# Patient Record
Sex: Female | Born: 1970 | Race: Black or African American | Hispanic: No | Marital: Single | Smoking: Never smoker
Health system: Southern US, Community
[De-identification: ages and names within clinical notes are randomized; demographics above are authoritative.]

## PROBLEM LIST (undated history)

## (undated) DIAGNOSIS — K219 Gastro-esophageal reflux disease without esophagitis: Secondary | ICD-10-CM

## (undated) DIAGNOSIS — N979 Female infertility, unspecified: Secondary | ICD-10-CM

## (undated) DIAGNOSIS — N189 Chronic kidney disease, unspecified: Secondary | ICD-10-CM

## (undated) DIAGNOSIS — D259 Leiomyoma of uterus, unspecified: Secondary | ICD-10-CM

## (undated) DIAGNOSIS — D219 Benign neoplasm of connective and other soft tissue, unspecified: Secondary | ICD-10-CM

## (undated) DIAGNOSIS — E559 Vitamin D deficiency, unspecified: Secondary | ICD-10-CM

## (undated) DIAGNOSIS — Z8759 Personal history of other complications of pregnancy, childbirth and the puerperium: Secondary | ICD-10-CM

## (undated) DIAGNOSIS — D649 Anemia, unspecified: Secondary | ICD-10-CM

## (undated) HISTORY — DX: Benign neoplasm of connective and other soft tissue, unspecified: D21.9

## (undated) HISTORY — PX: OTHER SURGICAL HISTORY: SHX169

## (undated) HISTORY — DX: Gastro-esophageal reflux disease without esophagitis: K21.9

## (undated) HISTORY — PX: GASTRIC BYPASS: SHX52

## (undated) HISTORY — DX: Vitamin D deficiency, unspecified: E55.9

---
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
## 2006-03-09 ENCOUNTER — Ambulatory Visit (HOSPITAL_COMMUNITY): Admission: RE | Admit: 2006-03-09 | Discharge: 2006-03-09 | Payer: Self-pay | Admitting: Family Medicine

## 2006-03-09 IMAGING — CR DG KNEE COMPLETE 4+V*L*
4 series · 4 of 4 positions shown · non-contrast
Comparison: none

CLINICAL DATA: Fall two days ago with left knee pain.
 LEFT KNEE ? 4 VIEWS:

[view not recorded (1 of 4)]
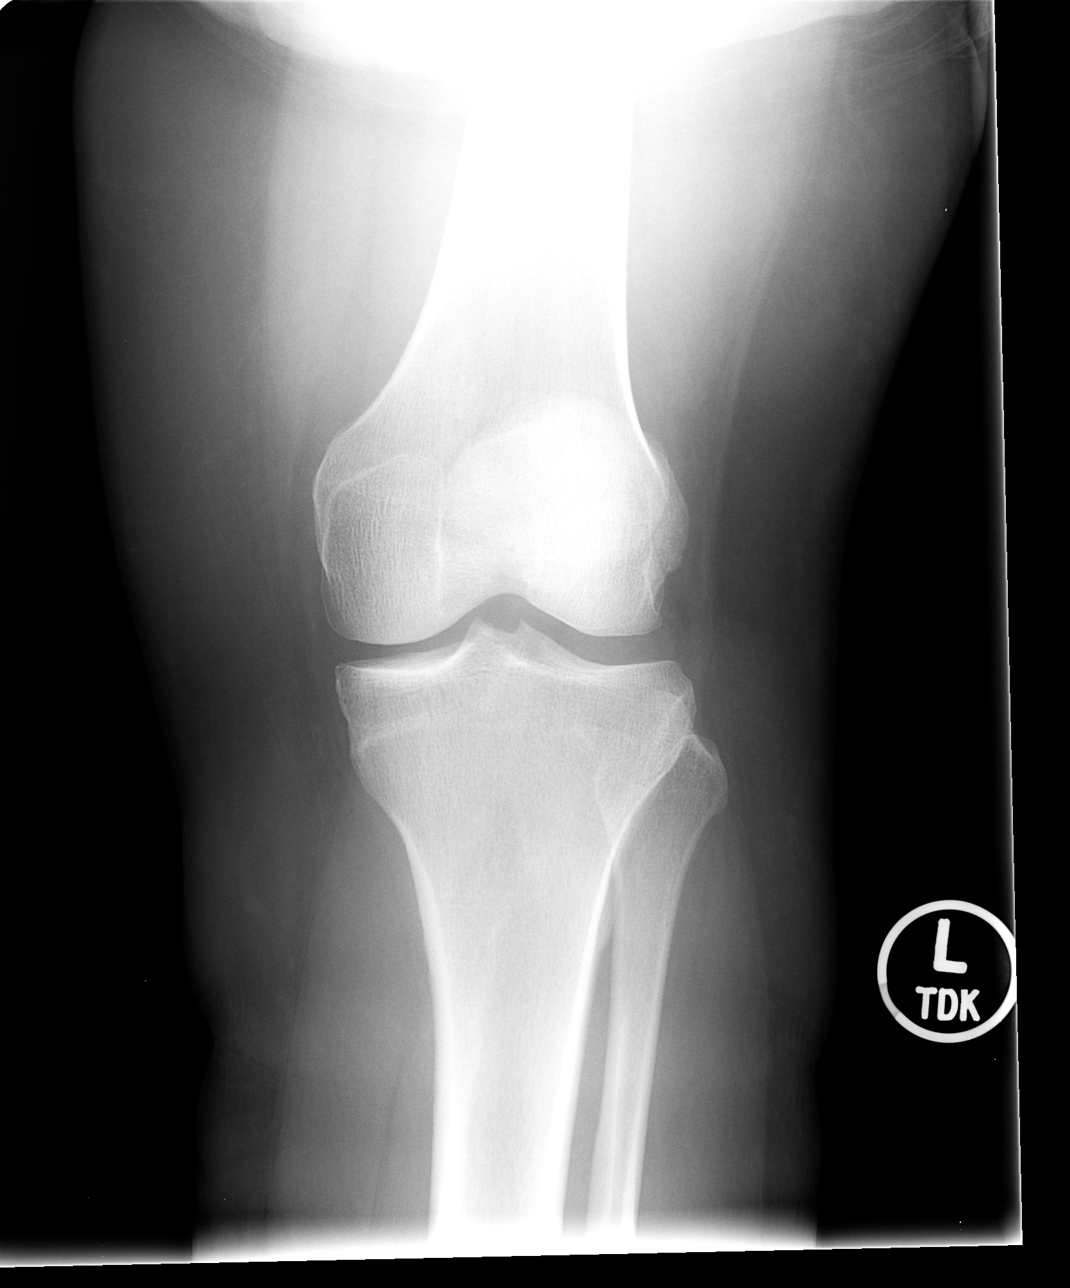

[view not recorded (2 of 4)]
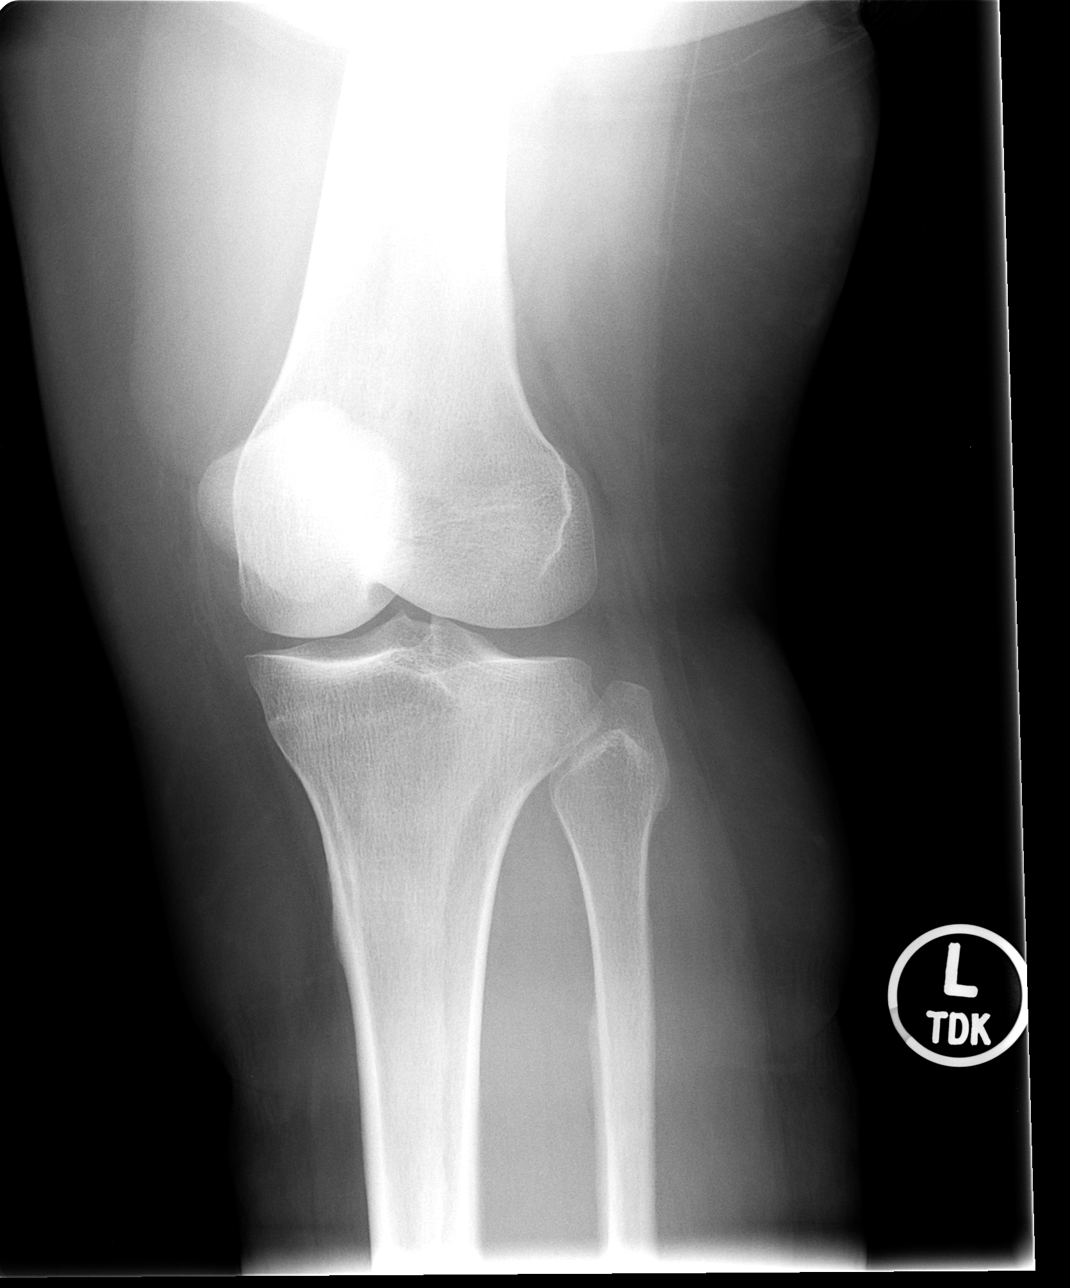

[view not recorded (3 of 4)]
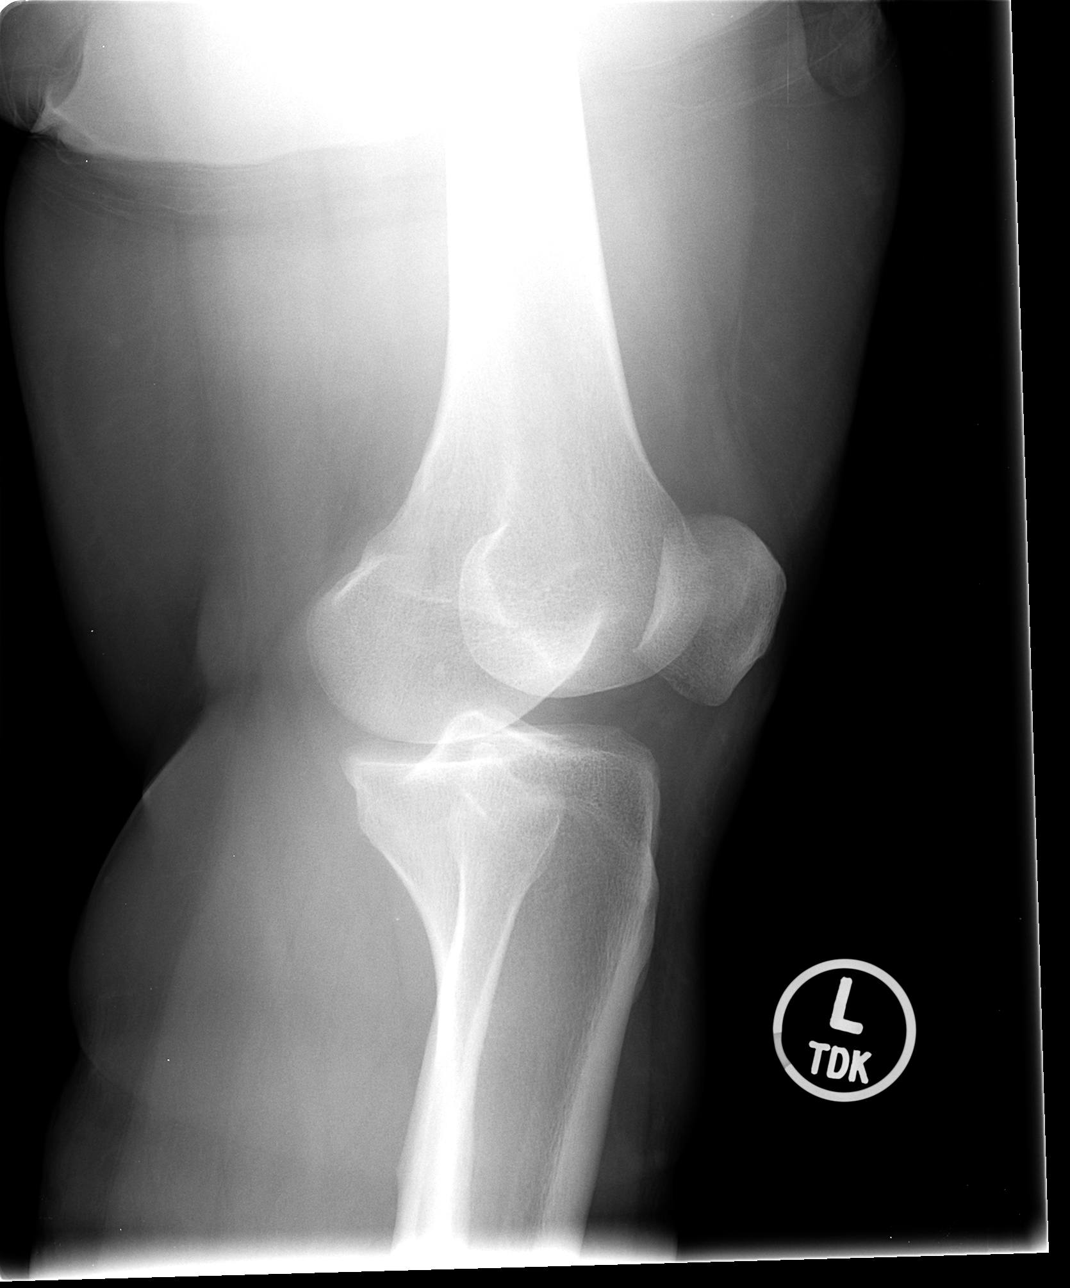

[view not recorded (4 of 4)]
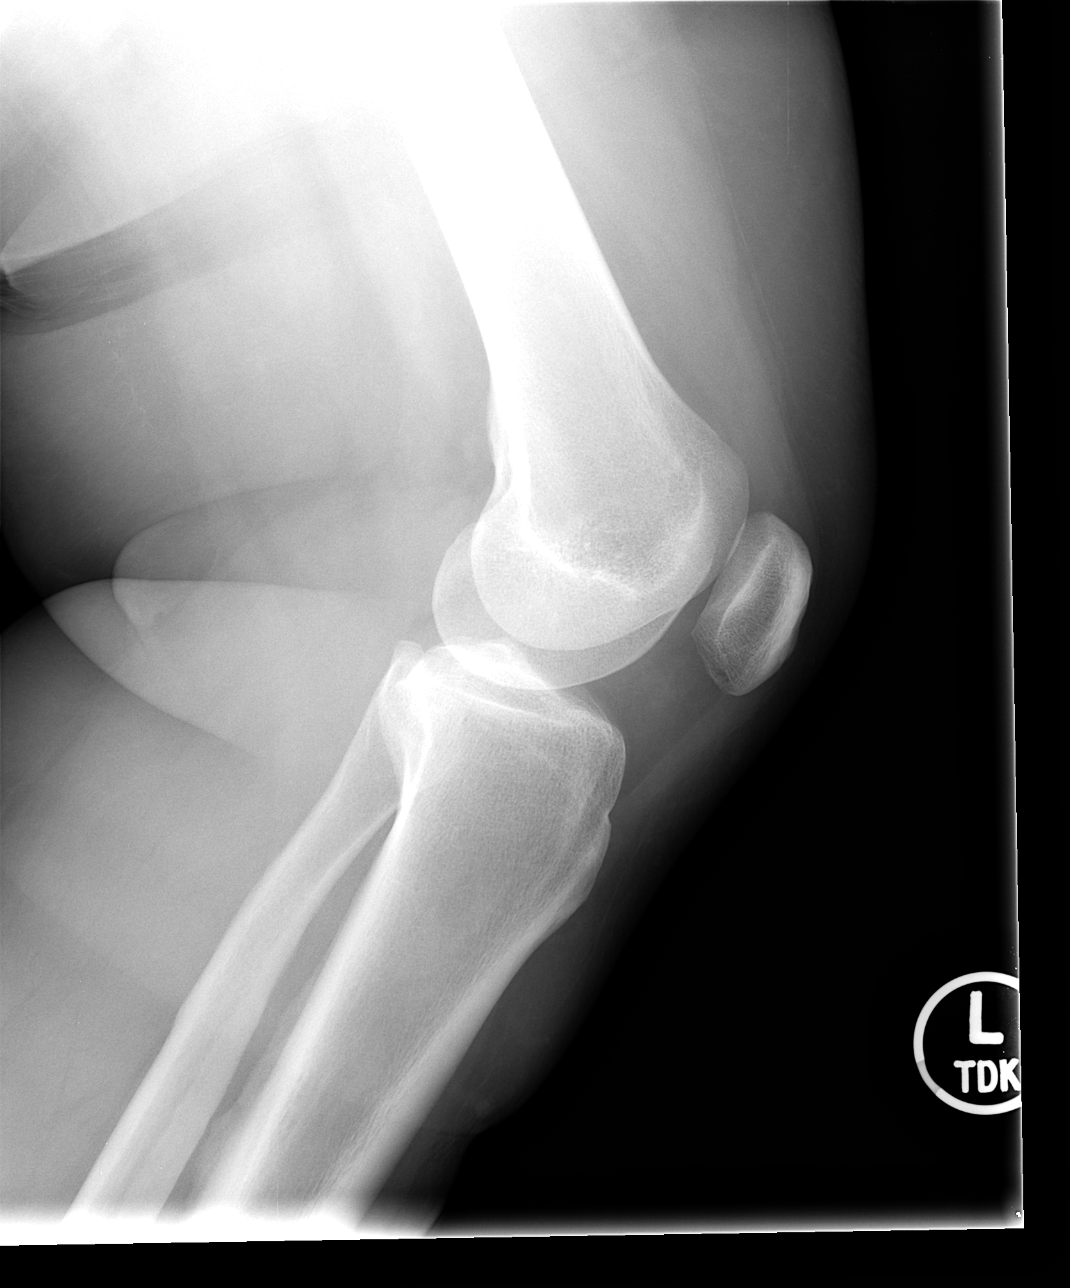

[4 of 4 positions shown; findings below may reference images not displayed]

FINDINGS: There is no evidence of fracture or dislocation.  No other soft tissue or bone abnormalities are identified.  There is no evidence of joint effusion.
IMPRESSION: Negative.

## 2009-07-20 ENCOUNTER — Inpatient Hospital Stay (HOSPITAL_COMMUNITY): Admission: AD | Admit: 2009-07-20 | Discharge: 2009-07-22 | Payer: Self-pay | Admitting: Obstetrics & Gynecology

## 2009-07-20 ENCOUNTER — Encounter: Payer: Self-pay | Admitting: Obstetrics & Gynecology

## 2009-07-20 ENCOUNTER — Encounter: Payer: Self-pay | Admitting: Emergency Medicine

## 2009-07-20 DIAGNOSIS — Z8759 Personal history of other complications of pregnancy, childbirth and the puerperium: Secondary | ICD-10-CM

## 2009-07-20 HISTORY — DX: Personal history of other complications of pregnancy, childbirth and the puerperium: Z87.59

## 2009-07-20 IMAGING — CR DG ABD PORTABLE 1V
2 series · 2 of 2 positions shown · non-contrast
Comparison: Ultrasound [DATE]

CLINICAL DATA: Ectopic pregnancy

ABDOMEN - 1 VIEW

[view not recorded (1 of 2)]
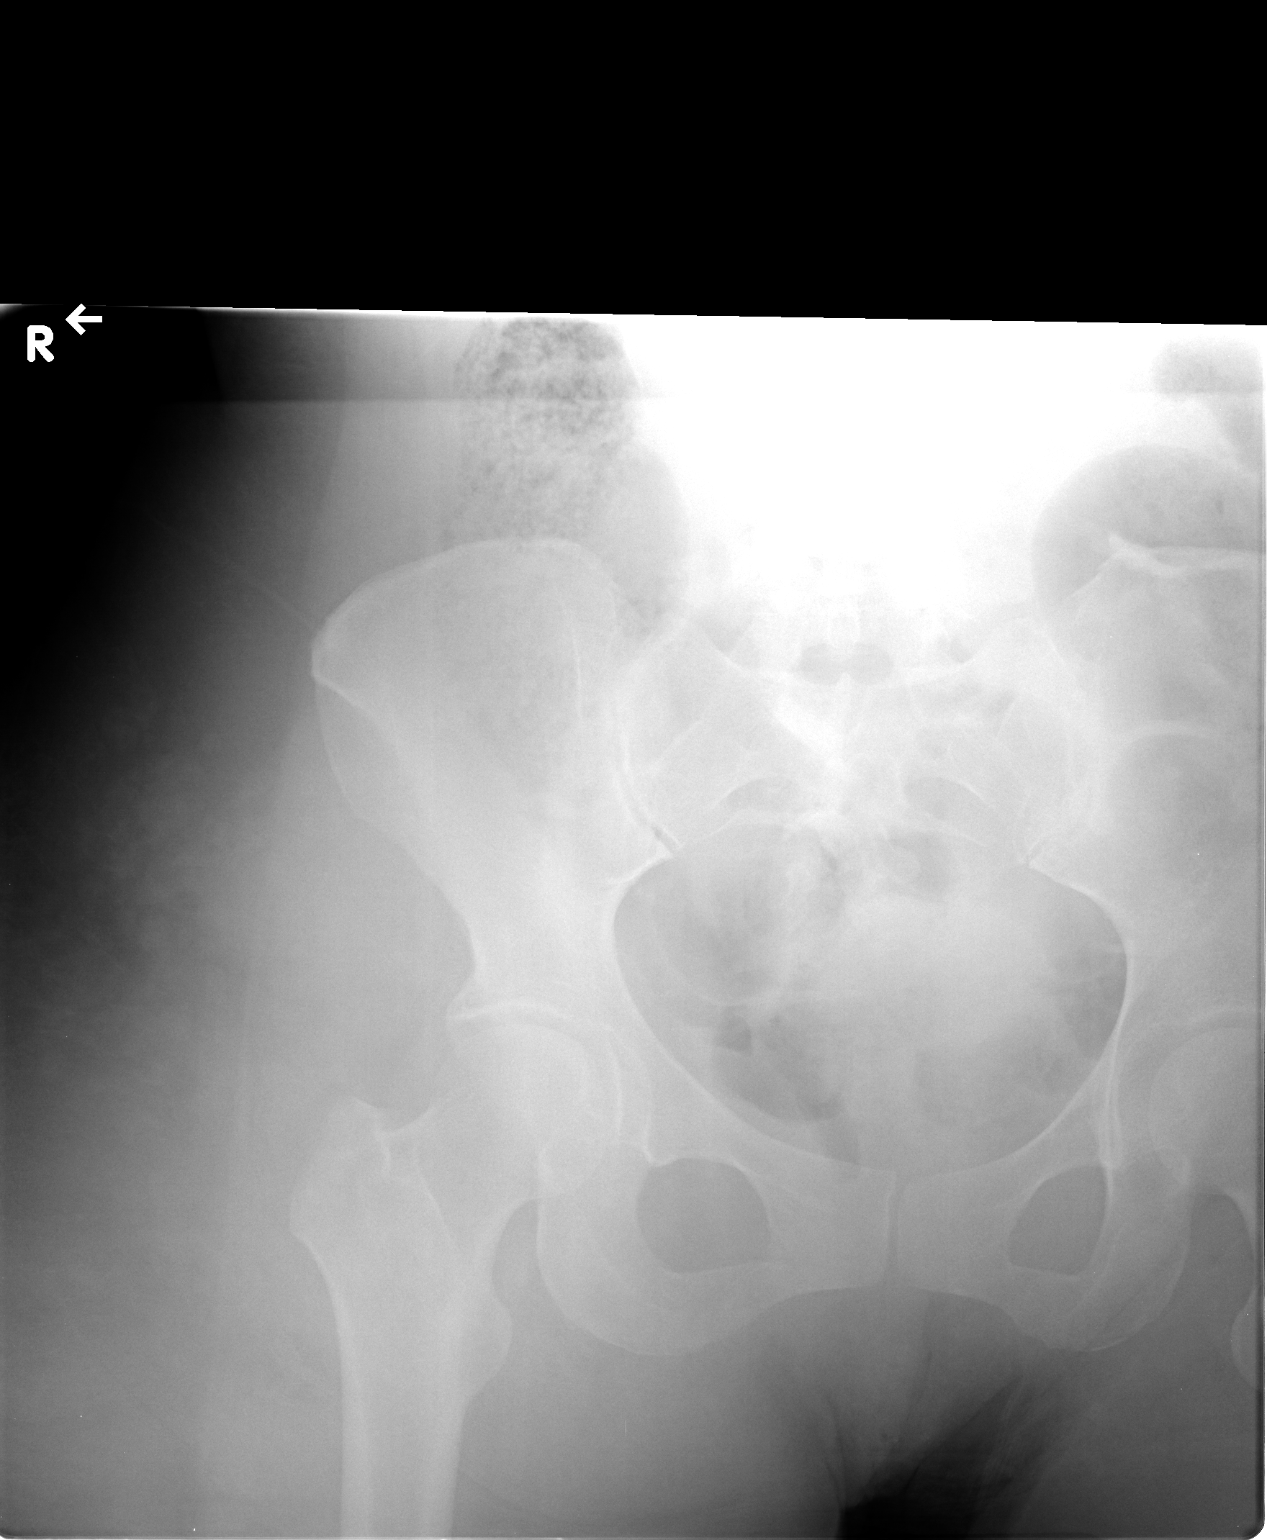

[view not recorded (2 of 2)]
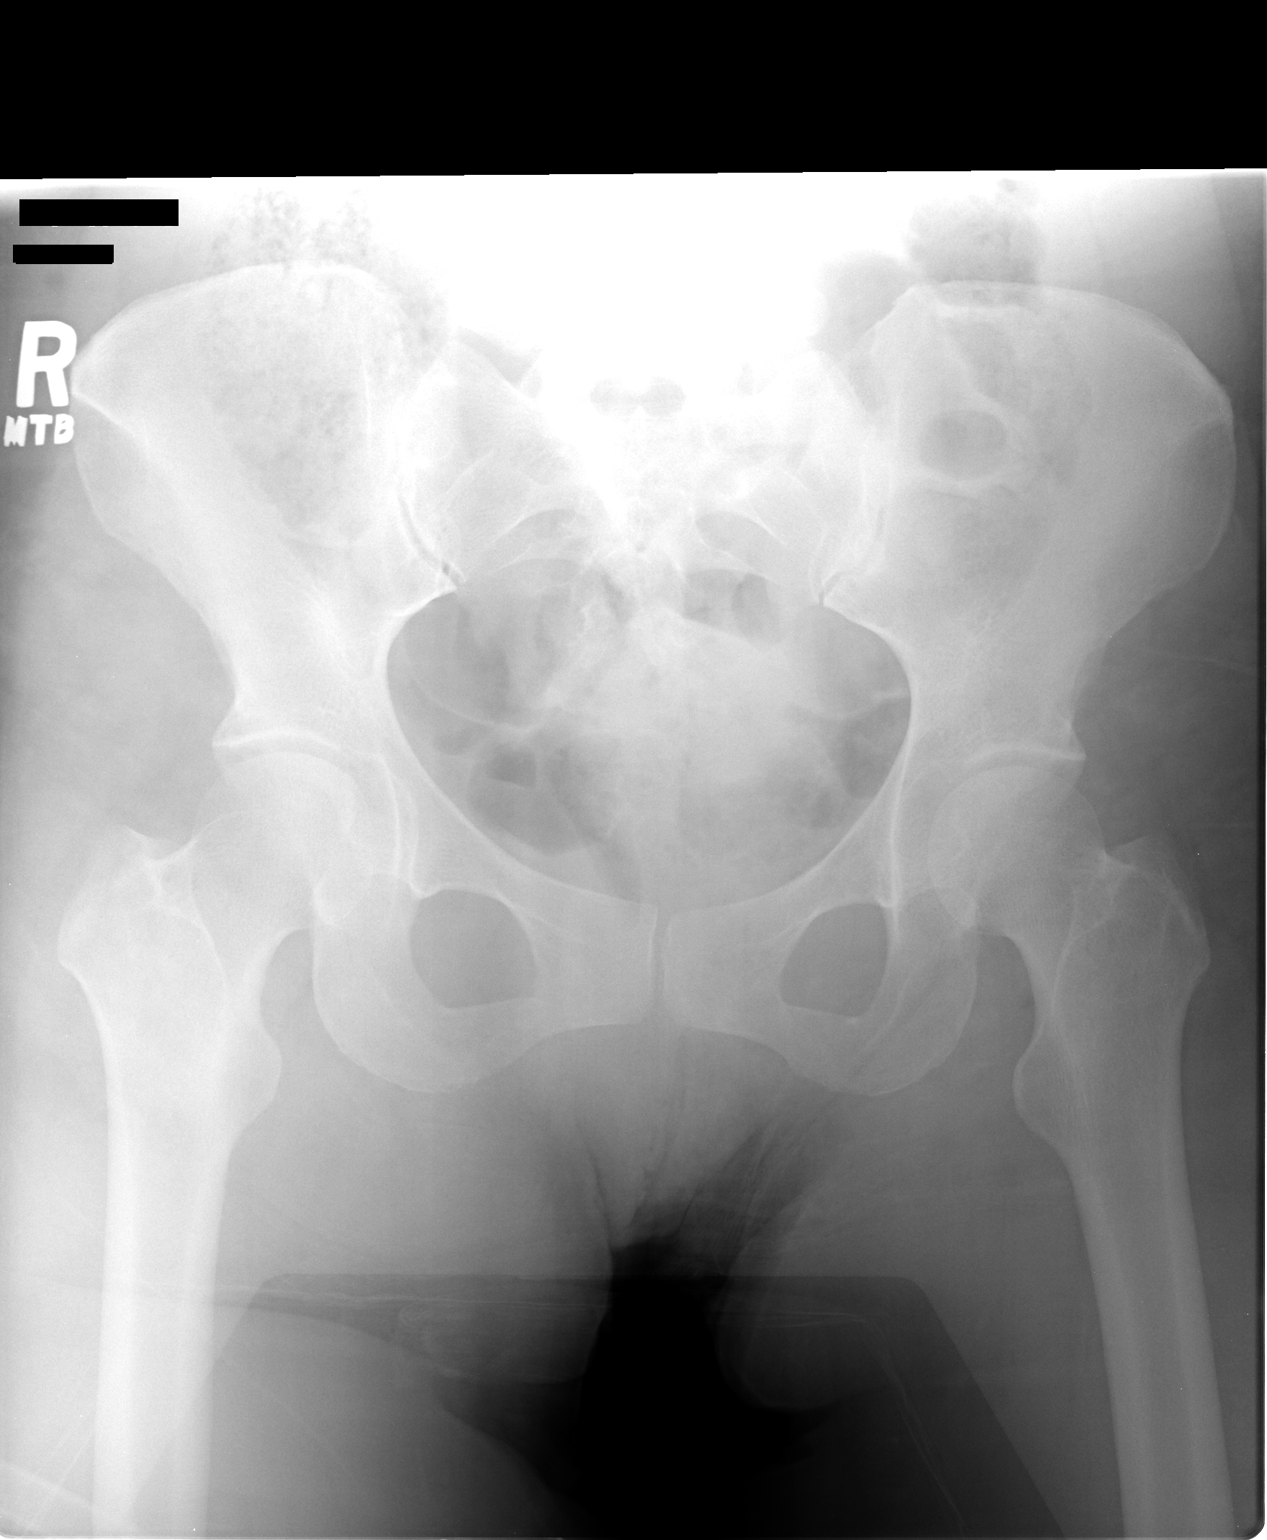

[2 of 2 positions shown; findings below may reference images not displayed]

FINDINGS: There are no radiodense foreign bodies or surgical
instruments within the pelvis.
IMPRESSION: No evidence of retained surgical instruments within the pelvis.

## 2009-07-20 IMAGING — US US OB TRANSVAGINAL
1 series · 14 of 28 positions shown · non-contrast
Comparison: Examination performed at [QQ] hours at [REDACTED]

CLINICAL DATA: Pregnant, pain, quantitative beta HCG [DATE],
question miscarriage or ectopic pregnancy

TRANSVAGINAL OB ULTRASOUND
TECHNIQUE: Transvaginal ultrasound was performed for evaluation of
the gestation as well as the maternal uterus and adnexal regions.

[Series 1: us ob comp less 14 wks · 0.12mm/px · 29 acquisitions, 14 frames shown]
[im 2/29]
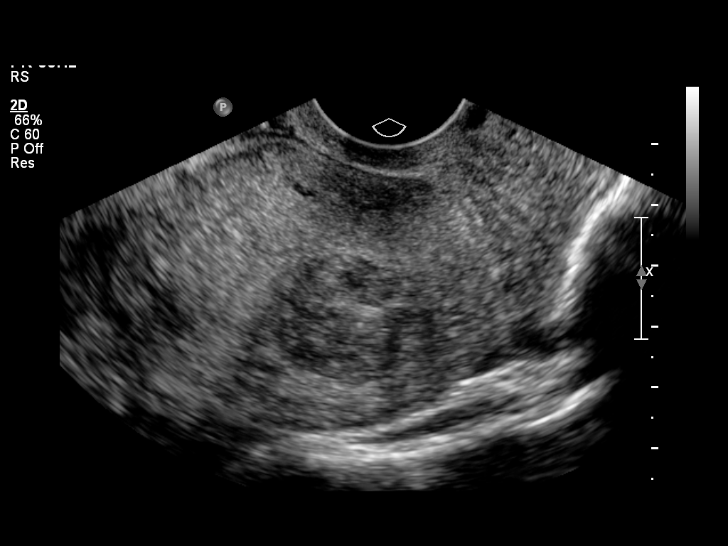
[im 4/29]
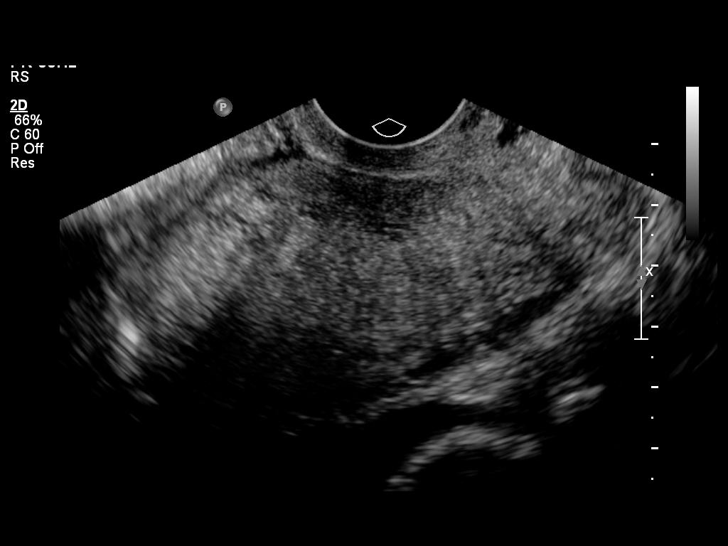
[im 6/29]
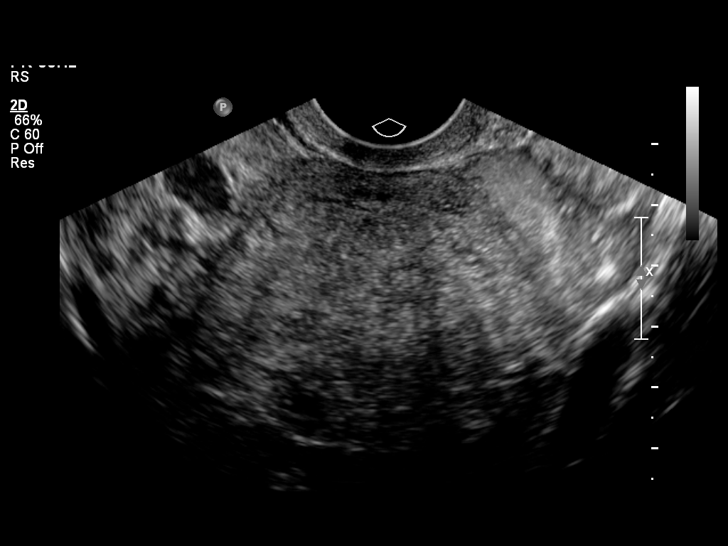
[im 8/29]
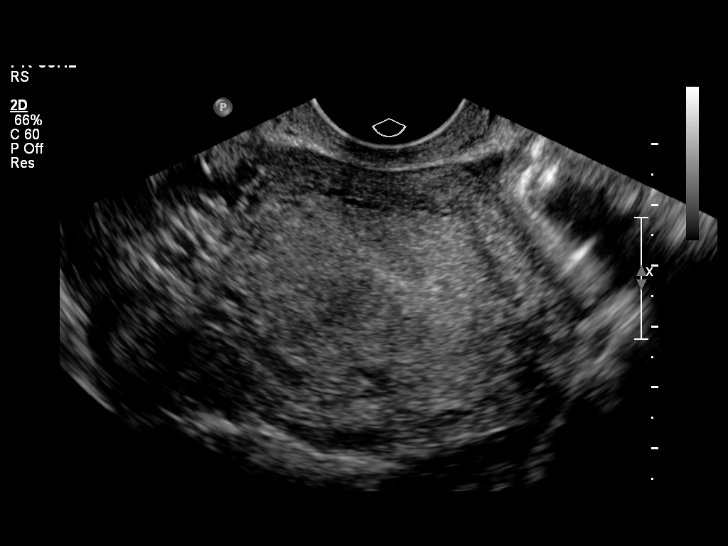
[im 10/29]
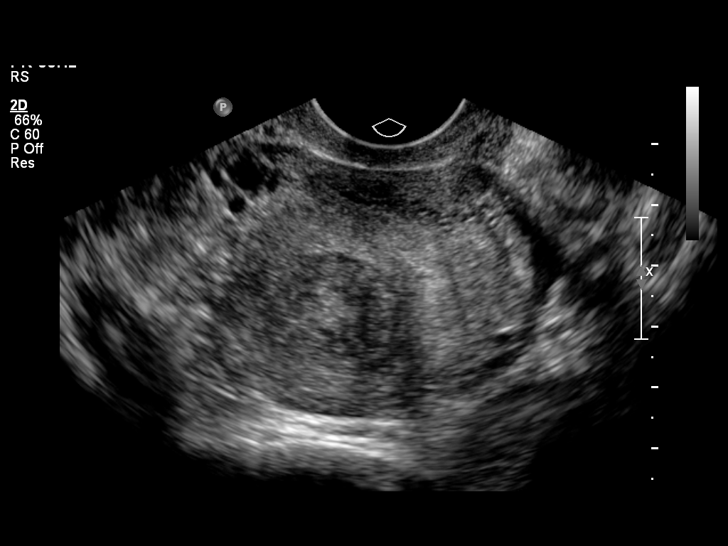
[im 12/29]
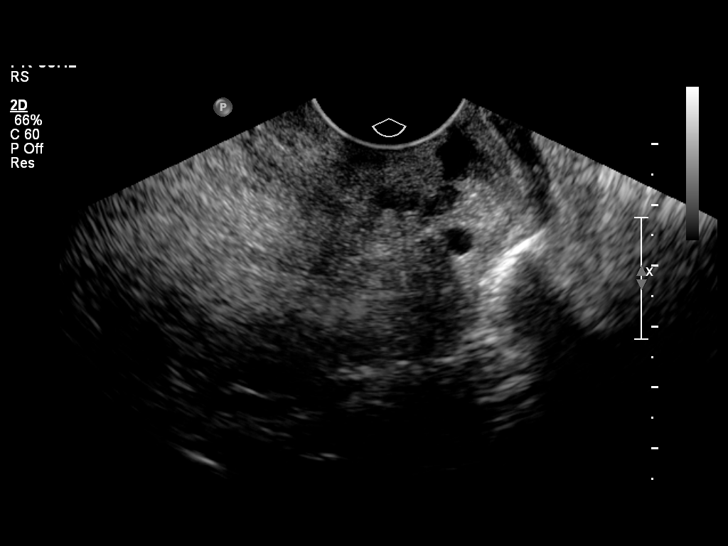
[im 14/29]
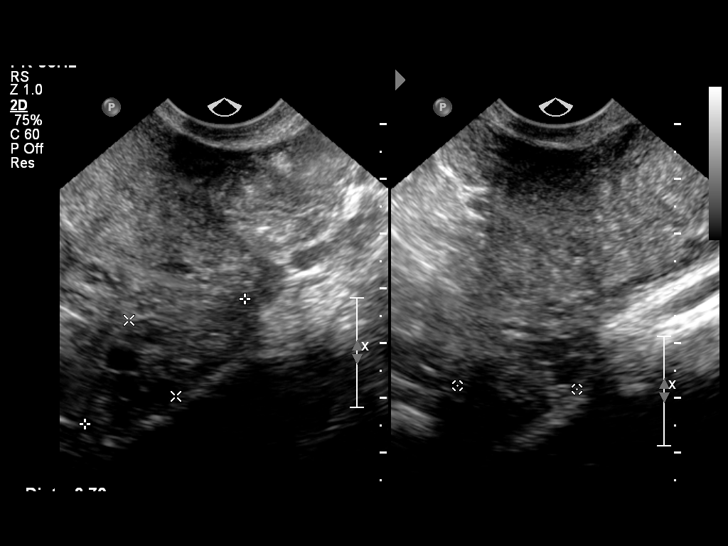
[im 16/29]
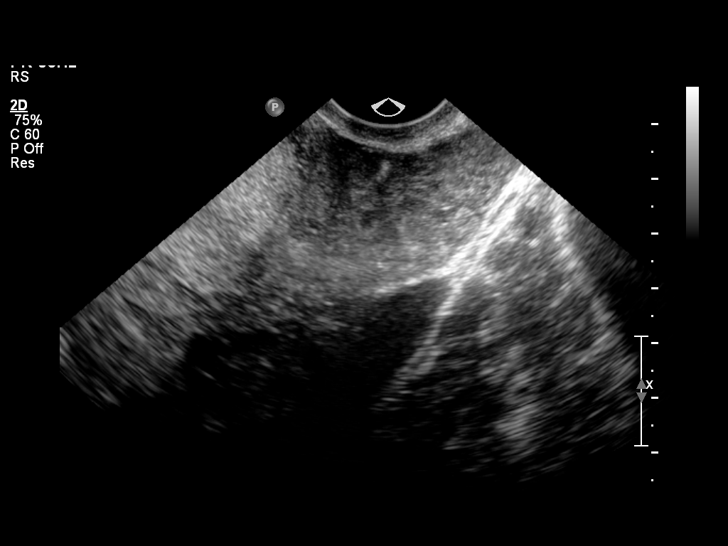
[im 18/29]
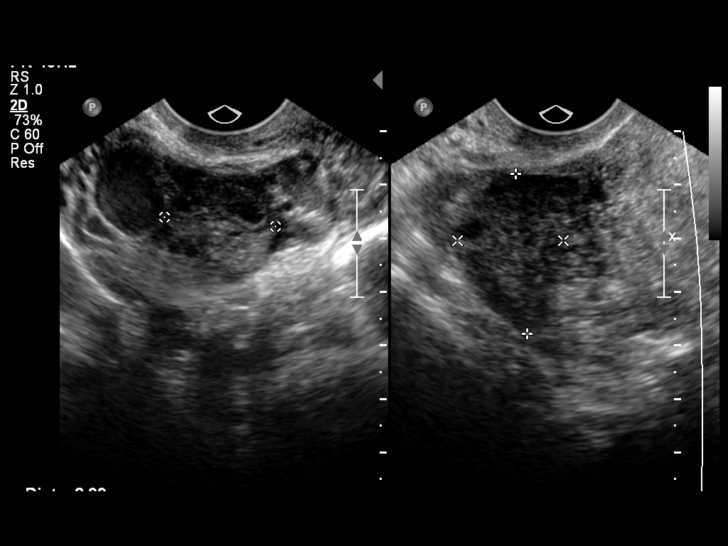
[im 20/29]
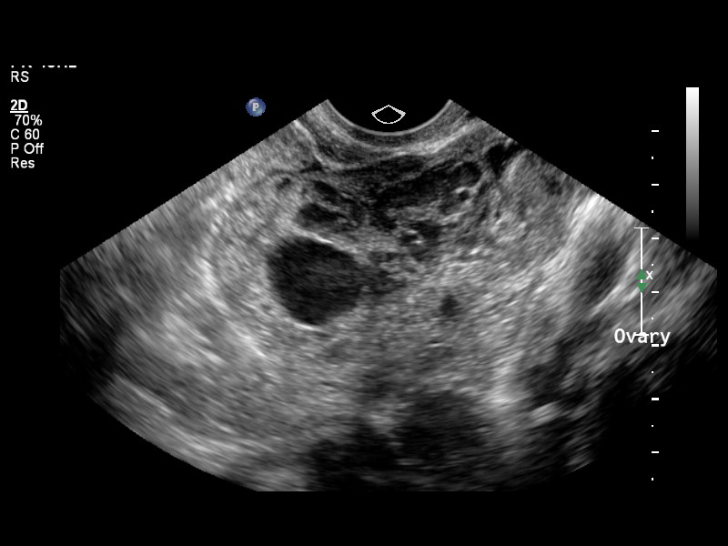
[im 22/29]
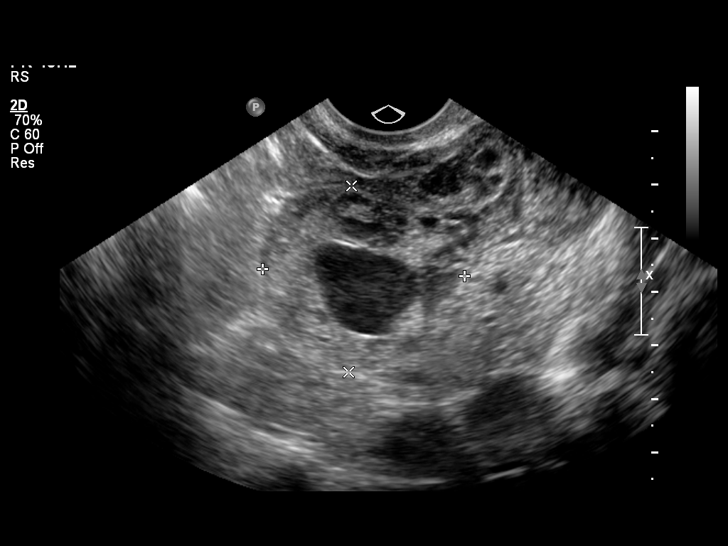
[im 24/29]
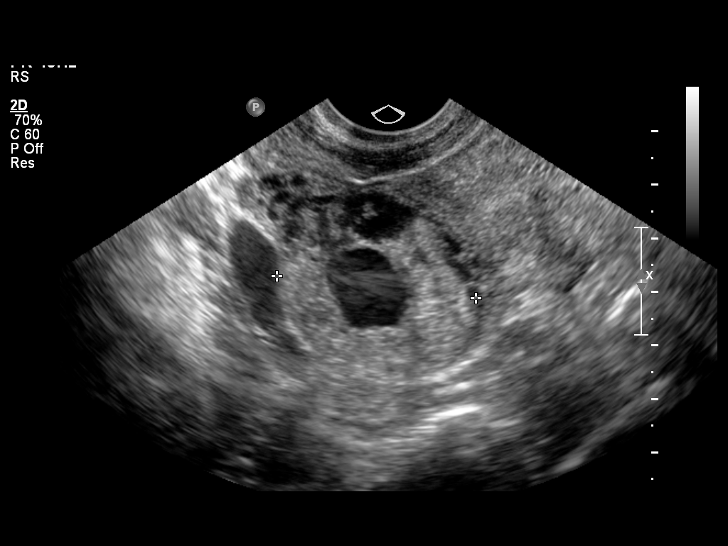
[im 26/29]
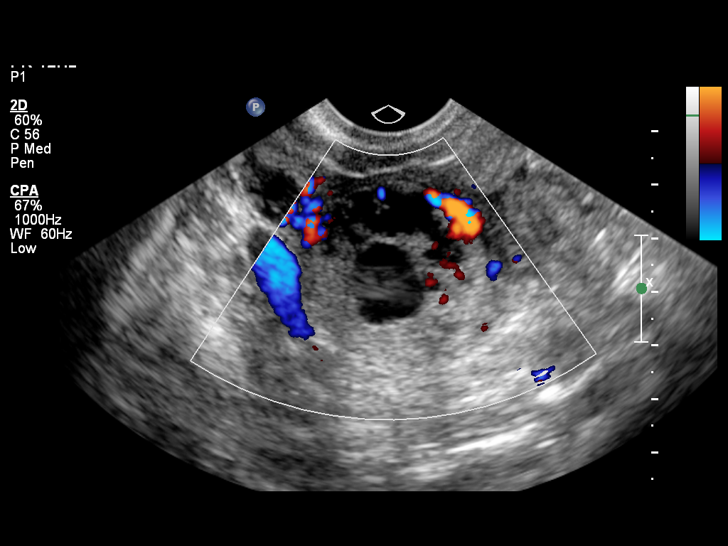
[im 29/29]
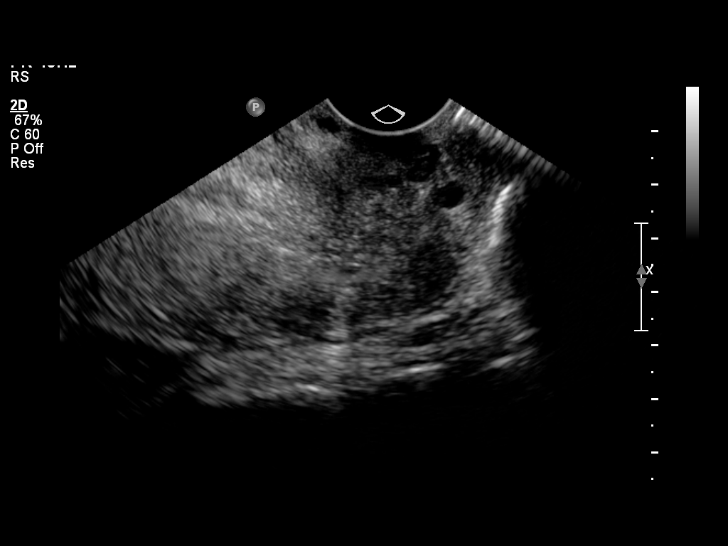

[14 of 28 positions shown; findings below may reference images not displayed]

FINDINGS: No intrauterine gestational sac identified.
Uterus is unremarkable in appearance.
No free pelvic fluid or blood.
Right ovary normal size and morphology, 3.0 x 2.0 x 2.1 cm.
Left ovary normal size and morphology, 3.7 x 1.6 x 2.2 cm.
Ring lesion identified in right adnexa, 3.8 x 3.5 x 3.0 cm.
Lesion is located superior to the right ovary and demonstrates
hypervascularity on color Doppler imaging.
Finding is highly suspicious for ectopic pregnancy in the right
adnexa.
Appearance of lesion is unchanged since prior exam.
IMPRESSION: Ring lesion in right adnexa superior to the right ovary, 3.8 cm
diameter, highly suspicious for ectopic pregnancy.
Critical test results telephoned to Dr. JOHN BATUMBER at the time

## 2009-07-20 IMAGING — US US OB TRANSVAGINAL MODIFY
1 series · 13 of 28 positions shown · non-contrast
Comparison: None.

CLINICAL DATA: Positive pregnancy test and bleeding.

OBSTETRIC <14 WK US AND TRANSVAGINAL OB US
TECHNIQUE: Both transabdominal and transvaginal ultrasound
examinations were performed for complete evaluation of the
gestation as well as the maternal uterus, adnexal regions, and
pelvic cul-de-sac.

[Series 1: us ob transvaginal modify · 0.28mm/px · 13 of 34 slices shown]
[im 2/34]
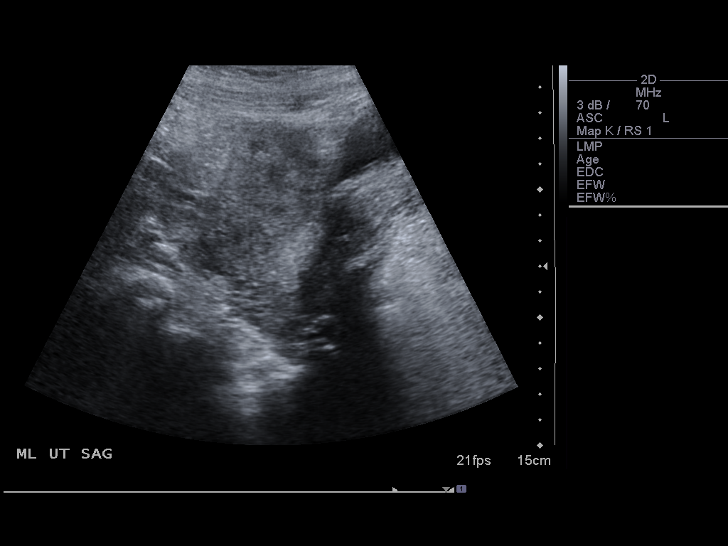
[im 4/34]
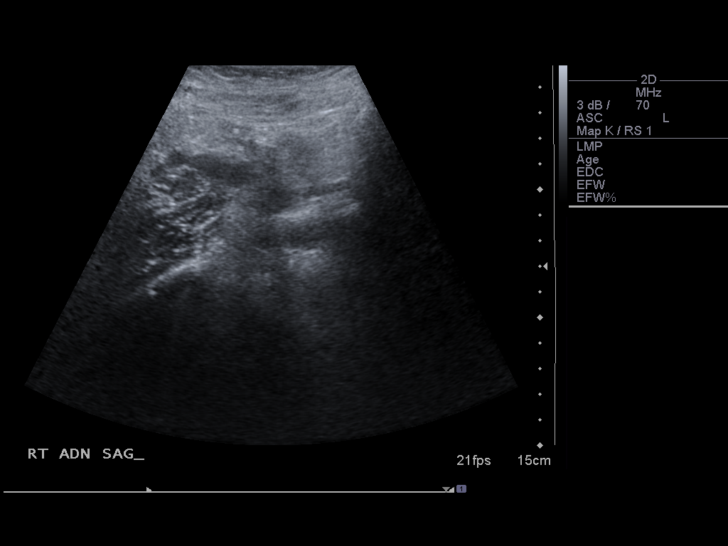
[im 7/34]
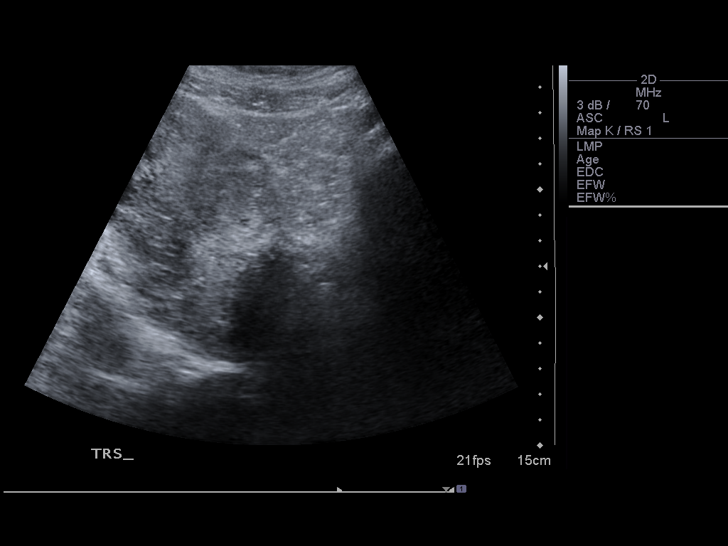
[im 9/34]
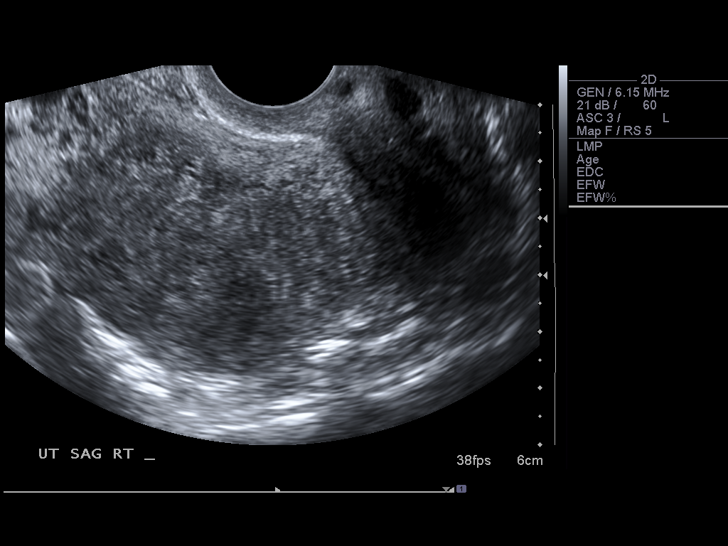
[im 12/34]
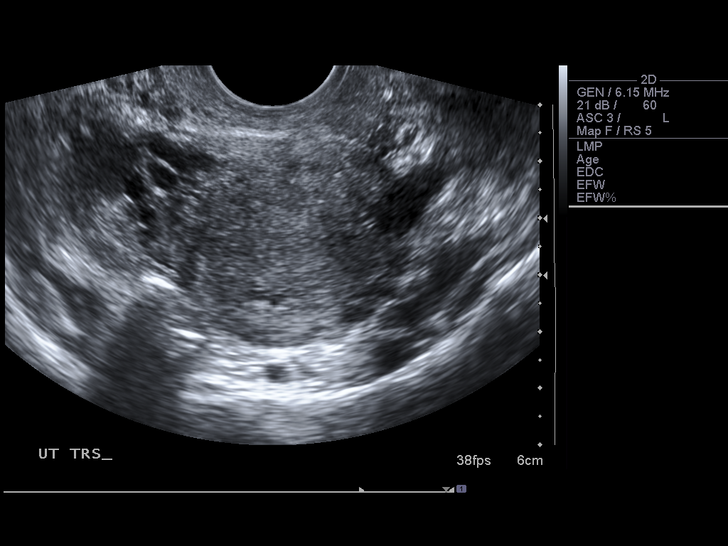
[im 14/34]
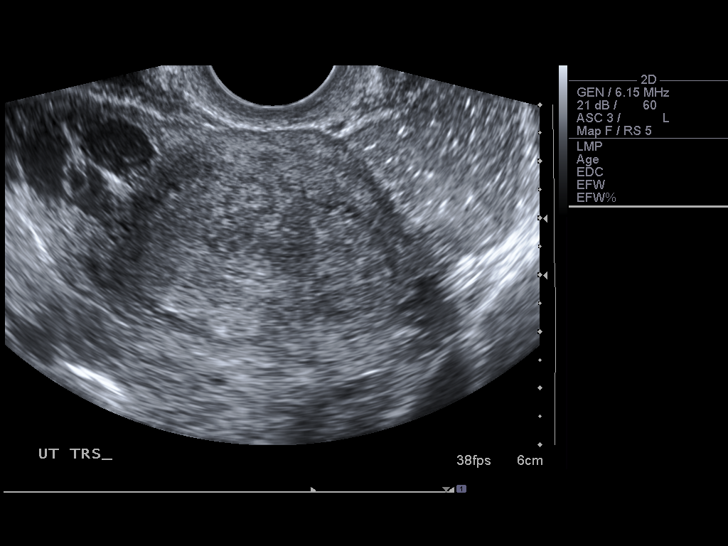
[im 18/34]
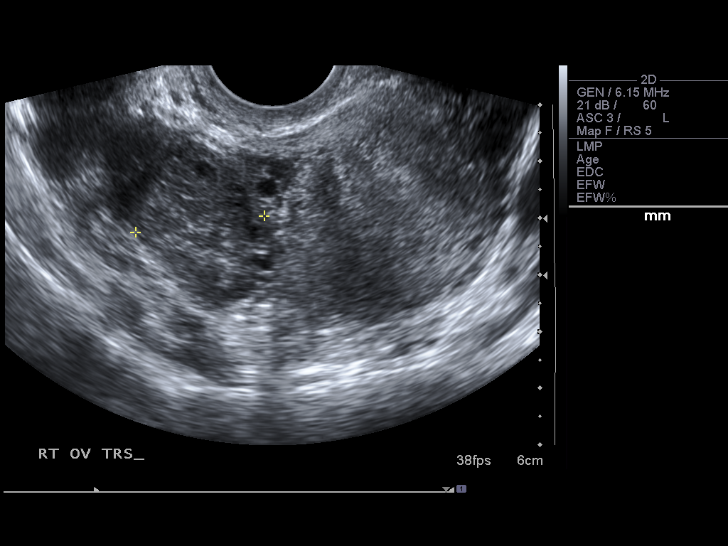
[im 20/34]
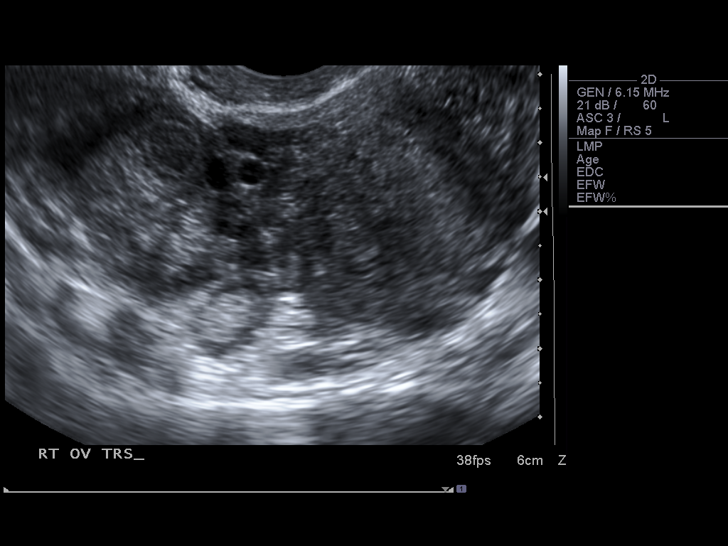
[im 23/34]
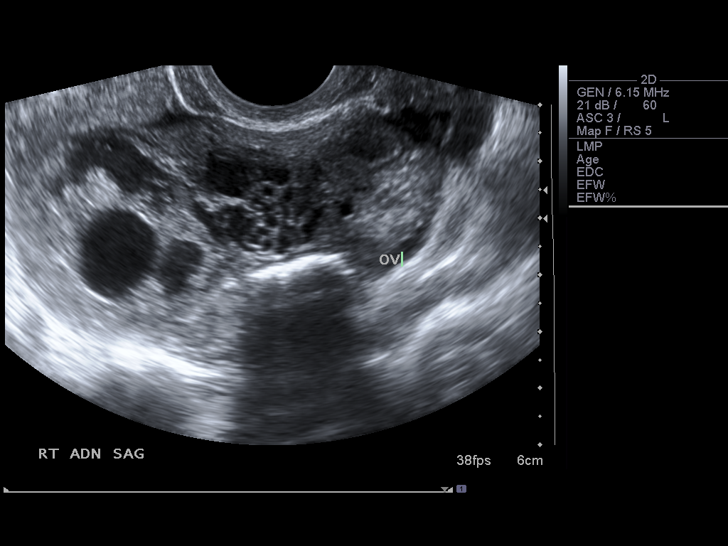
[im 25/34]
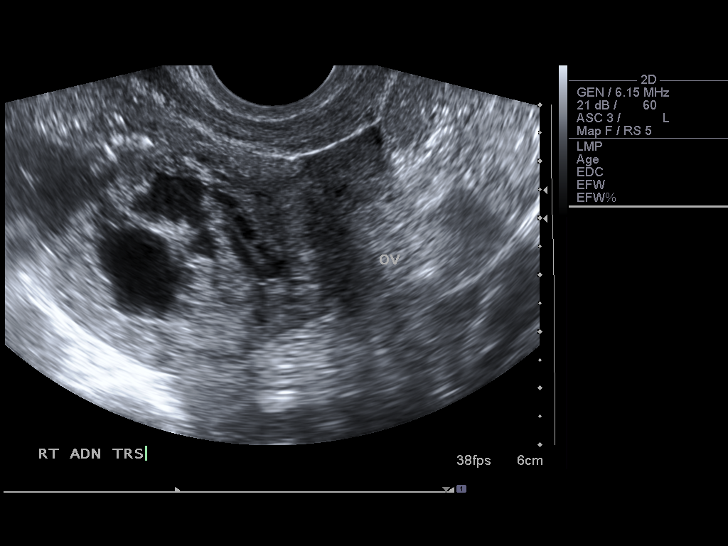
[im 27/34]
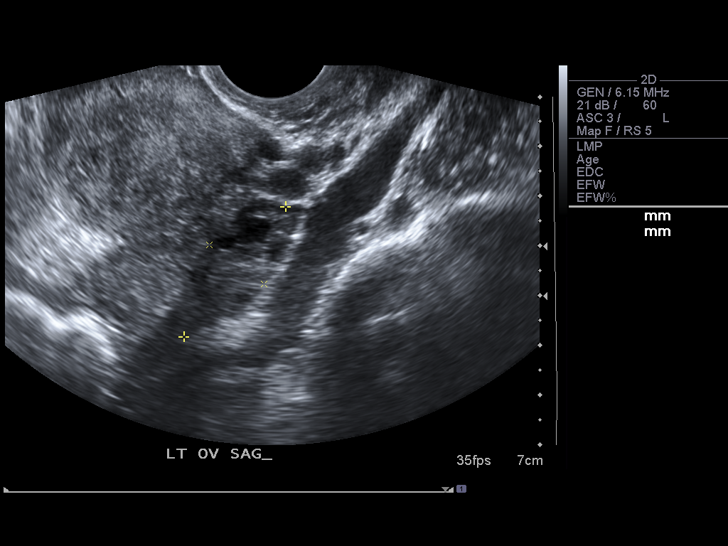
[im 30/34]
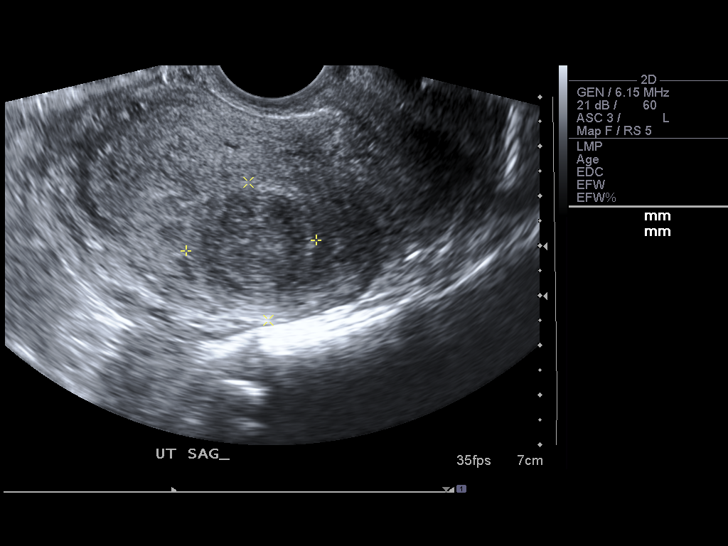
[im 32/34]
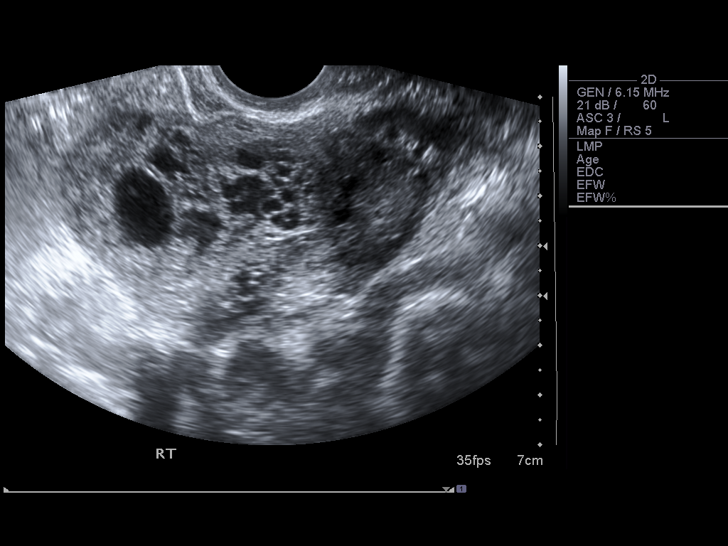

[13 of 28 positions shown; findings below may reference images not displayed]

Maternal uterus/adnexae:
No evidence for an intrauterine gestational sac.  Endometrial
stripe roughly measures 0.7 cm. Uterus echotexture is
heterogeneous.  The right ovary measures 3.3 x 2.4 x 2.3 cm.
Adjacent to the right ovary, there is a suspicious hypoechoic
collection which could represent an ectopic gestational sac.  There
may be adjacent hypoechoic collections.  The left ovary measures
3.3 x 1.4 x 1.7 cm with small follicles.  There is no significant
free fluid.  Heterogeneous structure along the posterior aspect of
the uterus measures  2.8 cm and suggestive for a fibroid.
IMPRESSION: No evidence for an intrauterine gestational sac.

Suspicious fluid or fluid collections in the right adnexa.  The
findings are concerning for an ectopic pregnancy.

Critical test results telephoned to AZUL, PA at the time
of interpretation on [DATE] at [DATE] a.m.

## 2010-04-17 ENCOUNTER — Emergency Department (HOSPITAL_COMMUNITY): Admission: EM | Admit: 2010-04-17 | Discharge: 2010-04-17 | Payer: Self-pay | Admitting: Emergency Medicine

## 2010-09-08 LAB — DIFFERENTIAL
Basophils Absolute: 0.1 10*3/uL (ref 0.0–0.1)
Lymphocytes Relative: 35 % (ref 12–46)
Monocytes Relative: 7 % (ref 3–12)
Neutro Abs: 4.1 10*3/uL (ref 1.7–7.7)
Neutrophils Relative %: 55 % (ref 43–77)

## 2010-09-08 LAB — TSH: TSH: 3.018 u[IU]/mL (ref 0.350–4.500)

## 2010-09-08 LAB — CBC
HCT: 24.1 % — ABNORMAL LOW (ref 36.0–46.0)
HCT: 29.7 % — ABNORMAL LOW (ref 36.0–46.0)
Hemoglobin: 7.6 g/dL — ABNORMAL LOW (ref 12.0–15.0)
Hemoglobin: 9.4 g/dL — ABNORMAL LOW (ref 12.0–15.0)
Hemoglobin: 9.7 g/dL — ABNORMAL LOW (ref 12.0–15.0)
MCHC: 30.4 g/dL (ref 30.0–36.0)
MCHC: 31.4 g/dL (ref 30.0–36.0)
MCHC: 31.6 g/dL (ref 30.0–36.0)
MCV: 66.9 fL — ABNORMAL LOW (ref 78.0–100.0)
MCV: 67 fL — ABNORMAL LOW (ref 78.0–100.0)
Platelets: 287 10*3/uL (ref 150–400)
Platelets: 303 10*3/uL (ref 150–400)
RDW: 18.7 % — ABNORMAL HIGH (ref 11.5–15.5)
RDW: 19 % — ABNORMAL HIGH (ref 11.5–15.5)
RDW: 19.1 % — ABNORMAL HIGH (ref 11.5–15.5)
RDW: 19.6 % — ABNORMAL HIGH (ref 11.5–15.5)

## 2010-09-08 LAB — WET PREP, GENITAL: Yeast Wet Prep HPF POC: NONE SEEN

## 2010-09-08 LAB — POCT I-STAT, CHEM 8
BUN: 13 mg/dL (ref 6–23)
Calcium, Ion: 1.07 mmol/L — ABNORMAL LOW (ref 1.12–1.32)
Chloride: 109 mEq/L (ref 96–112)
Creatinine, Ser: 0.6 mg/dL (ref 0.4–1.2)
Glucose, Bld: 87 mg/dL (ref 70–99)

## 2010-09-08 LAB — GC/CHLAMYDIA PROBE AMP, GENITAL: GC Probe Amp, Genital: NEGATIVE

## 2010-09-08 LAB — POCT PREGNANCY, URINE: Preg Test, Ur: POSITIVE

## 2013-04-06 ENCOUNTER — Inpatient Hospital Stay (HOSPITAL_COMMUNITY)
Admission: EM | Admit: 2013-04-06 | Discharge: 2013-04-08 | DRG: 812 | Disposition: A | Discharge status: Home / Self Care | Payer: 59 | Attending: Internal Medicine | Admitting: Internal Medicine

## 2013-04-06 ENCOUNTER — Encounter (HOSPITAL_COMMUNITY): Payer: Self-pay | Admitting: Emergency Medicine

## 2013-04-06 ENCOUNTER — Observation Stay (HOSPITAL_COMMUNITY): Payer: 59

## 2013-04-06 DIAGNOSIS — R609 Edema, unspecified: Secondary | ICD-10-CM | POA: Diagnosis present

## 2013-04-06 DIAGNOSIS — R42 Dizziness and giddiness: Secondary | ICD-10-CM | POA: Diagnosis present

## 2013-04-06 DIAGNOSIS — Z9884 Bariatric surgery status: Secondary | ICD-10-CM

## 2013-04-06 DIAGNOSIS — D75839 Thrombocytosis, unspecified: Secondary | ICD-10-CM

## 2013-04-06 DIAGNOSIS — D649 Anemia, unspecified: Secondary | ICD-10-CM

## 2013-04-06 DIAGNOSIS — R0600 Dyspnea, unspecified: Secondary | ICD-10-CM

## 2013-04-06 DIAGNOSIS — D509 Iron deficiency anemia, unspecified: Principal | ICD-10-CM

## 2013-04-06 DIAGNOSIS — R0989 Other specified symptoms and signs involving the circulatory and respiratory systems: Secondary | ICD-10-CM | POA: Diagnosis present

## 2013-04-06 DIAGNOSIS — R51 Headache: Secondary | ICD-10-CM | POA: Diagnosis present

## 2013-04-06 DIAGNOSIS — R079 Chest pain, unspecified: Secondary | ICD-10-CM

## 2013-04-06 DIAGNOSIS — R0789 Other chest pain: Secondary | ICD-10-CM | POA: Diagnosis present

## 2013-04-06 DIAGNOSIS — R0609 Other forms of dyspnea: Secondary | ICD-10-CM

## 2013-04-06 DIAGNOSIS — D473 Essential (hemorrhagic) thrombocythemia: Secondary | ICD-10-CM | POA: Diagnosis present

## 2013-04-06 LAB — CBC WITH DIFFERENTIAL/PLATELET
Basophils Absolute: 0.1 10*3/uL (ref 0.0–0.1)
Lymphocytes Relative: 35 % (ref 12–46)
Monocytes Relative: 4 % (ref 3–12)
Neutrophils Relative %: 57 % (ref 43–77)
Platelets: 406 10*3/uL — ABNORMAL HIGH (ref 150–400)
RBC: 4.64 MIL/uL (ref 3.87–5.11)
RDW: 22 % — ABNORMAL HIGH (ref 11.5–15.5)

## 2013-04-06 LAB — BASIC METABOLIC PANEL
BUN: 12 mg/dL (ref 6–23)
Calcium: 8.7 mg/dL (ref 8.4–10.5)
Creatinine, Ser: 0.61 mg/dL (ref 0.50–1.10)
GFR calc Af Amer: >90 mL/min (ref >90)
GFR calc non Af Amer: >90 mL/min (ref >90)
Glucose, Bld: 145 mg/dL — ABNORMAL HIGH (ref 70–99)
Sodium: 137 mEq/L (ref 135–145)

## 2013-04-06 LAB — OCCULT BLOOD, POC DEVICE: Fecal Occult Bld: NEGATIVE

## 2013-04-06 LAB — RETICULOCYTES: Retic Count, Absolute: 44.8 10*3/uL (ref 19.0–186.0)

## 2013-04-06 IMAGING — CR DG CHEST 2V
2 series · 2 of 2 positions shown · non-contrast
Comparison: None.

CLINICAL DATA: Dyspnea on exertion

EXAM:
CHEST  2 VIEW

[w chest pa]
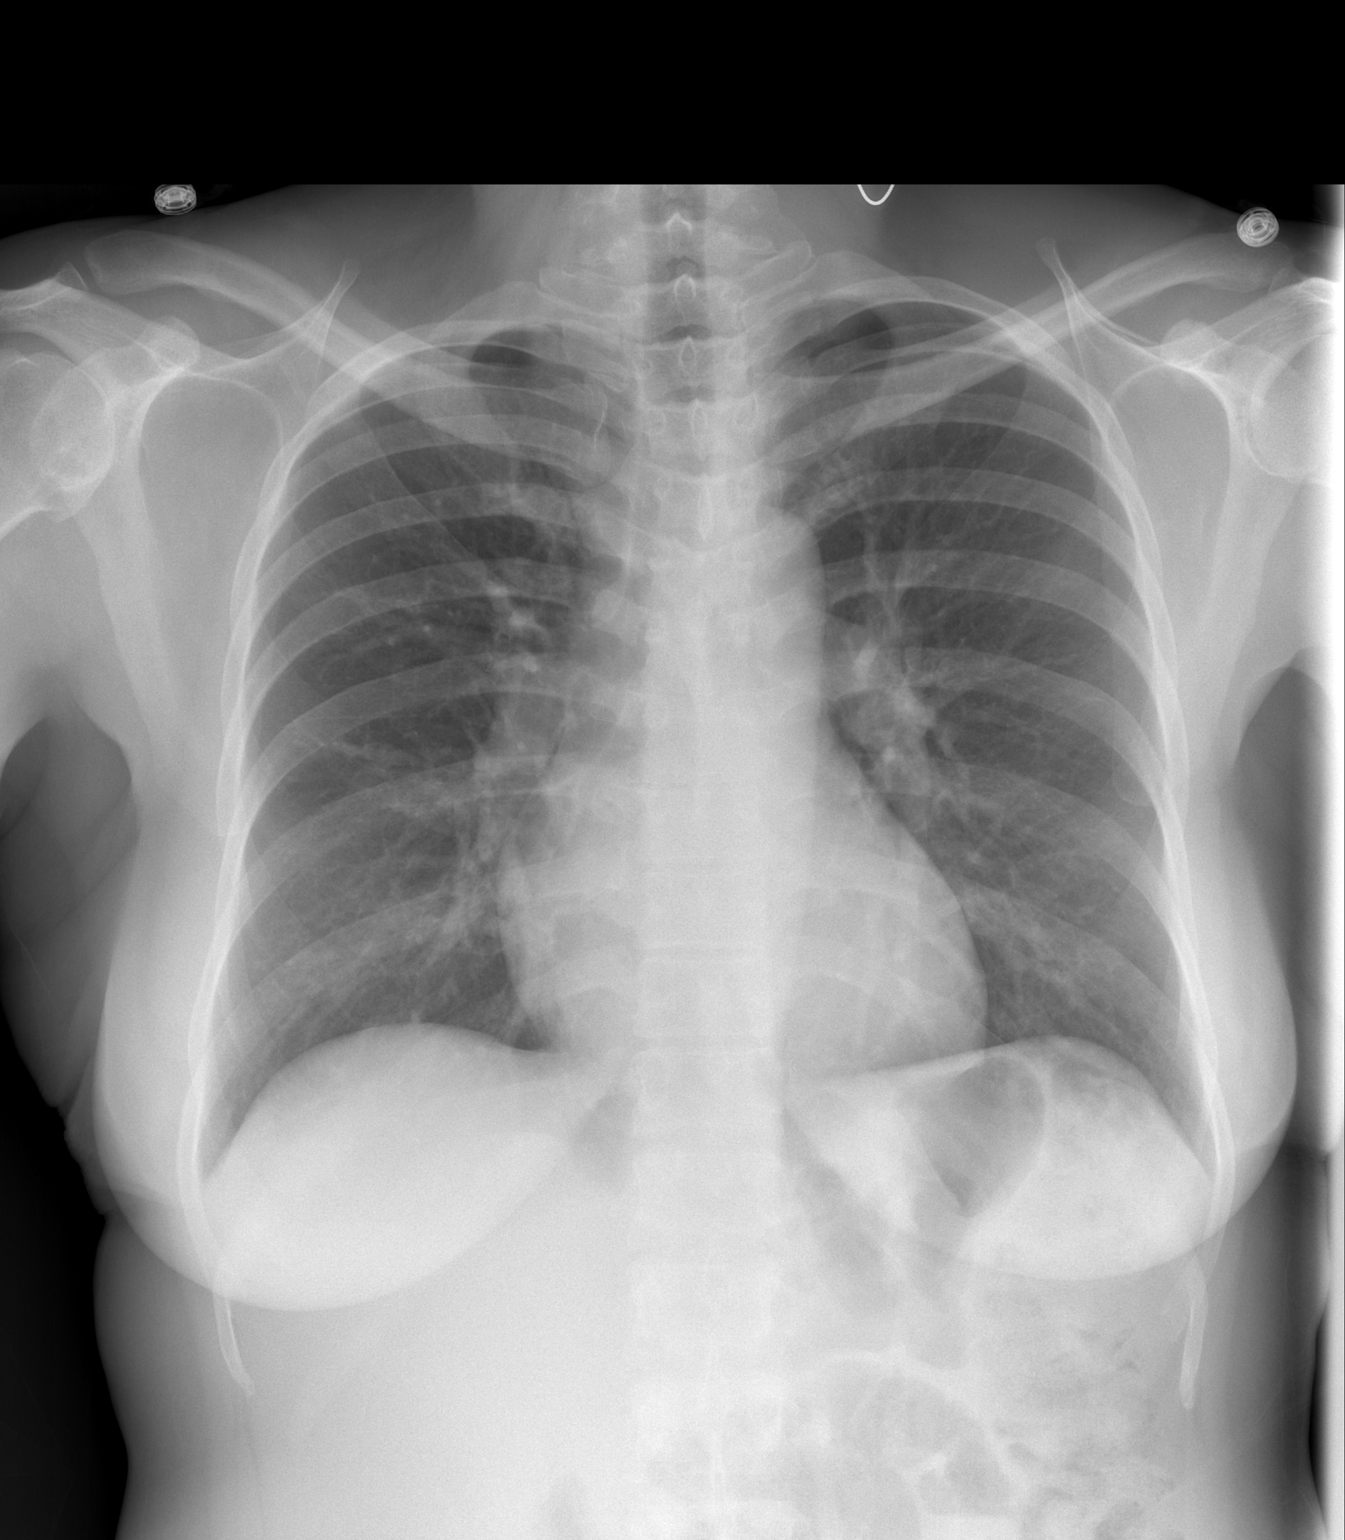

[w chest lat]
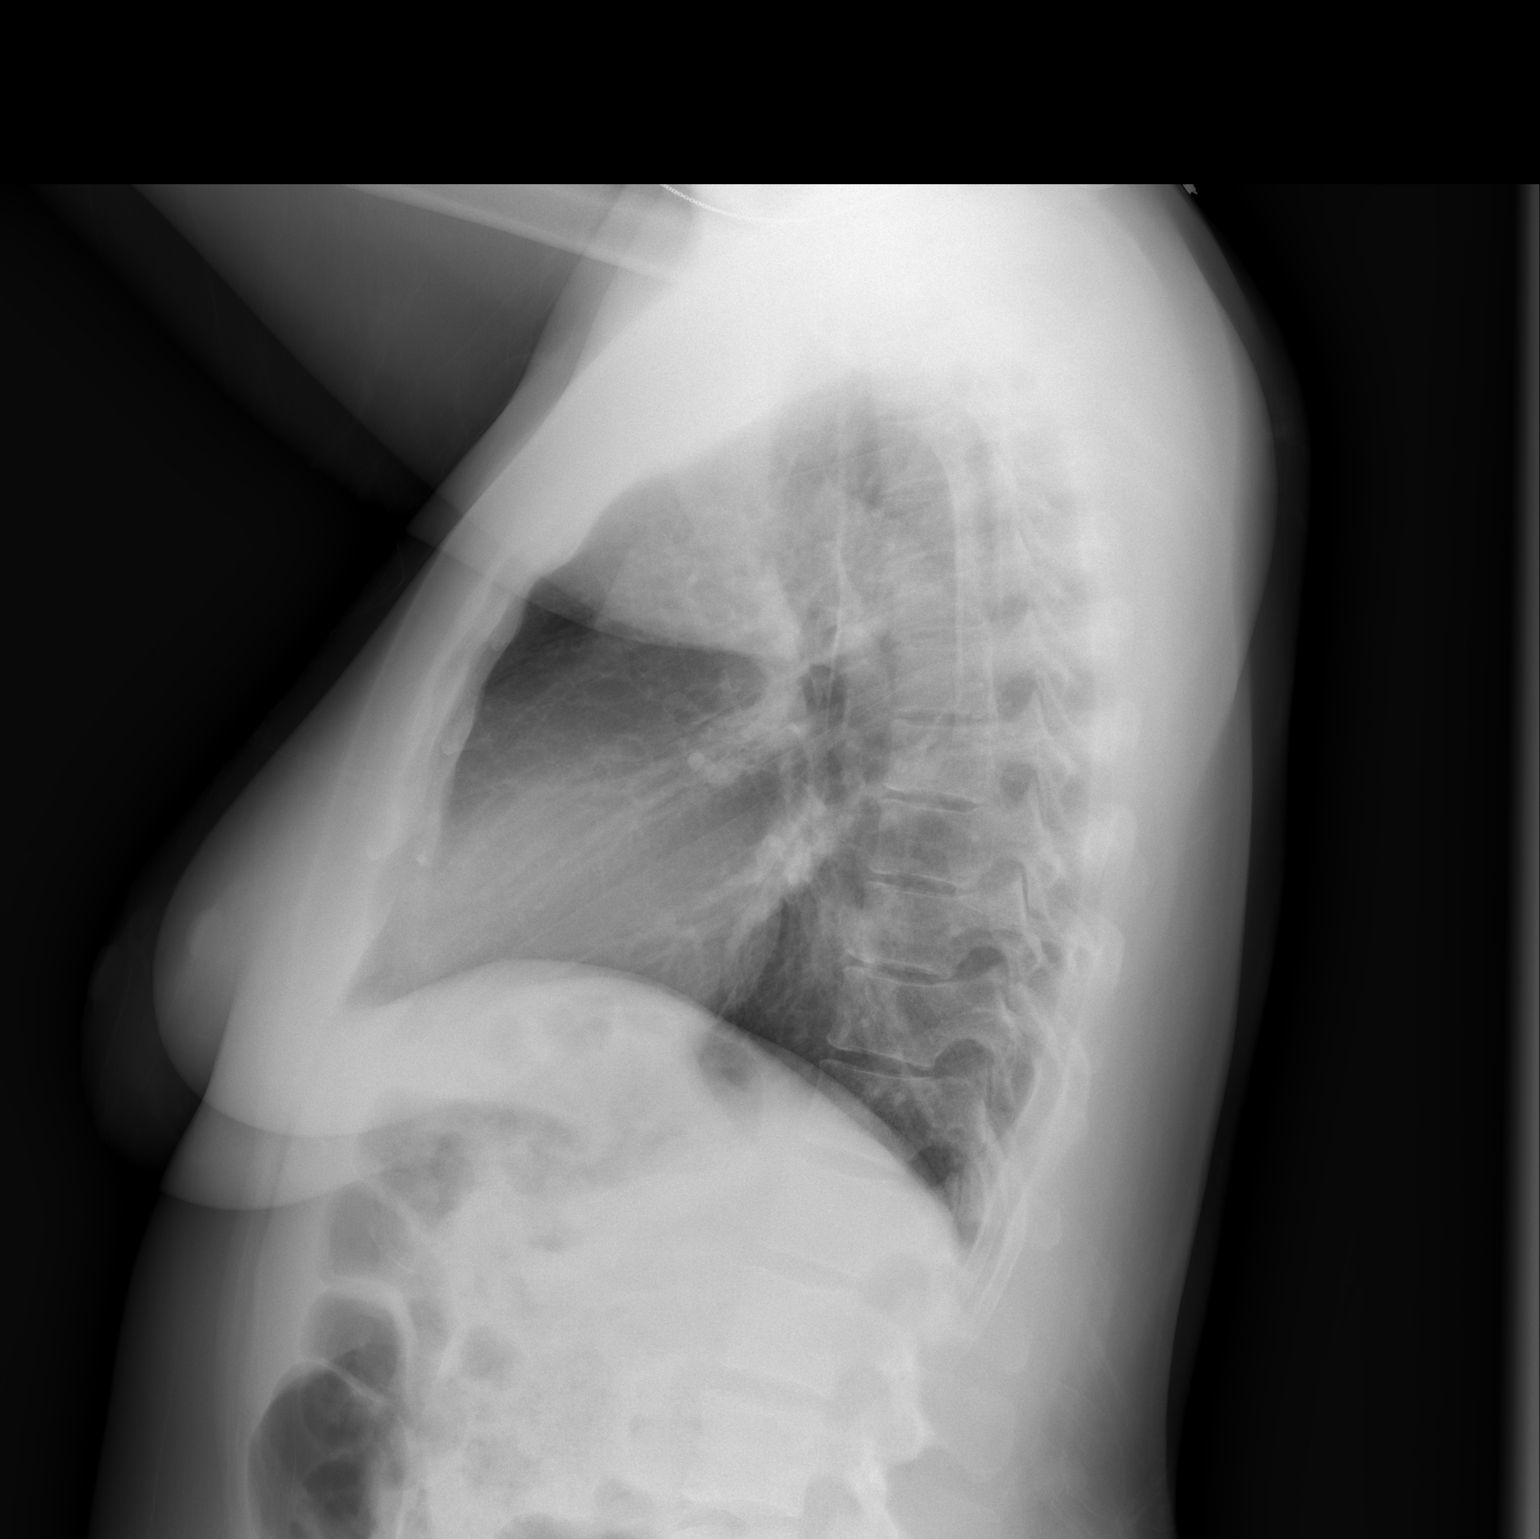

[2 of 2 positions shown; findings below may reference images not displayed]

FINDINGS: The heart size and mediastinal contours are within normal limits.
Both lungs are clear. The visualized skeletal structures are
unremarkable.
IMPRESSION: Negative chest.

## 2013-04-06 MED ORDER — FERROUS SULFATE 325 (65 FE) MG PO TABS
325.0000 mg | ORAL_TABLET | Freq: Two times a day (BID) | ORAL | Status: DC
  Administered 2013-04-07: 325 mg via ORAL
  Filled 2013-04-06 (×3): qty 1

## 2013-04-06 MED ORDER — ADULT MULTIVITAMIN W/MINERALS CH
1.0000 | ORAL_TABLET | Freq: Every day | ORAL | Status: DC
  Administered 2013-04-06 – 2013-04-07 (×2): 1 via ORAL
  Filled 2013-04-06 (×3): qty 1

## 2013-04-06 MED ORDER — ACETAMINOPHEN 650 MG RE SUPP: 650.0000 mg | Freq: Four times a day (QID) | RECTAL | Status: DC | PRN

## 2013-04-06 MED ORDER — HYDROCODONE-ACETAMINOPHEN 5-325 MG PO TABS: 1.0000 | ORAL_TABLET | ORAL | Status: DC | PRN

## 2013-04-06 MED ORDER — VITAMIN C 500 MG PO TABS
500.0000 mg | ORAL_TABLET | Freq: Two times a day (BID) | ORAL | Status: DC
  Administered 2013-04-06 – 2013-04-07 (×3): 500 mg via ORAL
  Filled 2013-04-06 (×5): qty 1

## 2013-04-06 MED ORDER — ACETAMINOPHEN 325 MG PO TABS: 650.0000 mg | ORAL_TABLET | Freq: Four times a day (QID) | ORAL | Status: DC | PRN

## 2013-04-06 MED ORDER — CALCIUM CARBONATE ANTACID 500 MG PO CHEW
2.0000 | CHEWABLE_TABLET | Freq: Three times a day (TID) | ORAL | Status: DC | PRN
  Filled 2013-04-06: qty 2

## 2013-04-06 NOTE — ED Provider Notes (Signed)
CSN: [id number]     Arrival date & time 04/06/13  1700 History   First MD Initiated Contact with Patient 04/06/13 1721     Chief Complaint  Patient presents with  . Abnormal Lab   (Consider location/radiation/quality/duration/timing/severity/associated sxs/prior Treatment) Patient is a 42 y.o. female presenting with weakness. The history is provided by the patient (the pt complains of weakness).  Weakness This is a recurrent problem. The current episode started more than 2 days ago. The problem occurs constantly. The problem has not changed since onset.Pertinent negatives include no chest pain, no abdominal pain and no headaches. Nothing aggravates the symptoms. Nothing relieves the symptoms.    History reviewed. No pertinent past medical history. Past Surgical History  Procedure Laterality Date  . Gastric bypass     No family history on file. History  Substance Use Topics  . Smoking status: Never Smoker   . Smokeless tobacco: Never Used  . Alcohol Use: No   OB History   Grav Para Term Preterm Abortions TAB SAB Ect Mult Living                 Review of Systems  Constitutional: Negative for appetite change and fatigue.  HENT: Negative for congestion, ear discharge and sinus pressure.   Eyes: Negative for discharge.  Respiratory: Negative for cough.   Cardiovascular: Negative for chest pain.  Gastrointestinal: Negative for abdominal pain and diarrhea.  Genitourinary: Negative for frequency and hematuria.  Musculoskeletal: Negative for back pain.  Skin: Negative for rash.  Neurological: Positive for weakness. Negative for seizures and headaches.  Psychiatric/Behavioral: Negative for hallucinations.    Allergies  Review of patient's allergies indicates no known allergies.  Home Medications   Current Outpatient Rx  Name  Route  Sig  Dispense  Refill  . Multiple Vitamins-Minerals (CENTRUM ULTRA WOMENS PO)   Oral   Take 2 tablets by mouth daily.         . Naproxen  Sodium (ALEVE PO)   Oral   Take 2 tablets by mouth daily as needed (headache).          BP 120/57  Pulse 80  Temp(Src) 98.2 F (36.8 C) (Oral)  Resp 18  SpO2 98%  LMP 04/01/2013 Physical Exam  Constitutional: She is oriented to person, place, and time. She appears well-developed.  HENT:  Head: Normocephalic.  Eyes: Conjunctivae and EOM are normal. No scleral icterus.  Neck: Neck supple. No thyromegaly present.  Cardiovascular: Normal rate and regular rhythm.  Exam reveals no gallop and no friction rub.   No murmur heard. Pulmonary/Chest: No stridor. She has no wheezes. She has no rales. She exhibits no tenderness.  Abdominal: She exhibits no distension. There is no tenderness. There is no rebound.  Genitourinary:  Nl,  Hem neg  Musculoskeletal: Normal range of motion. She exhibits no edema.  Lymphadenopathy:    She has no cervical adenopathy.  Neurological: She is oriented to person, place, and time. She exhibits normal muscle tone. Coordination normal.  Skin: No rash noted. No erythema.  Psychiatric: She has a normal mood and affect. Her behavior is normal.    ED Course  Procedures (including critical care time) Labs Review Labs Reviewed  CBC WITH DIFFERENTIAL - Abnormal; Notable for the following:    Hemoglobin 6.1 (*)    HCT 23.5 (*)    MCV 50.6 (*)    MCH 13.1 (*)    MCHC 26.0 (*)    RDW 22.0 (*)    Platelets  406 (*)    All other components within normal limits  BASIC METABOLIC PANEL - Abnormal; Notable for the following:    Glucose, Bld 145 (*)    All other components within normal limits  VITAMIN B12  FOLATE  IRON AND TIBC  FERRITIN  RETICULOCYTES  OCCULT BLOOD X 1 CARD TO LAB, STOOL  OCCULT BLOOD, POC DEVICE  PREPARE RBC (CROSSMATCH)  TYPE AND SCREEN   Imaging Review No results found.  EKG Interpretation   None       MDM   1. Anemia        Benny Lennert, MD 04/06/13 [phone]

## 2013-04-06 NOTE — Progress Notes (Signed)
Patient confirms she has [employer].  Patient reports her pcp is with [facility].  Her first appointment was with them today and they sent her to the ED.  Patient cannot recall the name of the physician who saw her today.

## 2013-04-06 NOTE — H&P (Signed)
[facility] History and Physical  Sherry Blair WUJ:[id number] DOB: 25-Sep-1970 DOA: 04/06/2013   PCP: Provider Not In System   Chief Complaint: dizziness and dyspnea on exertion  HPI:  42 year old female with a history of gastric bypass and chronic daily headache presents with 2 month history of dizziness and dyspnea on exertion. She states that her endurance has decreased gradually over the past 2 months to the point that she is having difficulty climbing up her 15 stairs at home. She also has some dizziness when getting up from a reclining position with episodes of near syncope. She went to see her primary care physician today where blood work revealed a hemoglobin of 5.1. As a result she was told to come to the ED for further evaluation. CBC in the emergency department confirmed a hemoglobin of 6.1. The patient states that she was diagnosed with iron deficiency anemia a couple years ago, but she quit taking ferrous sulfate one year ago due to GI intolerance and constipation. The patient denies any hematochezia, melena, dysuria, hematuria, epistaxis, hemoptysis, heavy menstrual bleeding. The patient has also been complaining of some "heartburn" that has been occurring for the past 2 weeks. She states that she has been drinking "aloe drinks" which seems to help her symptoms. The patient has also been taking 2-3 Aleve tablets per week for the past 2 months for chronic headache. He denies any alcohol or illegal drug use or tobacco.  In the ED, the patient was found to have hemoglobin 6.1, platelets 406,000. She was hemodynamically stable. BMP was unremarkable. Anemia panel was ordered, but results are pending at this time.  FOBT was neg. Assessment/Plan: Symptomatic anemia -FOBT neg -Likely due to iron deficiency, but cannot rule out other etiologies -Patient was noted to have H-J bodies on peripheral smear -As result, I will order abdominal ultrasound -Check SPEP -ED has ordered  transfusion for 2 units PRBC -Start ferrous sulfate Dyspnea on exertion -Likely related to the patient's Anemia -Echocardiogram -Chest x-ray -TSH Chest discomfort -EKG -Cycle troponins Lower extremity edema and pain -Venous duplex r/o DVT Thrombocytosis -Likely due to iron deficiency anemia       History reviewed. No pertinent past medical history. Past Surgical History  Procedure Laterality Date  . Gastric bypass     Social History:  reports that she has never smoked. She has never used smokeless tobacco. She reports that she does not drink alcohol or use illicit drugs.   Family Hx:  Family hx reviewed.  No pertinent family hx  No Known Allergies    Prior to Admission medications   Medication Sig Start Date End Date Taking? Authorizing Provider  Multiple Vitamins-Minerals (CENTRUM ULTRA WOMENS PO) Take 2 tablets by mouth daily.   Yes Historical Provider, MD  Naproxen Sodium (ALEVE PO) Take 2 tablets by mouth daily as needed (headache).   Yes Historical Provider, MD    Review of Systems:  Constitutional:  No weight loss, night sweats, Fevers, chills Head&Eyes: No headache.  No vision loss.  No eye pain or scotoma ENT:  No Difficulty swallowing,Tooth/dental problems,Sore throat,   Cardio-vascular:  No chest pain, Orthopnea, PND, swelling in lower extremities,   GI:  No  abdominal pain, nausea, vomiting, diarrhea, loss of appetite, hematochezia, melena Resp:  No shortness of breath with exertion or at rest. No cough. No coughing up of blood .No wheezing.No chest wall deformity  Skin:  no rash or lesions.  GU:  no dysuria, change in color of urine, no urgency or  frequency. No flank pain.  Musculoskeletal:  No joint pain or swelling. No decreased range of motion. No back pain.  Psych:  No change in mood or affect. No depression or anxiety. Neurologic: No headache, no dysesthesia, no focal weakness, no vision loss.   Physical Exam: Filed Vitals:    04/06/13 1707 04/06/13 1736 04/06/13 1737 04/06/13 1738  BP: 133/60 107/60 110/67 120/57  Pulse: 106 78 80 80  Temp: 98.2 F (36.8 C)     TempSrc: Oral     Resp: 18     SpO2: 98%      General:  A&O x 3, NAD, nontoxic, pleasant/cooperative Head/Eye: No conjunctival hemorrhage, no icterus, [place]/AT, No nystagmus ENT:  No icterus,  No thrush, good dentition, no pharyngeal exudate Neck:  No masses, no lymphadenpathy, no bruits CV:  RRR, no rub, no gallop, no S3 Lung:  CTAB, good air movement, no wheeze, no rhonchi Abdomen: soft/NT, +BS, nondistended, no peritoneal signs Ext: No cyanosis, No rashes, No petechiae, No lymphangitis, 1+LE edema Neuro: CNII-XII intact, strength 4-/5 in bilateral upper and lower extremities, no dysmetria  Labs on Admission:  Basic Metabolic Panel:  Recent Labs Lab 04/06/13 1745  NA 137  K 3.9  CL 105  CO2 22  GLUCOSE 145*  BUN 12  CREATININE 0.61  CALCIUM 8.7   Liver Function Tests: No results found for this basename: AST, ALT, ALKPHOS, BILITOT, PROT, ALBUMIN,  in the last 168 hours No results found for this basename: LIPASE, AMYLASE,  in the last 168 hours No results found for this basename: AMMONIA,  in the last 168 hours CBC:  Recent Labs Lab 04/06/13 1745  WBC 6.4  NEUTROABS 3.6  HGB 6.1*  HCT 23.5*  MCV 50.6*  PLT 406*   Cardiac Enzymes: No results found for this basename: CKTOTAL, CKMB, CKMBINDEX, TROPONINI,  in the last 168 hours BNP: No components found with this basename: POCBNP,  CBG: No results found for this basename: GLUCAP,  in the last 168 hours  Radiological Exams on Admission: No results found.  EKG: Independently reviewed. pending    Time spent:60 minutes Code Status:   FULL Family Communication:   Spouse at bedside   Sherry Tatham, DO  [facility] Pager [phone]  If 7PM-7AM, please contact night-coverage [email or web address] Password [email or web address] 04/06/2013, 8:04 PM

## 2013-04-06 NOTE — ED Notes (Signed)
Pt had labs drawn at her primary care office today and was told to come to the ER for abnormal labs. Pt says she was told that her hemoglobin was around 5.1.

## 2013-04-07 ENCOUNTER — Observation Stay (HOSPITAL_COMMUNITY): Payer: 59

## 2013-04-07 DIAGNOSIS — R0989 Other specified symptoms and signs involving the circulatory and respiratory systems: Secondary | ICD-10-CM

## 2013-04-07 DIAGNOSIS — R0609 Other forms of dyspnea: Secondary | ICD-10-CM

## 2013-04-07 DIAGNOSIS — M79609 Pain in unspecified limb: Secondary | ICD-10-CM

## 2013-04-07 LAB — TROPONIN I: Troponin I: <0.3 ng/mL (ref <0.30)

## 2013-04-07 LAB — COMPREHENSIVE METABOLIC PANEL
ALT: 17 U/L (ref 0–35)
AST: 23 U/L (ref 0–37)
Albumin: 3 g/dL — ABNORMAL LOW (ref 3.5–5.2)
Alkaline Phosphatase: 48 U/L (ref 39–117)
BUN: 13 mg/dL (ref 6–23)
Chloride: 105 mEq/L (ref 96–112)
GFR calc Af Amer: >90 mL/min (ref >90)
Potassium: 4 mEq/L (ref 3.5–5.1)
Sodium: 136 mEq/L (ref 135–145)
Total Bilirubin: 1.5 mg/dL — ABNORMAL HIGH (ref 0.3–1.2)
Total Protein: 6.2 g/dL (ref 6.0–8.3)

## 2013-04-07 LAB — LACTATE DEHYDROGENASE: LDH: 208 U/L (ref 94–250)

## 2013-04-07 LAB — CBC
HCT: 23.2 % — ABNORMAL LOW (ref 36.0–46.0)
HCT: 29.6 % — ABNORMAL LOW (ref 36.0–46.0)
Hemoglobin: 8.8 g/dL — ABNORMAL LOW (ref 12.0–15.0)
MCH: 16.2 pg — ABNORMAL LOW (ref 26.0–34.0)
MCV: 55.4 fL — ABNORMAL LOW (ref 78.0–100.0)
Platelets: 313 10*3/uL (ref 150–400)
RBC: 5.03 MIL/uL (ref 3.87–5.11)
RDW: 28.5 % — ABNORMAL HIGH (ref 11.5–15.5)
WBC: 5.2 10*3/uL (ref 4.0–10.5)
WBC: 6.4 10*3/uL (ref 4.0–10.5)

## 2013-04-07 LAB — IRON AND TIBC: UIBC: 433 ug/dL — ABNORMAL HIGH (ref 125–400)

## 2013-04-07 LAB — TSH: TSH: 5.543 u[IU]/mL — ABNORMAL HIGH (ref 0.350–4.500)

## 2013-04-07 LAB — FOLATE RBC: RBC Folate: 1920 ng/mL — ABNORMAL HIGH (ref >=366)

## 2013-04-07 LAB — PREPARE RBC (CROSSMATCH)

## 2013-04-07 IMAGING — US US ABDOMEN COMPLETE
1 series · 13 of 25 positions shown · non-contrast
Comparison: None.

CLINICAL DATA: Anemia.

EXAM:
ULTRASOUND ABDOMEN COMPLETE

[Series 1: us abdomen complete · 0.27mm/px · 13 of 83 slices shown]
[im 1/83]
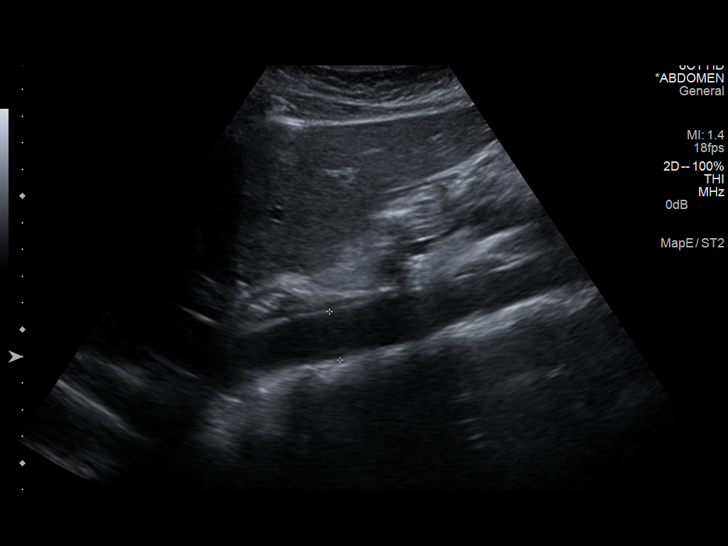
[im 7/83]
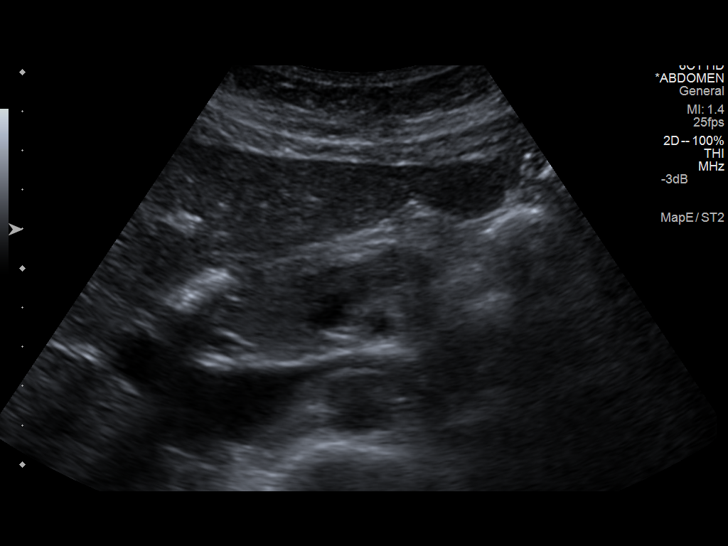
[im 14/83]
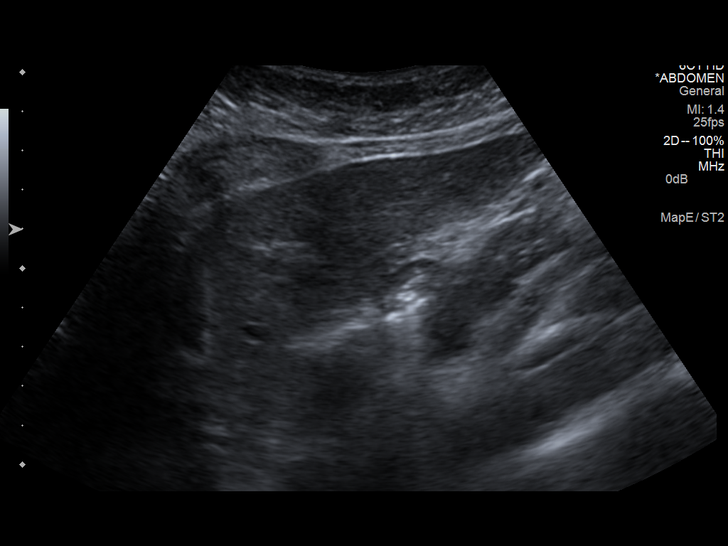
[im 21/83]
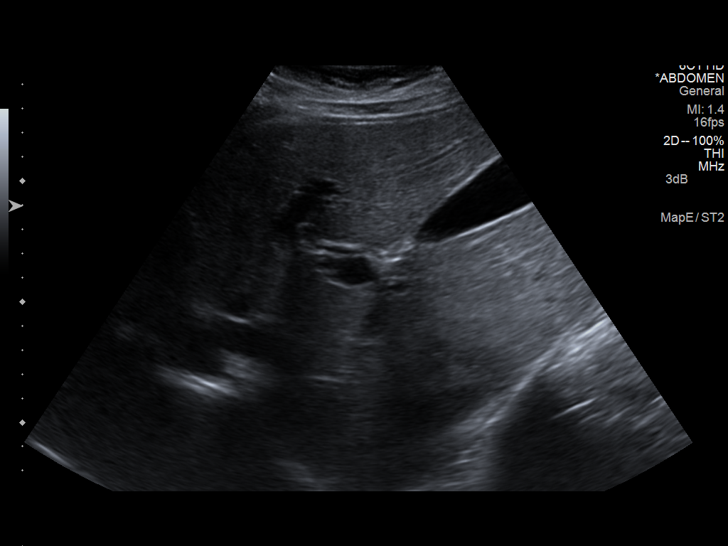
[im 28/83]
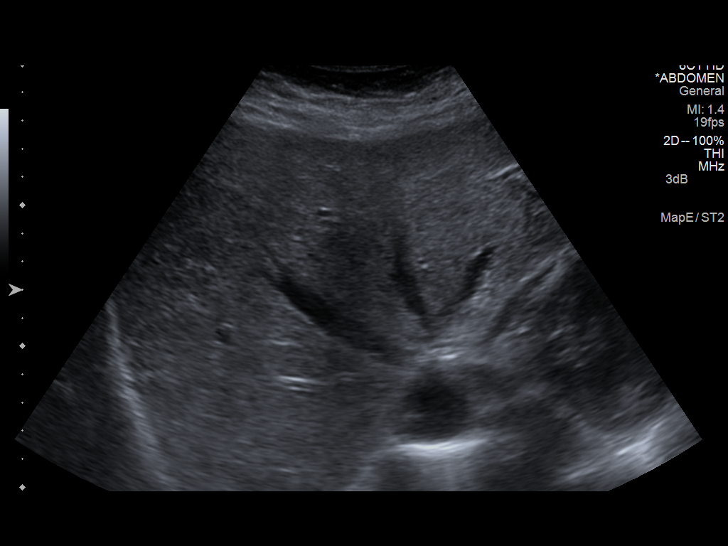
[im 35/83]
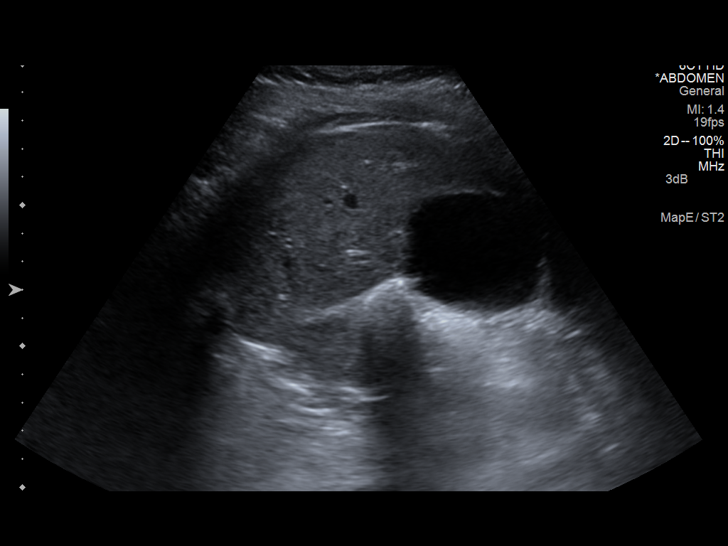
[im 42/83]
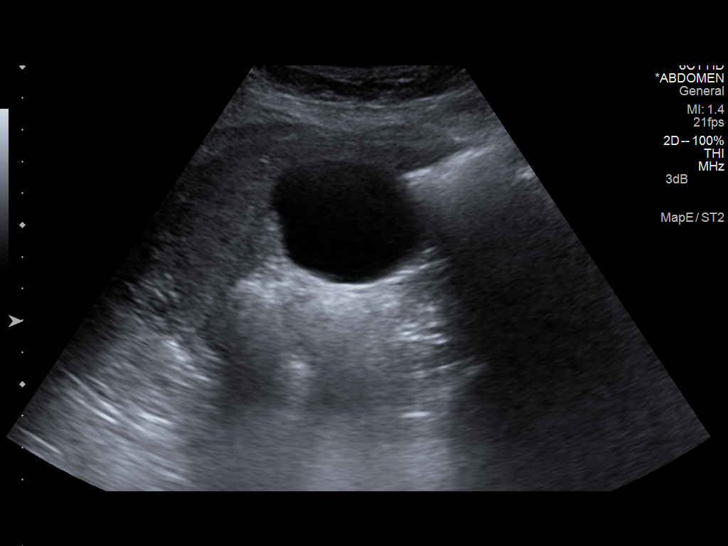
[im 48/83]
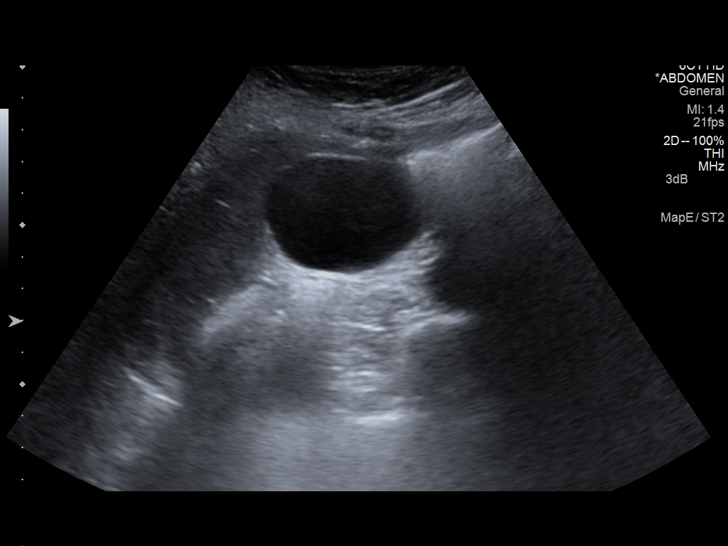
[im 55/83]
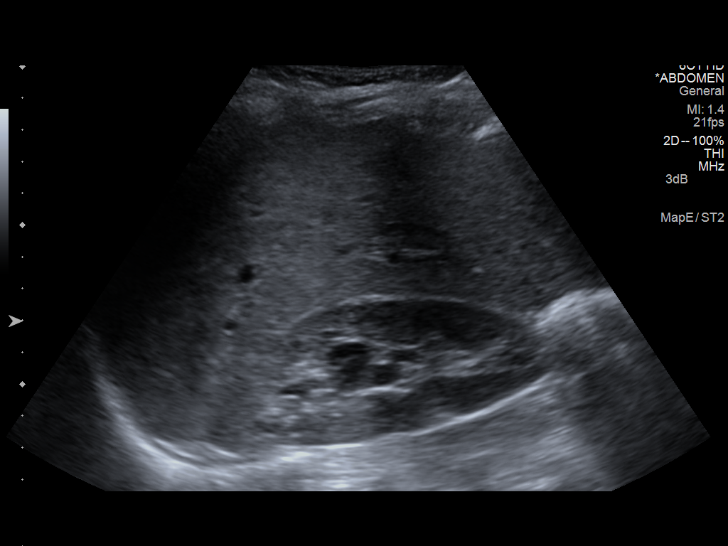
[im 62/83]
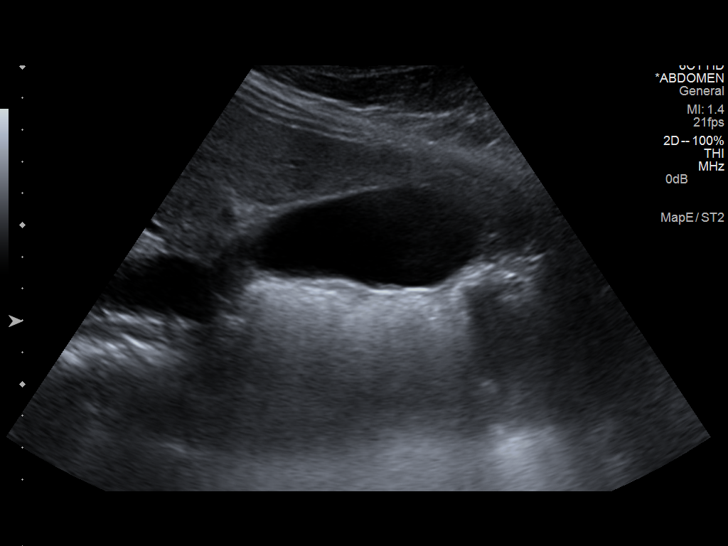
[im 69/83]
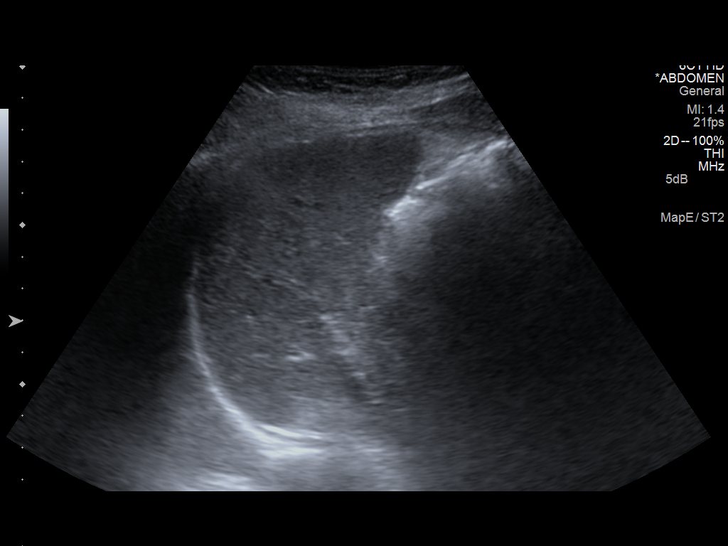
[im 76/83]
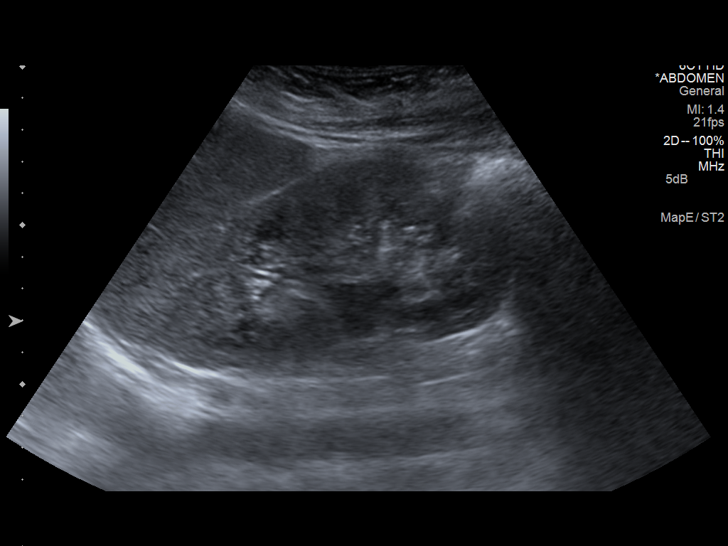
[im 83/83]
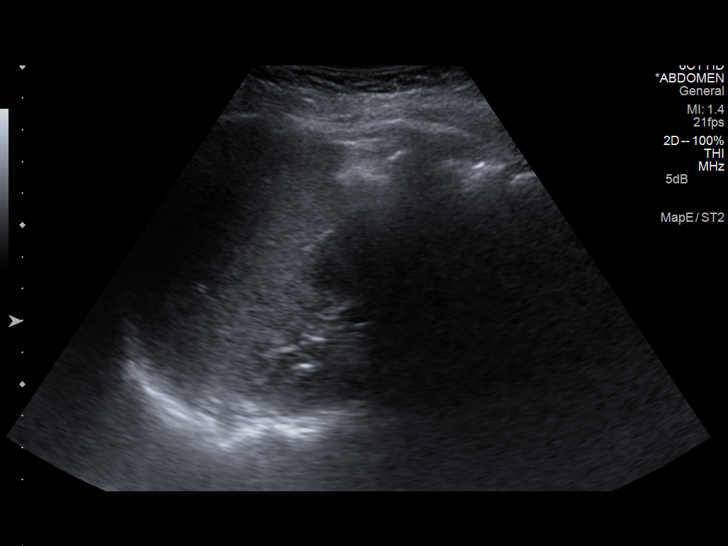

[13 of 25 positions shown; findings below may reference images not displayed]

FINDINGS: Gallbladder

No gallstones or wall thickening visualized. No sonographic Murphy
sign noted. Maximal wall thickness is 2.4 mm, within normal limits.

Common bile duct

Diameter: 2.7 mm, within normal limits.

Liver

No focal lesion identified. Within normal limits in parenchymal
echogenicity.

IVC

No abnormality visualized.

Pancreas

The pancreatic head and proximal body are within normal limits.
There is slight ductal dilation. No obstructing lesion is evident.

Spleen

Size and appearance within normal limits.

Right Kidney

Length: And 12.0 cm, within normal limits. Echogenicity within
normal limits. A focal hypoechoic lesion within the right renal
collecting system measures 10 mm maximally. This likely represents a
focal pyramid were cyst. Recommend 6 month follow-up ultrasound.

Left Kidney

Length: 11.7 cm, within normal limits. Echogenicity within normal
limits. No mass or hydronephrosis visualized.

Abdominal aorta

No aneurysm visualized.
IMPRESSION: 1. 10 mm hypoechoic lesion within the right renal collecting system
likely represents a focally dilated pyramid versus cyst. Recommend 6
month followup renal ultrasound to assure stability.
2. Minimal dilation of the pancreatic head is nonspecific. No
obstructing lesion is evident.
3. Otherwise normal abdominal ultrasound.

## 2013-04-07 MED ORDER — FERROUS SULFATE 325 (65 FE) MG PO TABS
325.0000 mg | ORAL_TABLET | Freq: Three times a day (TID) | ORAL | Status: DC
  Administered 2013-04-07 (×2): 325 mg via ORAL
  Filled 2013-04-07 (×6): qty 1

## 2013-04-07 MED ORDER — ALUM & MAG HYDROXIDE-SIMETH 200-200-20 MG/5ML PO SUSP
15.0000 mL | Freq: Four times a day (QID) | ORAL | Status: DC | PRN
  Administered 2013-04-07: 15 mL via ORAL
  Filled 2013-04-07: qty 30

## 2013-04-07 NOTE — Progress Notes (Signed)
*  PRELIMINARY RESULTS* Vascular Ultrasound Lower extremity venous duplex has been completed.  Preliminary findings: no evidence of DVT    Farrel Demark, RDMS, RVT  04/07/2013, 10:54 AM

## 2013-04-07 NOTE — Progress Notes (Signed)
[facility] PROGRESS NOTE  Sherry Blair ZOX:[id number] DOB: 1970/10/19 DOA: 04/06/2013 PCP: Provider Not In System  HPI: 42 year old female with a history of gastric bypass and chronic daily headache presents with 2 month history of dizziness and dyspnea on exertion. She states that her endurance has decreased gradually over the past 2 months to the point that she is having difficulty climbing up her 15 stairs at home. She also has some dizziness when getting up from a reclining position with episodes of near syncope. She went to see her primary care physician today where blood work revealed a hemoglobin of 5.1. As a result she was told to come to the ED for further evaluation. CBC in the emergency department confirmed a hemoglobin of 6.1. The patient states that she was diagnosed with iron deficiency anemia a couple years ago, but she quit taking ferrous sulfate one year ago due to GI intolerance and constipation. The patient denies any hematochezia, melena, dysuria, hematuria, epistaxis, hemoptysis, heavy menstrual bleeding. The patient has also been complaining of some "heartburn" that has been occurring for the past 2 weeks. She states that she has been drinking "aloe drinks" which seems to help her symptoms. The patient has also been taking 2-3 Aleve tablets per week for the past 2 months for chronic headache. He denies any alcohol or illegal drug use or tobacco. In the ED, the patient was found to have hemoglobin 6.1, platelets 406,000. She was hemodynamically stable. BMP was unremarkable. Anemia panel was ordered, but results are pending at this time. FOBT was neg.  Assessment/Plan: Symptomatic anemia  - FOBT neg; much improved this morning after transfusion. Not clear if she received 1 or 2 U. Hb 6.8 this morning, needs additional units. Anemia workup with significant iron deficiency. Started supplementation. Will obtain LDH and haptoglobin.  - Likely due to iron deficiency, but cannot  rule out other etiologies  - Patient was noted to have H-J bodies on peripheral smear. Abdominal US with normal spleen.  - Check SPEP, pending - Start ferrous sulfate  Dyspnea on exertion  -Likely related to the patient's Anemia, Echocardiogram with normal systolic function, Chest x-ray normal.  -TSH borderline elevated, recommend short outpatient follow up.  Chest discomfort - resolved, troponin negative x 3  Lower extremity edema and pain  -Venous duplex r/o DVT negative Thrombocytosis - Likely due to iron deficiency anemia  Diet: regular Fluids: none DVT Prophylaxis: SCDs  Code Status: Full Family Communication: patient at bedside  Disposition Plan: inpatient.  Consultants:  none  Procedures:  2D echo Study Conclusions Left ventricle: The cavity size was normal. Systolic function was normal. The estimated ejection fraction was in the range of 60% to 65%. Wall motion was normal; there were no regional wall motion abnormalities.    Antibiotics - none  HPI/Subjective: - feeling much better this morning  Objective: Filed Vitals:   04/07/13 0534 04/07/13 1252 04/07/13 1327 04/07/13 1408  BP: 104/55 112/54 101/76 97/53  Pulse: 63 75 84 85  Temp: 98.9 F (37.2 C) 98.3 F (36.8 C) 97.7 F (36.5 C) 99.2 F (37.3 C)  TempSrc: Oral Oral Oral Oral  Resp: 16 16 16 16   Height:      Weight:      SpO2: 100% 100% 100% 97%    Intake/Output Summary (Last 24 hours) at 04/07/13 1423 Last data filed at 04/07/13 0900  Gross per 24 hour  Intake   1270 ml  Output      0 ml  Net  1270 ml   Filed Weights   04/06/13 2106  Weight: 85.049 kg (187 lb 8 oz)    Exam:  General:  NAD  Cardiovascular: regular rate and rhythm, without MRG  Respiratory: good air movement, clear to auscultation throughout, no wheezing, ronchi or rales  Abdomen: soft, not tender to palpation, positive bowel sounds  MSK: no peripheral edema  Neuro: CN 2-12 grossly intact, MS 5/5 in all  4  Data Reviewed: Basic Metabolic Panel:  Recent Labs Lab 04/06/13 1745 04/07/13 0600  NA 137 136  K 3.9 4.0  CL 105 105  CO2 22 24  GLUCOSE 145* 89  BUN 12 13  CREATININE 0.61 0.58  CALCIUM 8.7 8.0*   Liver Function Tests:  Recent Labs Lab 04/07/13 0600  AST 23  ALT 17  ALKPHOS 48  BILITOT 1.5*  PROT 6.2  ALBUMIN 3.0*   CBC:  Recent Labs Lab 04/06/13 1745 04/07/13 0600  WBC 6.4 5.2  NEUTROABS 3.6  --   HGB 6.1* 6.8*  HCT 23.5* 23.2*  MCV 50.6* 55.4*  PLT 406* 313   Cardiac Enzymes:  Recent Labs Lab 04/06/13 2120 04/07/13 0600 04/07/13 0820  TROPONINI <0.30 <0.30 <0.30   Studies: Dg Chest 2 View  04/07/2013   CLINICAL DATA:  Dyspnea on exertion  EXAM: CHEST  2 VIEW  COMPARISON:  None.  FINDINGS: The heart size and mediastinal contours are within normal limits. Both lungs are clear. The visualized skeletal structures are unremarkable.  IMPRESSION: Negative chest.   Electronically Signed   By: Tiburcio Pea M.D.   On: 04/07/2013 03:35   US Abdomen Complete  04/07/2013   CLINICAL DATA:  Anemia.  EXAM: ULTRASOUND ABDOMEN COMPLETE  COMPARISON:  None.  FINDINGS: Gallbladder  No gallstones or wall thickening visualized. No sonographic Murphy sign noted. Maximal wall thickness is 2.4 mm, within normal limits.  Common bile duct  Diameter: 2.7 mm, within normal limits.  Liver  No focal lesion identified. Within normal limits in parenchymal echogenicity.  IVC  No abnormality visualized.  Pancreas  The pancreatic head and proximal body are within normal limits. There is slight ductal dilation. No obstructing lesion is evident.  Spleen  Size and appearance within normal limits.  Right Kidney  Length: And 12.0 cm, within normal limits. Echogenicity within normal limits. A focal hypoechoic lesion within the right renal collecting system measures 10 mm maximally. This likely represents a focal pyramid were cyst. Recommend 6 month follow-up ultrasound.  Left Kidney   Length: 11.7 cm, within normal limits. Echogenicity within normal limits. No mass or hydronephrosis visualized.  Abdominal aorta  No aneurysm visualized.  IMPRESSION: 1. 10 mm hypoechoic lesion within the right renal collecting system likely represents a focally dilated pyramid versus cyst. Recommend 6 month followup renal ultrasound to assure stability. 2. Minimal dilation of the pancreatic head is nonspecific. No obstructing lesion is evident. 3. Otherwise normal abdominal ultrasound.   Electronically Signed   By: Gennette Pac M.D.   On: 04/07/2013 08:40    Scheduled Meds: . ferrous sulfate  325 mg Oral TID WC  . multivitamin with minerals  1 tablet Oral Daily  . vitamin C  500 mg Oral BID   Continuous Infusions:   Active Problems:   Dyspnea on exertion   Chest pain   Iron deficiency anemia   Thrombocytosis  Time spent: 70  Pamella Pert, MD [facility] Pager [phone]. If 7 PM - 7 AM, please contact night-coverage at [email or web address], password [facility]  04/07/2013, 2:23 PM  LOS: 1 day

## 2013-04-07 NOTE — Progress Notes (Signed)
Echocardiogram 2D Echocardiogram has been performed.  Sherry Blair 04/07/2013, 10:28 AM

## 2013-04-08 LAB — HAPTOGLOBIN: Haptoglobin: 174 mg/dL (ref 45–215)

## 2013-04-08 LAB — TYPE AND SCREEN
ABO/RH(D): B POS
Unit division: 0
Unit division: 0

## 2013-04-08 LAB — CBC
HCT: 31.2 % — ABNORMAL LOW (ref 36.0–46.0)
Hemoglobin: 9.5 g/dL — ABNORMAL LOW (ref 12.0–15.0)
MCH: 18.5 pg — ABNORMAL LOW (ref 26.0–34.0)
Platelets: 336 10*3/uL (ref 150–400)
WBC: 6.8 10*3/uL (ref 4.0–10.5)

## 2013-04-08 MED ORDER — FERROUS SULFATE 325 (65 FE) MG PO TABS: 325.0000 mg | ORAL_TABLET | Freq: Three times a day (TID) | ORAL | Status: DC

## 2013-04-08 NOTE — Discharge Summary (Signed)
Physician Discharge Summary  Sherry Blair ZOX:[id number] DOB: 06/28/1970 DOA: 04/06/2013  PCP: Provider Not In System  Admit date: 04/06/2013 Discharge date: 04/08/2013  Time spent: 35 minutes  Recommendations for Outpatient Follow-up:  1. Follow up with PCP ([facility]) in 1 week   Recommendations for primary care physician for things to follow:  Repeat CBC at the next appointment Repeat TSH in 2-3 weeks  Discharge Diagnoses:  Active Problems:   Dyspnea on exertion   Chest pain   Iron deficiency anemia   Thrombocytosis  Discharge Condition: stable  Diet recommendation: regular  Filed Weights   04/06/13 2106  Weight: 85.049 kg (187 lb 8 oz)    History of present illness:  42 year old female with a history of gastric bypass and chronic daily headache presents with 2 month history of dizziness and dyspnea on exertion. She states that her endurance has decreased gradually over the past 2 months to the point that she is having difficulty climbing up her 15 stairs at home. She also has some dizziness when getting up from a reclining position with episodes of near syncope. She went to see her primary care physician today where blood work revealed a hemoglobin of 5.1. As a result she was told to come to the ED for further evaluation. CBC in the emergency department confirmed a hemoglobin of 6.1. The patient states that she was diagnosed with iron deficiency anemia a couple years ago, but she quit taking ferrous sulfate one year ago due to GI intolerance and constipation. The patient denies any hematochezia, melena, dysuria, hematuria, epistaxis, hemoptysis, heavy menstrual bleeding. The patient has also been complaining of some "heartburn" that has been occurring for the past 2 weeks. She states that she has been drinking "aloe drinks" which seems to help her symptoms. The patient has also been taking 2-3 Aleve tablets per week for the past 2 months for chronic headache. He denies any  alcohol or illegal drug use or tobacco. In the ED, the patient was found to have hemoglobin 6.1, platelets 406,000. She was hemodynamically stable. BMP was unremarkable. FOBT was neg.  Hospital Course:  Symptomatic anemia - FOBT negative and without evidence of bleeding from other sites. Much improved after transfusion and patient's symptoms have completely resolved with transfusion. She has received apparently 1 or 2 units in the ED and 2 units on the floor. Hb at discharge was 9.5 and stable from the night prior. Anemia workup with significant iron deficiency and patient was started on iron supplements. She has normal LDH and haptoglobin, inappropriately normal reticulocytes. I advised patient to follow up with her PCP later this week for repeat CBC. Patient was also noted to have H-J bodies on peripheral smear. Abdominal US with normal spleen. Advised to follow up with her PCP this week for SPEP results obtained here, now are pending.  Dyspnea on exertion -Likely related to the patient's Anemia, Echocardiogram with normal systolic function, Chest x-ray normal. TSH borderline elevated, recommend short outpatient follow up. Her dyspnea resolved with blood transfusion.  Chest discomfort - resolved, troponin negative x 3  Lower extremity edema and pain -Venous duplex r/o DVT negative  Procedures:  none   Consultations:  none  Discharge Exam: Filed Vitals:   04/07/13 2006 04/07/13 2057 04/07/13 2140 04/08/13 0449  BP:  105/60 109/60 106/61  Pulse:  62 66 55  Temp: 98.3 F (36.8 C) 98.2 F (36.8 C) 98.6 F (37 C) 98.3 F (36.8 C)  TempSrc: Oral Oral Oral Oral  Resp:  16 10 14   Height:      Weight:      SpO2:    100%   General: NAD Cardiovascular: RRR Respiratory: CTA biL  Discharge Instructions     Medication List         ALEVE PO  Take 2 tablets by mouth daily as needed (headache).     CENTRUM ULTRA WOMENS PO  Take 2 tablets by mouth daily.     ferrous sulfate 325 (65  FE) MG tablet  Take 1 tablet (325 mg total) by mouth 3 (three) times daily with meals.           Follow-up Information   Follow up with [facility] PCP. Schedule an appointment as soon as possible for a visit in 4 days.      The results of significant diagnostics from this hospitalization (including imaging, microbiology, ancillary and laboratory) are listed below for reference.    Significant Diagnostic Studies: Dg Chest 2 View  04/07/2013   CLINICAL DATA:  Dyspnea on exertion  EXAM: CHEST  2 VIEW  COMPARISON:  None.  FINDINGS: The heart size and mediastinal contours are within normal limits. Both lungs are clear. The visualized skeletal structures are unremarkable.  IMPRESSION: Negative chest.   Electronically Signed   By: Tiburcio Pea M.D.   On: 04/07/2013 03:35   US Abdomen Complete  04/07/2013   CLINICAL DATA:  Anemia.  EXAM: ULTRASOUND ABDOMEN COMPLETE  COMPARISON:  None.  FINDINGS: Gallbladder  No gallstones or wall thickening visualized. No sonographic Murphy sign noted. Maximal wall thickness is 2.4 mm, within normal limits.  Common bile duct  Diameter: 2.7 mm, within normal limits.  Liver  No focal lesion identified. Within normal limits in parenchymal echogenicity.  IVC  No abnormality visualized.  Pancreas  The pancreatic head and proximal body are within normal limits. There is slight ductal dilation. No obstructing lesion is evident.  Spleen  Size and appearance within normal limits.  Right Kidney  Length: And 12.0 cm, within normal limits. Echogenicity within normal limits. A focal hypoechoic lesion within the right renal collecting system measures 10 mm maximally. This likely represents a focal pyramid were cyst. Recommend 6 month follow-up ultrasound.  Left Kidney  Length: 11.7 cm, within normal limits. Echogenicity within normal limits. No mass or hydronephrosis visualized.  Abdominal aorta  No aneurysm visualized.  IMPRESSION: 1. 10 mm hypoechoic lesion within the right renal  collecting system likely represents a focally dilated pyramid versus cyst. Recommend 6 month followup renal ultrasound to assure stability. 2. Minimal dilation of the pancreatic head is nonspecific. No obstructing lesion is evident. 3. Otherwise normal abdominal ultrasound.   Electronically Signed   By: Gennette Pac M.D.   On: 04/07/2013 08:40   Labs: Basic Metabolic Panel:  Recent Labs Lab 04/06/13 1745 04/07/13 0600  NA 137 136  K 3.9 4.0  CL 105 105  CO2 22 24  GLUCOSE 145* 89  BUN 12 13  CREATININE 0.61 0.58  CALCIUM 8.7 8.0*   Liver Function Tests:  Recent Labs Lab 04/07/13 0600  AST 23  ALT 17  ALKPHOS 48  BILITOT 1.5*  PROT 6.2  ALBUMIN 3.0*   CBC:  Recent Labs Lab 04/06/13 1745 04/07/13 0600 04/07/13 1825 04/08/13 0410  WBC 6.4 5.2 6.4 6.8  NEUTROABS 3.6  --   --   --   HGB 6.1* 6.8* 8.8* 9.5*  HCT 23.5* 23.2* 29.6* 31.2*  MCV 50.6* 55.4* 58.8* 60.8*  PLT 406* 313 345 336   Cardiac Enzymes:  Recent Labs Lab 04/06/13 2120 04/07/13 0600 04/07/13 0820  TROPONINI <0.30 <0.30 <0.30   Signed:  Pamella Pert  [facility] 04/08/2013, 2:06 PM

## 2013-04-10 LAB — PROTEIN ELECTROPHORESIS, SERUM
Albumin ELP: 51.7 % — ABNORMAL LOW (ref 55.8–66.1)
Alpha-1-Globulin: 4.6 % (ref 2.9–4.9)
Beta 2: 5.3 % (ref 3.2–6.5)
Gamma Globulin: 17.8 % (ref 11.1–18.8)
M-Spike, %: NOT DETECTED g/dL

## 2014-02-07 ENCOUNTER — Other Ambulatory Visit: Payer: Self-pay | Admitting: Obstetrics

## 2014-02-07 ENCOUNTER — Encounter: Payer: Self-pay | Admitting: Obstetrics

## 2014-02-07 ENCOUNTER — Ambulatory Visit (INDEPENDENT_AMBULATORY_CARE_PROVIDER_SITE_OTHER): Payer: 59 | Admitting: Obstetrics

## 2014-02-07 VITALS — BP 116/84 | Temp 98.3°F | Ht 63.0 in | Wt 192.0 lb

## 2014-02-07 DIAGNOSIS — D509 Iron deficiency anemia, unspecified: Secondary | ICD-10-CM

## 2014-02-07 DIAGNOSIS — Z1239 Encounter for other screening for malignant neoplasm of breast: Secondary | ICD-10-CM

## 2014-02-07 DIAGNOSIS — N926 Irregular menstruation, unspecified: Secondary | ICD-10-CM

## 2014-02-07 DIAGNOSIS — N939 Abnormal uterine and vaginal bleeding, unspecified: Secondary | ICD-10-CM

## 2014-02-07 DIAGNOSIS — B9689 Other specified bacterial agents as the cause of diseases classified elsewhere: Secondary | ICD-10-CM

## 2014-02-07 DIAGNOSIS — Z01419 Encounter for gynecological examination (general) (routine) without abnormal findings: Secondary | ICD-10-CM

## 2014-02-07 DIAGNOSIS — N760 Acute vaginitis: Secondary | ICD-10-CM

## 2014-02-07 DIAGNOSIS — A499 Bacterial infection, unspecified: Secondary | ICD-10-CM

## 2014-02-07 LAB — CBC WITH DIFFERENTIAL/PLATELET
BASOS ABS: 0.1 10*3/uL (ref 0.0–0.1)
BASOS PCT: 1 % (ref 0–1)
Eosinophils Absolute: 0.1 10*3/uL (ref 0.0–0.7)
Eosinophils Relative: 2 % (ref 0–5)
HEMATOCRIT: 35.5 % — AB (ref 36.0–46.0)
Hemoglobin: 11.2 g/dL — ABNORMAL LOW (ref 12.0–15.0)
Lymphocytes Relative: 43 % (ref 12–46)
Lymphs Abs: 2.5 10*3/uL (ref 0.7–4.0)
MCH: 22.4 pg — ABNORMAL LOW (ref 26.0–34.0)
MCHC: 31.5 g/dL (ref 30.0–36.0)
MCV: 71.1 fL — ABNORMAL LOW (ref 78.0–100.0)
MONO ABS: 0.5 10*3/uL (ref 0.1–1.0)
Monocytes Relative: 8 % (ref 3–12)
Neutro Abs: 2.7 10*3/uL (ref 1.7–7.7)
Neutrophils Relative %: 46 % (ref 43–77)
Platelets: 372 10*3/uL (ref 150–400)
RBC: 4.99 MIL/uL (ref 3.87–5.11)
RDW: 15.5 % (ref 11.5–15.5)
WBC: 5.9 10*3/uL (ref 4.0–10.5)

## 2014-02-07 NOTE — Progress Notes (Signed)
Subjective:     Sherry Blair is a 43 y.o. female here for a routine exam.  Current complaints: none.    Personal health questionnaire:  Is patient [name], have a family history of breast and/or ovarian cancer: no Is there a family history of uterine cancer diagnosed at age < 36, gastrointestinal cancer, urinary tract cancer, family member who is a Field seismologist syndrome-associated carrier: no Is the patient overweight and hypertensive, family history of diabetes, personal history of gestational diabetes or PCOS: no Is patient over 59, have PCOS,  family history of premature CHD under age 55, diabetes, smoke, have hypertension or peripheral artery disease:  no At any time, has a partner hit, kicked or otherwise hurt or frightened you?: no Over the past 2 weeks, have you felt down, depressed or hopeless?: no Over the past 2 weeks, have you felt little interest or pleasure in doing things?:no   Gynecologic History Patient's last menstrual period was 01/24/2014. Contraception: none Last Pap: 2 yrs. ago. Results were: normal Last mammogram: never. Results were: n/a  Obstetric History OB History  Gravida Para Term Preterm AB SAB TAB Ectopic Multiple Living  4 2 2  2  1 1  2     # Outcome Date GA Lbr Len/2nd Weight Sex Delivery Anes PTL Lv  4 ECT 2011 [redacted]w[redacted]d       N  3 TRM 09/19/97 [redacted]w[redacted]d  7 lb 8 oz (3.402 kg) M SVD EPI  Y  2 TRM 08/24/96 [redacted]w[redacted]d  7 lb 6 oz (3.345 kg) F SVD EPI  Y  1 TAB         N      History reviewed. No pertinent past medical history.  Past Surgical History  Procedure Laterality Date  . Gastric bypass      Current outpatient prescriptions:ferrous sulfate 325 (65 FE) MG tablet, Take 1 tablet (325 mg total) by mouth 3 (three) times daily with meals., Disp: 90 tablet, Rfl: 3;  Multiple Vitamins-Minerals (CENTRUM ULTRA WOMENS PO), Take 2 tablets by mouth daily., Disp: , Rfl:  No Known Allergies  History  Substance Use Topics  . Smoking status: Never Smoker   .  Smokeless tobacco: Never Used  . Alcohol Use: Yes     Comment: Occasionally     Family History  Problem Relation Age of Onset  . Hyperlipidemia Mother   . Lupus Mother   . Hypertension Father   . Diabetes Father       Review of Systems  Constitutional: negative for fatigue and weight loss Respiratory: negative for cough and wheezing Cardiovascular: negative for chest pain, fatigue and palpitations Gastrointestinal: negative for abdominal pain and change in bowel habits Musculoskeletal:negative for myalgias Neurological: negative for gait problems and tremors Behavioral/Psych: negative for abusive relationship, depression Endocrine: negative for temperature intolerance   Genitourinary:negative for abnormal menstrual periods, genital lesions, hot flashes, sexual problems and vaginal discharge Integument/breast: negative for breast lump, breast tenderness, nipple discharge and skin lesion(s)    Objective:       BP 116/84  Temp(Src) 98.3 F (36.8 C)  Ht 5\' 3"  (1.6 m)  Wt 192 lb (87.091 kg)  BMI 34.02 kg/m2  LMP 01/24/2014 General:   alert  Skin:   no rash or abnormalities  Lungs:   clear to auscultation bilaterally  Heart:   regular rate and rhythm, S1, S2 normal, no murmur, click, rub or gallop  Breasts:   normal without suspicious masses, skin or nipple changes or axillary nodes  Abdomen:  normal findings: no organomegaly, soft, non-tender and no hernia  Pelvis:  External genitalia: normal general appearance Urinary system: urethral meatus normal and bladder without fullness, nontender Vaginal: normal without tenderness, induration or masses Cervix: normal appearance Adnexa: normal bimanual exam Uterus: anteverted and non-tender, normal size   Lab Review Urine pregnancy test Labs reviewed yes Radiologic studies reviewed yes    Assessment:    Healthy female exam.   AUB  H/O anemia and transfusion for anemia  H/O Obesity and is S/P Gastric Bypass Surgery    Plan:    Education reviewed: low fat, low cholesterol diet, self breast exams, weight bearing exercise and management of AUB. Mammogram ordered. Follow up in: 2 weeks. Ultrasound ordered   No orders of the defined types were placed in this encounter.   Orders Placed This Encounter  Procedures  . WET PREP BY MOLECULAR PROBE  . GC/Chlamydia Probe Amp  . MM Digital Screening    Standing Status: Future     Number of Occurrences:      Standing Expiration Date: 04/10/2015    Order Specific Question:  Reason for Exam (SYMPTOM  OR DIAGNOSIS REQUIRED)    Answer:  screening    Order Specific Question:  Is the patient pregnant?    Answer:  No    Order Specific Question:  Preferred imaging location?    Answer:  [facility]  . US Pelvis Complete    Standing Status: Future     Number of Occurrences:      Standing Expiration Date: 04/10/2015    Order Specific Question:  Reason for Exam (SYMPTOM  OR DIAGNOSIS REQUIRED)    Answer:  AUB.  Anemia.    Order Specific Question:  Preferred imaging location?    Answer:  [facility]  . US Transvaginal Non-OB    Standing Status: Future     Number of Occurrences:      Standing Expiration Date: 04/10/2015    Order Specific Question:  Reason for Exam (SYMPTOM  OR DIAGNOSIS REQUIRED)    Answer:  AUB.  Anemia    Order Specific Question:  Preferred imaging location?    Answer:  [facility]  . CBC with Differential  . Comprehensive metabolic panel  . TSH   Need to obtain previous records Possible management options include: Endometrial Ablation.  Mirena IUD.  Hysterectomy.

## 2014-02-08 ENCOUNTER — Telehealth: Payer: Self-pay

## 2014-02-08 LAB — COMPREHENSIVE METABOLIC PANEL
ALT: 20 U/L (ref 0–35)
AST: 24 U/L (ref 0–37)
Albumin: 4 g/dL (ref 3.5–5.2)
Alkaline Phosphatase: 49 U/L (ref 39–117)
BUN: 11 mg/dL (ref 6–23)
CO2: 23 mEq/L (ref 19–32)
CREATININE: 0.67 mg/dL (ref 0.50–1.10)
Calcium: 8.5 mg/dL (ref 8.4–10.5)
Chloride: 106 mEq/L (ref 96–112)
Glucose, Bld: 91 mg/dL (ref 70–99)
POTASSIUM: 4.2 meq/L (ref 3.5–5.3)
Sodium: 138 mEq/L (ref 135–145)
Total Bilirubin: 0.5 mg/dL (ref 0.2–1.2)
Total Protein: 6.8 g/dL (ref 6.0–8.3)

## 2014-02-08 LAB — WET PREP BY MOLECULAR PROBE
CANDIDA SPECIES: NEGATIVE
Gardnerella vaginalis: POSITIVE — AB
Trichomonas vaginosis: NEGATIVE

## 2014-02-08 LAB — TSH: TSH: 1.598 u[IU]/mL (ref 0.350–4.500)

## 2014-02-08 LAB — GC/CHLAMYDIA PROBE AMP
CT Probe RNA: NEGATIVE
GC Probe RNA: NEGATIVE

## 2014-02-08 MED ORDER — METRONIDAZOLE 500 MG PO TABS
500.0000 mg | ORAL_TABLET | Freq: Two times a day (BID) | ORAL | Status: DC
Start: 2014-02-08 — End: 2014-06-04

## 2014-02-08 NOTE — Addendum Note (Signed)
Addended by: Baltazar Najjar A on: 02/08/2014 07:01 AM   Modules accepted: Orders

## 2014-02-08 NOTE — Telephone Encounter (Signed)
patient knows time and date for mammogram and u/s at [facility]

## 2014-02-09 LAB — PAP IG AND HPV HIGH-RISK: HPV DNA High Risk: NOT DETECTED

## 2014-02-15 ENCOUNTER — Ambulatory Visit (HOSPITAL_COMMUNITY): Payer: 59

## 2014-02-21 ENCOUNTER — Ambulatory Visit: Payer: 59 | Admitting: Obstetrics

## 2014-04-23 ENCOUNTER — Encounter: Payer: Self-pay | Admitting: Obstetrics

## 2014-06-04 ENCOUNTER — Encounter: Payer: Self-pay | Admitting: Obstetrics

## 2014-06-04 ENCOUNTER — Ambulatory Visit (INDEPENDENT_AMBULATORY_CARE_PROVIDER_SITE_OTHER): Payer: 59 | Admitting: Obstetrics

## 2014-06-04 VITALS — BP 119/80 | HR 66 | Temp 98.9°F | Ht 63.0 in | Wt 187.0 lb

## 2014-06-04 DIAGNOSIS — N390 Urinary tract infection, site not specified: Secondary | ICD-10-CM

## 2014-06-04 DIAGNOSIS — A499 Bacterial infection, unspecified: Secondary | ICD-10-CM

## 2014-06-04 DIAGNOSIS — N760 Acute vaginitis: Secondary | ICD-10-CM

## 2014-06-04 DIAGNOSIS — B9689 Other specified bacterial agents as the cause of diseases classified elsewhere: Secondary | ICD-10-CM

## 2014-06-04 LAB — POCT URINALYSIS DIPSTICK
Bilirubin, UA: NEGATIVE
Glucose, UA: NEGATIVE
Ketones, UA: NEGATIVE
Nitrite, UA: POSITIVE
PROTEIN UA: NEGATIVE
RBC UA: NEGATIVE
Spec Grav, UA: 1.015
Urobilinogen, UA: NEGATIVE
pH, UA: 5

## 2014-06-04 MED ORDER — METRONIDAZOLE 500 MG PO TABS: 500.0000 mg | ORAL_TABLET | Freq: Two times a day (BID) | ORAL | Status: DC

## 2014-06-04 MED ORDER — NITROFURANTOIN MONOHYD MACRO 100 MG PO CAPS: 100.0000 mg | ORAL_CAPSULE | Freq: Two times a day (BID) | ORAL | Status: DC

## 2014-06-05 ENCOUNTER — Encounter: Payer: Self-pay | Admitting: Obstetrics

## 2014-06-05 NOTE — Progress Notes (Signed)
Patient ID: Sherry Blair, female   DOB: 10-01-1970, 43 y.o.   MRN: [id number]  Chief Complaint  Patient presents with  . Problem    Vaginal irritation and discharge     HPI Sherry Blair is a 43 y.o. female.  Malodorous vaginal discharge.  Burning with urination.  HPI  History reviewed. No pertinent past medical history.  Past Surgical History  Procedure Laterality Date  . Gastric bypass      Family History  Problem Relation Age of Onset  . Hyperlipidemia Mother   . Lupus Mother   . Hypertension Father   . Diabetes Father     Social History History  Substance Use Topics  . Smoking status: Never Smoker   . Smokeless tobacco: Never Used  . Alcohol Use: Yes     Comment: Occasionally     No Known Allergies  Current Outpatient Prescriptions  Medication Sig Dispense Refill  . ferrous sulfate 325 (65 FE) MG tablet Take 1 tablet (325 mg total) by mouth 3 (three) times daily with meals. 90 tablet 3  . Multiple Vitamins-Minerals (CENTRUM ULTRA WOMENS PO) Take 2 tablets by mouth daily.    . metroNIDAZOLE (FLAGYL) 500 MG tablet Take 1 tablet (500 mg total) by mouth 2 (two) times daily. 14 tablet 2  . nitrofurantoin, macrocrystal-monohydrate, (MACROBID) 100 MG capsule Take 1 capsule (100 mg total) by mouth 2 (two) times daily. 1 po BID x 7days 14 capsule 2   No current facility-administered medications for this visit.    Review of Systems Review of Systems Constitutional: negative for fatigue and weight loss Respiratory: negative for cough and wheezing Cardiovascular: negative for chest pain, fatigue and palpitations Gastrointestinal: negative for abdominal pain and change in bowel habits Genitourinary:negative Integument/breast: negative for nipple discharge Musculoskeletal:negative for myalgias Neurological: negative for gait problems and tremors Behavioral/Psych: negative for abusive relationship, depression Endocrine: negative for temperature intolerance      Blood pressure 119/80, pulse 66, temperature 98.9 F (37.2 C), height 5\' 3"  (1.6 m), weight 187 lb (84.823 kg), last menstrual period 05/13/2014.  Physical Exam Physical Exam General:   alert  Skin:   no rash or abnormalities  Lungs:   clear to auscultation bilaterally  Heart:   regular rate and rhythm, S1, S2 normal, no murmur, click, rub or gallop  Breasts:   normal without suspicious masses, skin or nipple changes or axillary nodes  Abdomen:  normal findings: no organomegaly, soft, non-tender and no hernia  Pelvis:  External genitalia: normal general appearance Urinary system: urethral meatus normal and bladder without fullness, nontender Vaginal: normal without tenderness, induration or masses Cervix: normal appearance Adnexa: normal bimanual exam Uterus: anteverted and non-tender, normal size      Data Reviewed Labs  Assessment    BV UTI     Plan    Flagyl Rx Macrobid Rx  Orders Placed This Encounter  Procedures  . SureSwab, Vaginosis/Vaginitis Plus  . Urine culture  . POCT urinalysis dipstick   Meds ordered this encounter  Medications  . metroNIDAZOLE (FLAGYL) 500 MG tablet    Sig: Take 1 tablet (500 mg total) by mouth 2 (two) times daily.    Dispense:  14 tablet    Refill:  2  . nitrofurantoin, macrocrystal-monohydrate, (MACROBID) 100 MG capsule    Sig: Take 1 capsule (100 mg total) by mouth 2 (two) times daily. 1 po BID x 7days    Dispense:  14 capsule    Refill:  2  HARPER,CHARLES A 06/05/2014, 7:08 AM

## 2014-06-07 ENCOUNTER — Other Ambulatory Visit: Payer: Self-pay | Admitting: Obstetrics

## 2014-06-07 DIAGNOSIS — N390 Urinary tract infection, site not specified: Secondary | ICD-10-CM

## 2014-06-07 LAB — URINE CULTURE

## 2014-06-07 MED ORDER — CEPHALEXIN 500 MG PO CAPS: 500.0000 mg | ORAL_CAPSULE | Freq: Four times a day (QID) | ORAL | Status: DC

## 2014-06-09 LAB — SURESWAB, VAGINOSIS/VAGINITIS PLUS
Atopobium vaginae: 7.5 Log (cells/mL)
C. albicans, DNA: NOT DETECTED
C. glabrata, DNA: NOT DETECTED
C. parapsilosis, DNA: NOT DETECTED
C. trachomatis RNA, TMA: NOT DETECTED
C. tropicalis, DNA: NOT DETECTED
LACTOBACILLUS SPECIES: NOT DETECTED Log (cells/mL)
N. gonorrhoeae RNA, TMA: NOT DETECTED
T. vaginalis RNA, QL TMA: NOT DETECTED

## 2014-06-11 ENCOUNTER — Other Ambulatory Visit: Payer: Self-pay | Admitting: Obstetrics

## 2014-06-28 ENCOUNTER — Telehealth: Payer: Self-pay | Admitting: *Deleted

## 2014-06-28 NOTE — Telephone Encounter (Signed)
Patient called the office requesting test results. Attempted to contact the patient and left message for patient to call the office.

## 2014-06-28 NOTE — Telephone Encounter (Signed)
Patient advised of test results and patient states she has picked up prescription and has taken it.

## 2014-10-23 ENCOUNTER — Telehealth: Payer: Self-pay | Admitting: *Deleted

## 2014-10-23 NOTE — Telephone Encounter (Signed)
Patient thinks she is having a reoccurrence of her UTI- she refilled her antibiotic- but it didn't help her symptoms. She is presently not taking anything- offered a lab appointment for patient to leave specimen. Patient  will come in tomorrow and leave specimen

## 2014-10-24 ENCOUNTER — Other Ambulatory Visit: Payer: BLUE CROSS/BLUE SHIELD

## 2014-10-24 VITALS — BP 118/76 | HR 85 | Temp 98.8°F | Wt 190.0 lb

## 2014-10-24 DIAGNOSIS — R399 Unspecified symptoms and signs involving the genitourinary system: Secondary | ICD-10-CM

## 2014-10-24 DIAGNOSIS — N898 Other specified noninflammatory disorders of vagina: Secondary | ICD-10-CM

## 2014-10-24 NOTE — Progress Notes (Signed)
Patient in the office for possible UTI. Patient has recently tried an antibiotic for the UTI and the symptoms have not improved.  Patient states she is also having vaginal irritation. Patient states she is having vaginal irritation. Urine culture and sure swab sent for patient. Advised patient we would call with her results.   BP 118/76 mmHg  Pulse 85  Temp(Src) 98.8 F (37.1 C)  Wt 190 lb (86.183 kg)

## 2014-10-27 LAB — URINE CULTURE: Colony Count: 50000

## 2014-10-28 ENCOUNTER — Other Ambulatory Visit: Payer: Self-pay | Admitting: Obstetrics

## 2014-10-28 DIAGNOSIS — N390 Urinary tract infection, site not specified: Secondary | ICD-10-CM

## 2014-10-28 LAB — SURESWAB, VAGINOSIS/VAGINITIS PLUS
ATOPOBIUM VAGINAE: NOT DETECTED Log (cells/mL)
C. albicans, DNA: NOT DETECTED
C. glabrata, DNA: NOT DETECTED
C. parapsilosis, DNA: NOT DETECTED
C. trachomatis RNA, TMA: NOT DETECTED
C. tropicalis, DNA: NOT DETECTED
Gardnerella vaginalis: <4.7 Log (cells/mL)
LACTOBACILLUS SPECIES: >8 Log (cells/mL)
MEGASPHAERA SPECIES: NOT DETECTED Log (cells/mL)
N. gonorrhoeae RNA, TMA: NOT DETECTED
T. vaginalis RNA, QL TMA: NOT DETECTED

## 2014-10-28 MED ORDER — CEPHALEXIN 500 MG PO CAPS: 500.0000 mg | ORAL_CAPSULE | Freq: Four times a day (QID) | ORAL | Status: DC

## 2014-12-27 ENCOUNTER — Emergency Department (HOSPITAL_COMMUNITY)
Admission: EM | Admit: 2014-12-27 | Discharge: 2014-12-27 | Disposition: A | Discharge status: Home / Self Care | Payer: BLUE CROSS/BLUE SHIELD | Attending: Emergency Medicine | Admitting: Emergency Medicine

## 2014-12-27 ENCOUNTER — Encounter (HOSPITAL_COMMUNITY): Payer: Self-pay

## 2014-12-27 DIAGNOSIS — Z79899 Other long term (current) drug therapy: Secondary | ICD-10-CM | POA: Insufficient documentation

## 2014-12-27 DIAGNOSIS — R0602 Shortness of breath: Secondary | ICD-10-CM | POA: Insufficient documentation

## 2014-12-27 DIAGNOSIS — N920 Excessive and frequent menstruation with regular cycle: Secondary | ICD-10-CM

## 2014-12-27 DIAGNOSIS — Z792 Long term (current) use of antibiotics: Secondary | ICD-10-CM | POA: Diagnosis not present

## 2014-12-27 DIAGNOSIS — Z9884 Bariatric surgery status: Secondary | ICD-10-CM | POA: Insufficient documentation

## 2014-12-27 DIAGNOSIS — N939 Abnormal uterine and vaginal bleeding, unspecified: Secondary | ICD-10-CM | POA: Diagnosis not present

## 2014-12-27 DIAGNOSIS — R0609 Other forms of dyspnea: Secondary | ICD-10-CM

## 2014-12-27 DIAGNOSIS — D509 Iron deficiency anemia, unspecified: Secondary | ICD-10-CM

## 2014-12-27 DIAGNOSIS — R42 Dizziness and giddiness: Secondary | ICD-10-CM

## 2014-12-27 DIAGNOSIS — R0600 Dyspnea, unspecified: Secondary | ICD-10-CM

## 2014-12-27 DIAGNOSIS — R5383 Other fatigue: Secondary | ICD-10-CM | POA: Insufficient documentation

## 2014-12-27 LAB — CBC WITH DIFFERENTIAL/PLATELET
Basophils Absolute: 0.1 10*3/uL (ref 0.0–0.1)
Basophils Relative: 1 % (ref 0–1)
EOS PCT: 2 % (ref 0–5)
Eosinophils Absolute: 0.1 10*3/uL (ref 0.0–0.7)
HEMATOCRIT: 33.5 % — AB (ref 36.0–46.0)
Hemoglobin: 9 g/dL — ABNORMAL LOW (ref 12.0–15.0)
LYMPHS PCT: 40 % (ref 12–46)
Lymphs Abs: 2 10*3/uL (ref 0.7–4.0)
MCH: 14.8 pg — ABNORMAL LOW (ref 26.0–34.0)
MCHC: 26.9 g/dL — ABNORMAL LOW (ref 30.0–36.0)
MCV: 55.2 fL — AB (ref 78.0–100.0)
MONOS PCT: 6 % (ref 3–12)
Monocytes Absolute: 0.3 10*3/uL (ref 0.1–1.0)
NEUTROS PCT: 51 % (ref 43–77)
Neutro Abs: 2.5 10*3/uL (ref 1.7–7.7)
PLATELETS: 299 10*3/uL (ref 150–400)
RBC: 6.07 MIL/uL — ABNORMAL HIGH (ref 3.87–5.11)
RDW: 21.1 % — ABNORMAL HIGH (ref 11.5–15.5)
WBC: 5 10*3/uL (ref 4.0–10.5)

## 2014-12-27 LAB — BASIC METABOLIC PANEL
ANION GAP: 9 (ref 5–15)
BUN: 19 mg/dL (ref 6–20)
CALCIUM: 8.4 mg/dL — AB (ref 8.9–10.3)
CO2: 23 mmol/L (ref 22–32)
CREATININE: 0.54 mg/dL (ref 0.44–1.00)
Chloride: 104 mmol/L (ref 101–111)
GFR calc non Af Amer: >60 mL/min (ref >60)
Glucose, Bld: 97 mg/dL (ref 65–99)
Potassium: 4 mmol/L (ref 3.5–5.1)
Sodium: 136 mmol/L (ref 135–145)

## 2014-12-27 LAB — RETICULOCYTES
RBC.: 6.07 MIL/uL — ABNORMAL HIGH (ref 3.87–5.11)
RETIC COUNT ABSOLUTE: 115.3 10*3/uL (ref 19.0–186.0)
RETIC CT PCT: 1.9 % (ref 0.4–3.1)

## 2014-12-27 LAB — POC OCCULT BLOOD, ED: Fecal Occult Bld: NEGATIVE

## 2014-12-27 LAB — PREPARE RBC (CROSSMATCH)

## 2014-12-27 MED ORDER — SODIUM CHLORIDE 0.9 % IV SOLN: 10.0000 mL/h | Freq: Once | INTRAVENOUS | Status: DC

## 2014-12-27 NOTE — Discharge Instructions (Signed)
Your anemia here appears stable and does not need a transfusion. Continue your iron supplementation with breakfast, drinking orange juice with it to help it absorb. Call your insurance company to find a regular doctor, or use the list below to find one. Follow up with a regular doctor regarding your anemia. Return to the ER for changes or worsening symptoms.   Iron Deficiency Anemia Anemia is a condition in which there are less red blood cells or hemoglobin in the blood than normal. Hemoglobin is the part of red blood cells that carries oxygen. Iron deficiency anemia is anemia caused by too little iron. It is the most common type of anemia. It may leave you tired and short of breath. CAUSES   Lack of iron in the diet.  Poor absorption of iron, as seen with intestinal disorders.  Intestinal bleeding.  Heavy periods. SIGNS AND SYMPTOMS  Mild anemia may not be noticeable. Symptoms may include:  Fatigue.  Headache.  Pale skin.  Weakness.  Tiredness.  Shortness of breath.  Dizziness.  Cold hands and feet.  Fast or irregular heartbeat. DIAGNOSIS  Diagnosis requires a thorough evaluation and physical exam by your health care provider. Blood tests are generally used to confirm iron deficiency anemia. Additional tests may be done to find the underlying cause of your anemia. These may include:  Testing for blood in the stool (fecal occult blood test).  A procedure to see inside the colon and rectum (colonoscopy).  A procedure to see inside the esophagus and stomach (endoscopy). TREATMENT  Iron deficiency anemia is treated by correcting the cause of the deficiency. Treatment may involve:  Adding iron-rich foods to your diet.  Taking iron supplements. Pregnant or breastfeeding women need to take extra iron because their normal diet usually does not provide the required amount.  Taking vitamins. Vitamin C improves the absorption of iron. Your health care provider may recommend  that you take your iron tablets with a glass of orange juice or vitamin C supplement.  Medicines to make heavy menstrual flow lighter.  Surgery. HOME CARE INSTRUCTIONS   Take iron as directed by your health care provider.  If you cannot tolerate taking iron supplements by mouth, talk to your health care provider about taking them through a vein (intravenously) or an injection into a muscle.  For the best iron absorption, iron supplements should be taken on an empty stomach. If you cannot tolerate them on an empty stomach, you may need to take them with food.  Do not drink milk or take antacids at the same time as your iron supplements. Milk and antacids may interfere with the absorption of iron.  Iron supplements can cause constipation. Make sure to include fiber in your diet to prevent constipation. A stool softener may also be recommended.  Take vitamins as directed by your health care provider.  Eat a diet rich in iron. Foods high in iron include liver, lean beef, whole-grain bread, eggs, dried fruit, and dark green leafy vegetables. SEEK IMMEDIATE MEDICAL CARE IF:   You faint. If this happens, do not drive. Call your local emergency services (911 in [place] if no other help is available.  You have chest pain.  You feel nauseous or vomit.  You have severe or increased shortness of breath with activity.  You feel weak.  You have a rapid heartbeat.  You have unexplained sweating.  You become light-headed when getting up from a chair or bed. MAKE SURE YOU:   Understand these instructions.  Will watch your condition.  Will get help right away if you are not doing well or get worse. Document Released: 06/05/2000 Document Revised: 06/13/2013 Document Reviewed: 02/13/2013 [facility] Patient Information 2015 [place], [place]. This information is not intended to replace advice given to you by your health care provider. Make sure you discuss any questions you have with your health  care provider.  Abnormal Uterine Bleeding Abnormal uterine bleeding means bleeding from the vagina that is not your normal menstrual period. This can be:  Bleeding or spotting between periods.  Bleeding after sex (sexual intercourse).  Bleeding that is heavier or more than normal.  Periods that last longer than usual.  Bleeding after menopause. There are many problems that may cause this. Treatment will depend on the cause of the bleeding. Any kind of bleeding that is not normal should be reviewed by your doctor.  HOME CARE Watch your condition for any changes. These actions may lessen any discomfort you are having:  Do not use tampons or douches as told by your doctor.  Change your pads often. You should get regular pelvic exams and Pap tests. Keep all appointments for tests as told by your doctor. GET HELP IF:  You are bleeding for more than 1 week.  You feel dizzy at times. GET HELP RIGHT AWAY IF:   You pass out.  You have to change pads every 15 to 30 minutes.  You have belly pain.  You have a fever.  You become sweaty or weak.  You are passing large blood clots from the vagina.  You feel sick to your stomach (nauseous) and throw up (vomit). MAKE SURE YOU:  Understand these instructions.  Will watch your condition.  Will get help right away if you are not doing well or get worse. Document Released: 04/05/2009 Document Revised: 06/13/2013 Document Reviewed: 01/05/2013 [facility] Patient Information 2015 [place], [place]. This information is not intended to replace advice given to you by your health care provider. Make sure you discuss any questions you have with your health care provider.  Menorrhagia Menorrhagia is when your menstrual periods are heavy or last longer than usual.  HOME CARE  Only take medicine as told by your doctor.  Take any iron pills as told by your doctor. Heavy bleeding may cause low levels of iron in your body.  Do not take aspirin 1  week before or during your period. Aspirin can make the bleeding worse.  Lie down for a while if you change your tampon or pad more than once in 2 hours. This may help lessen the bleeding.  Eat a healthy diet and foods with iron. These foods include leafy green vegetables, meat, liver, eggs, and whole grain breads and cereals.  Do not try to lose weight. Wait until the heavy bleeding has stopped and your iron level is normal. GET HELP IF:  You soak through a pad or tampon every 1 or 2 hours, and this happens every time you have a period.  You need to use pads and tampons at the same time because you are bleeding so much.  You need to change your pad or tampon during the night.  You have a period that lasts for more than 8 days.  You pass clots bigger than 1 inch (2.5 cm) wide.  You have irregular periods that happen more or less often than once a month.  You feel dizzy or pass out (faint).  You feel very weak or tired.  You feel short of  breath or feel your heart is beating too fast when you exercise.  You feel sick to your stomach (nausea) and you throw up (vomit) while you are taking your medicine.   You have watery poop (diarrhea) while you are taking your medicine.  You have any problems that may be related to the medicine you are taking.  GET HELP RIGHT AWAY IF:  You soak through 4 or more pads or tampons in 2 hours.  You have any bleeding while you are pregnant. MAKE SURE YOU:   Understand these instructions.  Will watch your condition.  Will get help right away if you are not doing well or get worse. Document Released: 03/17/2008 Document Revised: 02/08/2013 Document Reviewed: 12/08/2012 [facility] Patient Information 2015 [place], [place]. This information is not intended to replace advice given to you by your health care provider. Make sure you discuss any questions you have with your health care provider.  Dizziness Dizziness is a common problem. It is a  feeling of unsteadiness or light-headedness. You may feel like you are about to faint. Dizziness can lead to injury if you stumble or fall. A person of any age group can suffer from dizziness, but dizziness is more common in older adults. CAUSES  Dizziness can be caused by many different things, including:  Middle ear problems.  Standing for too long.  Infections.  An allergic reaction.  Aging.  An emotional response to something, such as the sight of blood.  Side effects of medicines.  Tiredness.  Problems with circulation or blood pressure.  Excessive use of alcohol or medicines, or illegal drug use.  Breathing too fast (hyperventilation).  An irregular heart rhythm (arrhythmia).  A low red blood cell count (anemia).  Pregnancy.  Vomiting, diarrhea, fever, or other illnesses that cause body fluid loss (dehydration).  Diseases or conditions such as Parkinson's disease, high blood pressure (hypertension), diabetes, and thyroid problems.  Exposure to extreme heat. DIAGNOSIS  Your health care provider will ask about your symptoms, perform a physical exam, and perform an electrocardiogram (ECG) to record the electrical activity of your heart. Your health care provider may also perform other heart or blood tests to determine the cause of your dizziness. These may include:  Transthoracic echocardiogram (TTE). During echocardiography, sound waves are used to evaluate how blood flows through your heart.  Transesophageal echocardiogram (TEE).  Cardiac monitoring. This allows your health care provider to monitor your heart rate and rhythm in real time.  Holter monitor. This is a portable device that records your heartbeat and can help diagnose heart arrhythmias. It allows your health care provider to track your heart activity for several days if needed.  Stress tests by exercise or by giving medicine that makes the heart beat faster. TREATMENT  Treatment of dizziness depends  on the cause of your symptoms and can vary greatly. HOME CARE INSTRUCTIONS   Drink enough fluids to keep your urine clear or pale yellow. This is especially important in very hot weather. In older adults, it is also important in cold weather.  Take your medicine exactly as directed if your dizziness is caused by medicines. When taking blood pressure medicines, it is especially important to get up slowly.  Rise slowly from chairs and steady yourself until you feel okay.  In the morning, first sit up on the side of the bed. When you feel okay, stand slowly while holding onto something until you know your balance is fine.  Move your legs often if you need  to stand in one place for a long time. Tighten and relax your muscles in your legs while standing.  Have someone stay with you for 1-2 days if dizziness continues to be a problem. Do this until you feel you are well enough to stay alone. Have the person call your health care provider if he or she notices changes in you that are concerning.  Do not drive or use heavy machinery if you feel dizzy.  Do not drink alcohol. SEEK IMMEDIATE MEDICAL CARE IF:   Your dizziness or light-headedness gets worse.  You feel nauseous or vomit.  You have problems talking, walking, or using your arms, hands, or legs.  You feel weak.  You are not thinking clearly or you have trouble forming sentences. It may take a friend or family member to notice this.  You have chest pain, abdominal pain, shortness of breath, or sweating.  Your vision changes.  You notice any bleeding.  You have side effects from medicine that seems to be getting worse rather than better. MAKE SURE YOU:   Understand these instructions.  Will watch your condition.  Will get help right away if you are not doing well or get worse. Document Released: 12/02/2000 Document Revised: 06/13/2013 Document Reviewed: 12/26/2010 [facility] Patient Information 2015 [place], [place]. This  information is not intended to replace advice given to you by your health care provider. Make sure you discuss any questions you have with your health care provider. Emergency Department Resource Guide 1) Find a Doctor and Pay Out of Pocket Although you won't have to find out who is covered by your insurance plan, it is a good idea to ask around and get recommendations. You will then need to call the office and see if the doctor you have chosen will accept you as a new patient and what types of options they offer for patients who are self-pay. Some doctors offer discounts or will set up payment plans for their patients who do not have insurance, but you will need to ask so you aren't surprised when you get to your appointment.  2) Contact Your Local Health Department Not all health departments have doctors that can see patients for sick visits, but many do, so it is worth a call to see if yours does. If you don't know where your local health department is, you can check in your phone book. The CDC also has a tool to help you locate your state's health department, and many state websites also have listings of all of their local health departments.  3) Find a [facility] If your illness is not likely to be very severe or complicated, you may want to try a walk in clinic. These are popping up all over the country in pharmacies, drugstores, and shopping centers. They're usually staffed by nurse practitioners or physician assistants that have been trained to treat common illnesses and complaints. They're usually fairly quick and inexpensive. However, if you have serious medical issues or chronic medical problems, these are probably not your best option.  No Primary Care Doctor: - Call Health Connect at  [phone] - they can help you locate a primary care doctor that  accepts your insurance, provides certain services, etc. - Physician Referral Service- [phone]  Chronic Pain  Problems: Organization         Address  Phone   Notes  [facility]  [phone] Patients need to be referred by their primary care doctor.   Medication  Assistance: Organization         Address  Phone   Notes  [facility] Medication Assistance Program [address] [place], [place] [address] [phone] --Must be a resident of [facility] -- Must have NO insurance coverage whatsoever (no Medicaid/ Medicare, etc.) -- The pt. MUST have a primary care doctor that directs their care regularly and follows them in the community   [facility]  [phone]   [employer]  [phone]    Agencies that provide inexpensive medical care: Organization         Address  Phone   Notes  [facility]  [phone]   [facility]    [phone]   [facility] [place], [place] [address] [phone]   [place] [address], [place] [phone]   [facility]    [phone]   [facility]    [phone]   [place] [address], [place] Phone:  [phone], Fax:  [phone] Hours of Operation:  9 am - 6 pm, M-F.  Also accepts Medicaid/Medicare and self-pay.  [facility] for [place] [address], [address], [place] Phone: [phone], Fax: [phone]. Hours of Operation:  8:30 am - 5:30 pm, M-F.  Also accepts Medicaid and self-pay.  [facility] [address] [place] Phone: [phone]   [facility], [place], [place] [phone], Ext. [phone] Mondays & Thursdays: 7-9 AM.  First 15 patients are seen on a first come, first serve basis.    [place] Providers:  Organization         Address  Phone   Notes  [facility] [address],  [phone] Also  accepts self-pay patients.  [facility] [address], [place]  [phone]   [place], [address], [place] [phone]   [facility] [address] [place] [phone]   Lucianne Lei [address], [place]   [phone] Only accepts [place] patients after they have their name applied to their card.   Self-Pay (no insurance) in [facility]:  Organization         Address  Phone   Notes  Sickle Cell Patients, [facility] [place] [phone]   [facility] [place] [phone]   [facility] [place]  [place], [place], [place] [phone]   [facility]/Dr. Osei-Bonsu  [address], [place] or [address], [place] [phone] Phone number for both [place] and [place] locations is the same.  [facility] [address] [place] [phone]   [facility] [address] [place] or [address] [phone]   [facility] [address] [place] [phone], phone; [phone], fax Sees patients 1st and 3rd Saturday of every month.  Must not qualify for public or private insurance (i.e. Medicaid, Medicare, [place] Health Choice, Veterans' Benefits)  Household income should be no more than 200% of the poverty level The clinic cannot  treat you if you are pregnant or think you are pregnant  Sexually transmitted diseases are not treated at the clinic.

## 2014-12-27 NOTE — ED Notes (Signed)
Pt had blood drawn yesterday at her doctor and they called her today ans told her to come to the ED further evaluation of a low hemoglobin 6.4, pt states that she feels weak and dizzy especially after sitting for long periods and shortness of breath on exertion.

## 2014-12-27 NOTE — ED Provider Notes (Signed)
CSN: [id number]     Arrival date & time 12/27/14  2035 History   First MD Initiated Contact with Patient 12/27/14 2059     Chief Complaint  Patient presents with  . Abnormal Lab     (Consider location/radiation/quality/duration/timing/severity/associated sxs/prior Treatment) HPI Comments: Sherry Blair is a 44 y.o. female with a PMHx of gastric bypass surgery and chronic anemia (reportedly from heavy menses), who presents to the ED with complaints of abnormal labs value at her weight loss clinic in [place] office. She reports that her doctor called her to tell her that her hemoglobin was 6.9. She states she is on chronic iron therapy by mouth, but has had issues with anemia in the past and needed a transfusion. She reports generalized fatigue, shortness of breath only with exertion, and lightheadedness only with standing. She denies any fevers, chills, headache, vision changes, tinnitus or hearing loss, vertigo, chest pain, shortness of breath at rest, leg swelling, abdominal pain, nausea, vomiting, diarrhea, constipation, melena, hematochezia, dysuria, hematuria, vaginal bleeding or discharge, numbness, tingling, or weakness. She reports that her last initial period ended on Sunday, and that she has a history of very heavy menses.  Patient is a 44 y.o. female presenting with general illness. The history is provided by the patient. No language interpreter was used.  Illness Location:  Abnormal lab, hgb 6.9 Quality:  None Severity:  Moderate Onset quality:  Unable to specify Timing:  Constant Progression:  Unchanged Chronicity:  Chronic Context:  Abnormal lab drawn Relieved by:  None tried Worsened by:  None tried Ineffective treatments:  None tried Associated symptoms: fatigue and shortness of breath (only with exertion, none at rest and none ongoing)   Associated symptoms: no abdominal pain, no chest pain, no diarrhea, no fever, no headaches, no loss of consciousness, no myalgias,  no nausea and no vomiting   Risk factors:  Heavy menses, hx of anemia   History reviewed. No pertinent past medical history. Past Surgical History  Procedure Laterality Date  . Gastric bypass     Family History  Problem Relation Age of Onset  . Hyperlipidemia Mother   . Lupus Mother   . Hypertension Father   . Diabetes Father    History  Substance Use Topics  . Smoking status: Never Smoker   . Smokeless tobacco: Never Used  . Alcohol Use: Yes     Comment: Occasionally    OB History    Gravida Para Term Preterm AB TAB SAB Ectopic Multiple Living   4 2 2  2 1  1  2      Review of Systems  Constitutional: Positive for fatigue. Negative for fever and chills.  HENT: Negative for hearing loss and tinnitus.   Eyes: Negative for visual disturbance.  Respiratory: Positive for shortness of breath (only with exertion, none at rest and none ongoing).   Cardiovascular: Negative for chest pain and leg swelling.  Gastrointestinal: Negative for nausea, vomiting, abdominal pain, diarrhea, constipation, blood in stool and anal bleeding.  Genitourinary: Positive for menstrual problem (heavy menses). Negative for dysuria, hematuria, vaginal bleeding and vaginal discharge.  Musculoskeletal: Negative for myalgias and arthralgias.  Skin: Negative for color change.  Allergic/Immunologic: Negative for immunocompromised state.  Neurological: Positive for light-headedness (with standing). Negative for loss of consciousness, syncope, weakness, numbness and headaches.  Psychiatric/Behavioral: Negative for confusion.   10 Systems reviewed and are negative for acute change except as noted in the HPI.    Allergies  Review of patient's allergies indicates no  known allergies.  Home Medications   Prior to Admission medications   Medication Sig Start Date End Date Taking? Authorizing Provider  cholecalciferol (VITAMIN D) 1000 UNITS tablet Take 1,000 Units by mouth daily.   Yes Historical Provider, MD   ferrous sulfate 325 (65 FE) MG tablet Take 1 tablet (325 mg total) by mouth 3 (three) times daily with meals. 04/08/13  Yes Costin Karlyne Greenspan, MD  cephALEXin (KEFLEX) 500 MG capsule Take 1 capsule (500 mg total) by mouth 4 (four) times daily. Patient not taking: Reported on 12/27/2014 10/28/14   [name] Bombard, MD  metroNIDAZOLE (FLAGYL) 500 MG tablet Take 1 tablet (500 mg total) by mouth 2 (two) times daily. Patient not taking: Reported on 12/27/2014 06/04/14   [name] Bombard, MD  nitrofurantoin, macrocrystal-monohydrate, (MACROBID) 100 MG capsule Take 1 capsule (100 mg total) by mouth 2 (two) times daily. 1 po BID x 7days Patient not taking: Reported on 12/27/2014 06/04/14   [name] Bombard, MD   BP 121/51 mmHg  Pulse 71  Temp(Src) 98.6 F (37 C) (Oral)  Resp 13  SpO2 100%  LMP 12/22/2014 Physical Exam  Constitutional: She is oriented to person, place, and time. Vital signs are normal. She appears well-developed and well-nourished.  Non-toxic appearance. No distress.  Afebrile, nontoxic, NAD  HENT:  Head: Normocephalic and atraumatic.  Mouth/Throat: Oropharynx is clear and moist and mucous membranes are normal.  Eyes: Conjunctivae and EOM are normal. Right eye exhibits no discharge. Left eye exhibits no discharge.  Conjunctival pallor  Neck: Normal range of motion. Neck supple.  Cardiovascular: Normal rate, regular rhythm, normal heart sounds and intact distal pulses.  Exam reveals no gallop and no friction rub.   No murmur heard. RRR, nl s1/s2, no m/r/g, distal pulses intact, no pedal edema   Pulmonary/Chest: Effort normal and breath sounds normal. No respiratory distress. She has no decreased breath sounds. She has no wheezes. She has no rhonchi. She has no rales.  CTAB in all lung fields, no w/r/r, no hypoxia or increased WOB, speaking in full sentences, SpO2 100% on RA   Abdominal: Soft. Normal appearance and bowel sounds are normal. She exhibits no distension. There is no  tenderness. There is no rigidity, no rebound, no guarding, no CVA tenderness, no tenderness at McBurney's point and negative Murphy's sign.  Genitourinary: Rectum normal. Rectal exam shows no external hemorrhoid, no internal hemorrhoid, no fissure, no mass, no tenderness and anal tone normal. Guaiac negative stool. Pelvic exam was performed with patient in the knee-chest position.  Chaperone present No gross blood noted on rectal exam, normal tone, no tenderness, no mass or fissure, no hemorrhoids. FOBT neg.  Musculoskeletal: Normal range of motion.  MAE x4 Strength and sensation grossly intact Distal pulses intact No pedal edema  Neurological: She is alert and oriented to person, place, and time. She has normal strength. No sensory deficit.  Skin: Skin is warm, dry and intact. No rash noted.  Psychiatric: She has a normal mood and affect.  Nursing note and vitals reviewed.   ED Course  Procedures (including critical care time) 21:52 Orthostatic Vital Signs DS  Orthostatic Lying  - BP- Lying: 117/70 mmHg ; Pulse- Lying: 63  Orthostatic Sitting - BP- Sitting: 116/70 mmHg ; Pulse- Sitting: 74  Orthostatic Standing at 0 minutes - BP- Standing at 0 minutes: 106/71 mmHg ; Pulse- Standing at 0 minutes: 73      Labs Review Labs Reviewed  RETICULOCYTES - Abnormal; Notable for the  following:    RBC. 6.07 (*)    All other components within normal limits  CBC WITH DIFFERENTIAL/PLATELET - Abnormal; Notable for the following:    RBC 6.07 (*)    Hemoglobin 9.0 (*)    HCT 33.5 (*)    MCV 55.2 (*)    MCH 14.8 (*)    MCHC 26.9 (*)    RDW 21.1 (*)    All other components within normal limits  BASIC METABOLIC PANEL - Abnormal; Notable for the following:    Calcium 8.4 (*)    All other components within normal limits  VITAMIN B12  FOLATE  IRON AND TIBC  FERRITIN  I-STAT BETA HCG BLOOD, ED ([facility], [facility], [facility] ONLY)  POC OCCULT BLOOD, ED  PREPARE RBC (CROSSMATCH)  TYPE AND SCREEN    Imaging  Review No results found.   EKG Interpretation None      MDM   Final diagnoses:  Iron deficiency anemia  Dyspnea on exertion  Abnormal uterine bleeding (AUB)  Menorrhagia with regular cycle  Orthostatic lightheadedness    44 y.o. female here with symptomatic anemia. Labs from doctor office show Hgb 6.9 and ferritin of 6. MCV 57. Pt has heavy menses. Reports lightheadedness with standing and SOB with exertion, will get basic labs to confirm abnormal labs, and FOBT and beta HCG. Will get EKG. Will prepare for PRBC transfusion and likely admit for symptomatic anemia.   10:57 PM CBC showing stable anemia with Hgb 9.0, MCV 55.2. BMP WNL. Retics WNL. FOBT neg. EKG unremarkable. Orthostatic VS unremarkable. Given that her Hgb is 9, doubt need for transfusion tonight. Discussed that she will need to continue PO iron therapy, and f/up with a PCP from her insurance company or using the list of providers given here. Discussed f/up with OBGYN since her anemia is likely from AUB/menorrhagia. Pt feels well at this time, and feels that she could f/up with PCP for her anemia and not get transfused here given that it's not necessary. Strict return precautions given. I explained the diagnosis and have given explicit precautions to return to the ER including for any other new or worsening symptoms. The patient understands and accepts the medical plan as it's been dictated and I have answered their questions. Discharge instructions concerning home care and prescriptions have been given. The patient is STABLE and is discharged to home in good condition.  BP 121/51 mmHg  Pulse 71  Temp(Src) 98.6 F (37 C) (Oral)  Resp 13  SpO2 100%  LMP 12/22/2014     Areeb Corron Camprubi-Soms, PA-C 12/27/14 2300  Charlesetta Shanks, MD 12/31/14 [phone]

## 2014-12-28 LAB — IRON AND TIBC
Iron: 9 ug/dL — ABNORMAL LOW (ref 28–170)
Saturation Ratios: 2 % — ABNORMAL LOW (ref 10.4–31.8)
TIBC: 524 ug/dL — ABNORMAL HIGH (ref 250–450)
UIBC: 515 ug/dL

## 2014-12-28 LAB — VITAMIN B12: VITAMIN B 12: 1323 pg/mL — AB (ref 180–914)

## 2014-12-28 LAB — FOLATE: Folate: 17.1 ng/mL (ref >5.9)

## 2014-12-28 LAB — FERRITIN: Ferritin: 5 ng/mL — ABNORMAL LOW (ref 11–307)

## 2014-12-31 LAB — TYPE AND SCREEN
ABO/RH(D): B POS
Antibody Screen: NEGATIVE
UNIT DIVISION: 0
Unit division: 0

## 2015-02-28 ENCOUNTER — Ambulatory Visit: Payer: Self-pay | Admitting: Obstetrics

## 2015-03-01 ENCOUNTER — Ambulatory Visit (INDEPENDENT_AMBULATORY_CARE_PROVIDER_SITE_OTHER): Payer: BLUE CROSS/BLUE SHIELD | Admitting: Obstetrics

## 2015-03-01 ENCOUNTER — Encounter: Payer: Self-pay | Admitting: Obstetrics

## 2015-03-01 VITALS — BP 117/74 | HR 74 | Wt 192.0 lb

## 2015-03-01 DIAGNOSIS — A499 Bacterial infection, unspecified: Secondary | ICD-10-CM | POA: Diagnosis not present

## 2015-03-01 DIAGNOSIS — N760 Acute vaginitis: Secondary | ICD-10-CM | POA: Diagnosis not present

## 2015-03-01 DIAGNOSIS — N390 Urinary tract infection, site not specified: Secondary | ICD-10-CM

## 2015-03-01 DIAGNOSIS — N898 Other specified noninflammatory disorders of vagina: Secondary | ICD-10-CM

## 2015-03-01 DIAGNOSIS — B9689 Other specified bacterial agents as the cause of diseases classified elsewhere: Secondary | ICD-10-CM

## 2015-03-01 LAB — POCT URINALYSIS DIPSTICK
BILIRUBIN UA: NEGATIVE
Blood, UA: NEGATIVE
GLUCOSE UA: NEGATIVE
Ketones, UA: NEGATIVE
LEUKOCYTES UA: NEGATIVE
NITRITE UA: NEGATIVE
PH UA: 5
Protein, UA: NEGATIVE
Spec Grav, UA: 1.02
Urobilinogen, UA: NEGATIVE

## 2015-03-01 NOTE — Progress Notes (Signed)
Patient ID: Sherry Blair, female   DOB: 24-Jul-1970, 44 y.o.   MRN: [id number]  Chief Complaint  Patient presents with  . Vaginitis    recurring UTI's, vaginal irritation, slight odor    HPI Sherry Blair is a 44 y.o. female.  C/O vaginal discharge with slight odor and irritation.  Also c/o urinary frequency.  HPI  History reviewed. No pertinent past medical history.  Past Surgical History  Procedure Laterality Date  . Gastric bypass      Family History  Problem Relation Age of Onset  . Hyperlipidemia Mother   . Lupus Mother   . Hypertension Father   . Diabetes Father     Social History Social History  Substance Use Topics  . Smoking status: Never Smoker   . Smokeless tobacco: Never Used  . Alcohol Use: Yes     Comment: Occasionally     No Known Allergies  Current Outpatient Prescriptions  Medication Sig Dispense Refill  . Ascorbic Acid (VITAMIN C) 100 MG tablet Take 100 mg by mouth daily.    . cholecalciferol (VITAMIN D) 1000 UNITS tablet Take 1,000 Units by mouth daily.    . ferrous sulfate 325 (65 FE) MG tablet Take 1 tablet (325 mg total) by mouth 3 (three) times daily with meals. 90 tablet 3  . metroNIDAZOLE (FLAGYL) 500 MG tablet Take 1 tablet (500 mg total) by mouth 2 (two) times daily. (Patient not taking: Reported on 12/27/2014) 14 tablet 2  . nitrofurantoin, macrocrystal-monohydrate, (MACROBID) 100 MG capsule Take 1 capsule (100 mg total) by mouth 2 (two) times daily. 1 po BID x 7days (Patient not taking: Reported on 12/27/2014) 14 capsule 2   No current facility-administered medications for this visit.    Review of Systems Review of Systems Constitutional: negative for fatigue and weight loss Respiratory: negative for cough and wheezing Cardiovascular: negative for chest pain, fatigue and palpitations Gastrointestinal: negative for abdominal pain and change in bowel habits Genitourinary:negative Integument/breast: negative for nipple  discharge Musculoskeletal:negative for myalgias Neurological: negative for gait problems and tremors Behavioral/Psych: negative for abusive relationship, depression Endocrine: negative for temperature intolerance     Blood pressure 117/74, pulse 74, weight 192 lb (87.091 kg), last menstrual period 02/04/2015.  Physical Exam Physical Exam General:   alert  Skin:   no rash or abnormalities  Lungs:   clear to auscultation bilaterally  Heart:   regular rate and rhythm, S1, S2 normal, no murmur, click, rub or gallop  Breasts:   normal without suspicious masses, skin or nipple changes or axillary nodes  Abdomen:  normal findings: no organomegaly, soft, non-tender and no hernia  Pelvis:  External genitalia: normal general appearance Urinary system: urethral meatus normal and bladder without fullness, nontender Vaginal: normal without tenderness, induration or masses Cervix: normal appearance Adnexa: normal bimanual exam Uterus: anteverted and non-tender, normal size      Data Reviewed Labs  Assessment     Vaginal discharge Probable BV     Plan    Wet and cultures sent Tindamax dispensed   Orders Placed This Encounter  Procedures  . SureSwab, Vaginosis/Vaginitis Plus  . POCT urinalysis dipstick   Meds ordered this encounter  Medications  . Ascorbic Acid (VITAMIN C) 100 MG tablet    Sig: Take 100 mg by mouth daily.

## 2015-03-08 ENCOUNTER — Other Ambulatory Visit: Payer: Self-pay | Admitting: Obstetrics

## 2015-03-08 DIAGNOSIS — B9689 Other specified bacterial agents as the cause of diseases classified elsewhere: Secondary | ICD-10-CM

## 2015-03-08 DIAGNOSIS — N760 Acute vaginitis: Secondary | ICD-10-CM

## 2015-03-08 DIAGNOSIS — B373 Candidiasis of vulva and vagina: Secondary | ICD-10-CM

## 2015-03-08 DIAGNOSIS — B3731 Acute candidiasis of vulva and vagina: Secondary | ICD-10-CM

## 2015-03-08 LAB — SURESWAB, VAGINOSIS/VAGINITIS PLUS
BV CATEGORY: UNDETERMINED — AB
C. PARAPSILOSIS, DNA: DETECTED — AB
C. albicans, DNA: NOT DETECTED
C. glabrata, DNA: NOT DETECTED
C. trachomatis RNA, TMA: NOT DETECTED
C. tropicalis, DNA: NOT DETECTED
Gardnerella vaginalis: >8 Log (cells/mL)
LACTOBACILLUS SPECIES: 6.2 Log (cells/mL)
MEGASPHAERA SPECIES: NOT DETECTED Log (cells/mL)
N. gonorrhoeae RNA, TMA: NOT DETECTED
T. vaginalis RNA, QL TMA: NOT DETECTED

## 2015-03-08 MED ORDER — FLUCONAZOLE 150 MG PO TABS: 150.0000 mg | ORAL_TABLET | Freq: Once | ORAL | Status: DC

## 2015-03-08 MED ORDER — METRONIDAZOLE 500 MG PO TABS: 500.0000 mg | ORAL_TABLET | Freq: Two times a day (BID) | ORAL | Status: DC

## 2015-03-25 ENCOUNTER — Ambulatory Visit (INDEPENDENT_AMBULATORY_CARE_PROVIDER_SITE_OTHER): Payer: BLUE CROSS/BLUE SHIELD | Admitting: Obstetrics

## 2015-03-25 ENCOUNTER — Encounter: Payer: Self-pay | Admitting: Obstetrics

## 2015-03-25 VITALS — BP 118/70 | HR 75 | Temp 98.6°F | Ht 63.0 in | Wt 194.0 lb

## 2015-03-25 DIAGNOSIS — Z01419 Encounter for gynecological examination (general) (routine) without abnormal findings: Secondary | ICD-10-CM | POA: Diagnosis not present

## 2015-03-25 DIAGNOSIS — Z1239 Encounter for other screening for malignant neoplasm of breast: Secondary | ICD-10-CM

## 2015-03-25 DIAGNOSIS — B3731 Acute candidiasis of vulva and vagina: Secondary | ICD-10-CM

## 2015-03-25 DIAGNOSIS — B373 Candidiasis of vulva and vagina: Secondary | ICD-10-CM

## 2015-03-25 MED ORDER — TERCONAZOLE 0.4 % VA CREA: 1.0000 | TOPICAL_CREAM | Freq: Every day | VAGINAL | Status: DC

## 2015-03-25 NOTE — Progress Notes (Signed)
Subjective:        Sherry Blair is a 44 y.o. female here for a routine exam.  Current complaints: None.    Personal health questionnaire:  Is patient Sherry Blair, have a family history of breast and/or ovarian cancer: no Is there a family history of uterine cancer diagnosed at age < 60, gastrointestinal cancer, urinary tract cancer, family member who is a Field seismologist syndrome-associated carrier: no Is the patient overweight and hypertensive, family history of diabetes, personal history of gestational diabetes, preeclampsia or PCOS: no Is patient over 70, have PCOS,  family history of premature CHD under age 68, diabetes, smoke, have hypertension or peripheral artery disease:  no At any time, has a partner hit, kicked or otherwise hurt or frightened you?: no Over the past 2 weeks, have you felt down, depressed or hopeless?: no Over the past 2 weeks, have you felt little interest or pleasure in doing things?:no   Gynecologic History Patient's last menstrual period was 03/12/2015. Contraception: none Last Pap: 2015. Results were: normal Last mammogram: None. Results were: n/a  Obstetric History OB History  Gravida Para Term Preterm AB SAB TAB Ectopic Multiple Living  4 2 2  2  1 1  2     # Outcome Date GA Lbr Len/2nd Weight Sex Delivery Anes PTL Lv  4 Ectopic 2011 [redacted]w[redacted]d       N  3 Term 09/19/97 101w0d  7 lb 8 oz (3.402 kg) M Vag-Spont EPI  Y  2 Term 08/24/96 [redacted]w[redacted]d  7 lb 6 oz (3.345 kg) F Vag-Spont EPI  Y  1 TAB         N      History reviewed. No pertinent past medical history.  Past Surgical History  Procedure Laterality Date  . Gastric bypass       Current outpatient prescriptions:  .  Ascorbic Acid (VITAMIN C) 100 MG tablet, Take 100 mg by mouth daily., Disp: , Rfl:  .  cholecalciferol (VITAMIN D) 1000 UNITS tablet, Take 1,000 Units by mouth daily., Disp: , Rfl:  .  metroNIDAZOLE (FLAGYL) 500 MG tablet, Take 1 tablet (500 mg total) by mouth 2 (two) times daily., Disp:  14 tablet, Rfl: 2 .  ferrous sulfate 325 (65 FE) MG tablet, Take 1 tablet (325 mg total) by mouth 3 (three) times daily with meals. (Patient not taking: Reported on 03/25/2015), Disp: 90 tablet, Rfl: 3 .  terconazole (TERAZOL 7) 0.4 % vaginal cream, Place 1 applicator vaginally at bedtime., Disp: 45 g, Rfl: 2 No Known Allergies  Social History  Substance Use Topics  . Smoking status: Never Smoker   . Smokeless tobacco: Never Used  . Alcohol Use: 0.0 oz/week    0 Standard drinks or equivalent per week     Comment: Occasionally     Family History  Problem Relation Age of Onset  . Hyperlipidemia Mother   . Lupus Mother   . Hypertension Father   . Diabetes Father       Review of Systems  Constitutional: negative for fatigue and weight loss Respiratory: negative for cough and wheezing Cardiovascular: negative for chest pain, fatigue and palpitations Gastrointestinal: negative for abdominal pain and change in bowel habits Musculoskeletal:negative for myalgias Neurological: negative for gait problems and tremors Behavioral/Psych: negative for abusive relationship, depression Endocrine: negative for temperature intolerance   Genitourinary:negative for abnormal menstrual periods, genital lesions, hot flashes, sexual problems and vaginal discharge Integument/breast: negative for breast lump, breast tenderness, nipple discharge and skin lesion(s)  Objective:       BP 118/70 mmHg  Pulse 75  Temp(Src) 98.6 F (37 C)  Ht 5\' 3"  (1.6 m)  Wt 194 lb (87.998 kg)  BMI 34.37 kg/m2  LMP 03/12/2015 General:   alert  Skin:   no rash or abnormalities  Lungs:   clear to auscultation bilaterally  Heart:   regular rate and rhythm, S1, S2 normal, no murmur, click, rub or gallop  Breasts:   normal without suspicious masses, skin or nipple changes or axillary nodes  Abdomen:  normal findings: no organomegaly, soft, non-tender and no hernia  Pelvis:  External genitalia: normal general  appearance Urinary system: urethral meatus normal and bladder without fullness, nontender Vaginal: normal without tenderness, induration or masses Cervix: normal appearance Adnexa: normal bimanual exam Uterus: anteverted and non-tender, normal size   Lab Review Urine pregnancy test Labs reviewed yes Radiologic studies reviewed no    Assessment:    Healthy female exam.    Plan:    Education reviewed: calcium supplements, low fat, low cholesterol diet, safe sex/STD prevention, self breast exams and weight bearing exercise. Contraception: none. Mammogram ordered. Follow up in: 1 year.   Meds ordered this encounter  Medications  . terconazole (TERAZOL 7) 0.4 % vaginal cream    Sig: Place 1 applicator vaginally at bedtime.    Dispense:  45 g    Refill:  2   Orders Placed This Encounter  Procedures  . MM Digital Screening    No problems/no history of breast cancer/no implants/no reductions/no needs Pf none Hb/Barb Ins/bcbs  Epic order    Standing Status: Future     Number of Occurrences:      Standing Expiration Date: 05/24/2016    Order Specific Question:  Reason for Exam (SYMPTOM  OR DIAGNOSIS REQUIRED)    Answer:  screening    Order Specific Question:  Is the patient pregnant?    Answer:  No    Order Specific Question:  Preferred imaging location?    Answer:  [facility]

## 2015-03-27 LAB — PAP, TP IMAGING W/ HPV RNA, RFLX HPV TYPE 16,18/45: HPV mRNA, High Risk: NOT DETECTED

## 2015-04-03 ENCOUNTER — Ambulatory Visit: Payer: BLUE CROSS/BLUE SHIELD

## 2015-09-27 ENCOUNTER — Ambulatory Visit (INDEPENDENT_AMBULATORY_CARE_PROVIDER_SITE_OTHER): Payer: 59 | Admitting: Certified Nurse Midwife

## 2015-09-27 ENCOUNTER — Encounter: Payer: Self-pay | Admitting: Obstetrics

## 2015-09-27 VITALS — BP 111/69 | HR 83 | Temp 98.7°F | Wt 191.0 lb

## 2015-09-27 DIAGNOSIS — N760 Acute vaginitis: Secondary | ICD-10-CM

## 2015-09-27 DIAGNOSIS — N898 Other specified noninflammatory disorders of vagina: Secondary | ICD-10-CM | POA: Diagnosis not present

## 2015-09-27 DIAGNOSIS — B9689 Other specified bacterial agents as the cause of diseases classified elsewhere: Secondary | ICD-10-CM

## 2015-09-27 DIAGNOSIS — A499 Bacterial infection, unspecified: Secondary | ICD-10-CM | POA: Diagnosis not present

## 2015-09-27 LAB — POCT URINALYSIS DIPSTICK
Bilirubin, UA: NEGATIVE
GLUCOSE UA: NEGATIVE
Ketones, UA: NEGATIVE
Leukocytes, UA: NEGATIVE
Nitrite, UA: NEGATIVE
PH UA: 5
Protein, UA: NEGATIVE
RBC UA: NEGATIVE
SPEC GRAV UA: 1.015
UROBILINOGEN UA: NEGATIVE

## 2015-09-27 MED ORDER — FLUCONAZOLE 100 MG PO TABS: 100.0000 mg | ORAL_TABLET | Freq: Once | ORAL | Status: DC

## 2015-09-27 MED ORDER — TERCONAZOLE 0.4 % VA CREA: 1.0000 | TOPICAL_CREAM | Freq: Every day | VAGINAL | Status: DC

## 2015-09-27 MED ORDER — METRONIDAZOLE 500 MG PO TABS: 500.0000 mg | ORAL_TABLET | Freq: Two times a day (BID) | ORAL | Status: DC

## 2015-09-27 NOTE — Addendum Note (Signed)
Addended by: Lewie Loron D on: 09/27/2015 01:48 PM   Modules accepted: Orders

## 2015-09-27 NOTE — Progress Notes (Signed)
Patient ID: Sherry Blair Reason, female   DOB: [birth year], 45 y.o.   MRN: [id number]  Chief Complaint  Patient presents with  . Office Visit    HPI Sherry Blair is a 45 y.o. female.  Here for chronic vaginal discharge, sometimes with odor and yellowish color, states that she is having a lot of discharge.  Is about mid-cycle.  Period is due around the end of April.  States has been having this discharge for about 3 months. Uses condoms for birth control.  Denies being currently sexually active with her boyfriend.   States periods are normal.  Does having increased urination but no other symptoms.    HPI  No past medical history on file.  Past Surgical History  Procedure Laterality Date  . Gastric bypass      Family History  Problem Relation Age of Onset  . Hyperlipidemia Mother   . Lupus Mother   . Hypertension Father   . Diabetes Father     Social History Social History  Substance Use Topics  . Smoking status: Never Smoker   . Smokeless tobacco: Never Used  . Alcohol Use: 0.0 oz/week    0 Standard drinks or equivalent per week     Comment: Occasionally     No Known Allergies  Current Outpatient Prescriptions  Medication Sig Dispense Refill  . Ascorbic Acid (VITAMIN C) 100 MG tablet Take 100 mg by mouth daily.    . cholecalciferol (VITAMIN D) 1000 UNITS tablet Take 1,000 Units by mouth daily.    . ferrous sulfate 325 (65 FE) MG tablet Take 1 tablet (325 mg total) by mouth 3 (three) times daily with meals. 90 tablet 3  . fluconazole (DIFLUCAN) 100 MG tablet Take 1 tablet (100 mg total) by mouth once. Repeat dose in 48-72 hour. 3 tablet 0  . metroNIDAZOLE (FLAGYL) 500 MG tablet Take 1 tablet (500 mg total) by mouth 2 (two) times daily. 14 tablet 0  . terconazole (TERAZOL 7) 0.4 % vaginal cream Place 1 applicator vaginally at bedtime. 45 g 0   No current facility-administered medications for this visit.    Review of Systems Review of Systems Constitutional: negative  for fatigue and weight loss Respiratory: negative for cough and wheezing Cardiovascular: negative for chest pain, fatigue and palpitations Gastrointestinal: negative for abdominal pain and change in bowel habits Genitourinary: + vaginal discharge Integument/breast: negative for nipple discharge Musculoskeletal:negative for myalgias Neurological: negative for gait problems and tremors Behavioral/Psych: negative for abusive relationship, depression Endocrine: negative for temperature intolerance     Blood pressure 111/69, pulse 83, temperature 98.7 F (37.1 C), weight 191 lb (86.637 kg).  Physical Exam Physical Exam General:   alert  Skin:   no rash or abnormalities  Lungs:   clear to auscultation bilaterally  Heart:   regular rate and rhythm, S1, S2 normal, no murmur, click, rub or gallop  Breasts:   deferred  Abdomen:  normal findings: no organomegaly, soft, non-tender and no hernia  Pelvis:  External genitalia: normal general appearance Urinary system: urethral meatus normal and bladder without fullness, nontender Vaginal: normal without tenderness, induration or masses Cervix: normal appearance Adnexa: normal bimanual exam Uterus: anteverted and non-tender, normal size    50% of 15 min visit spent on counseling and coordination of care.   Data Reviewed Previous medical hx, meds  Assessment     BV Vaginitis     Plan    Orders Placed This Encounter  Procedures  . POCT urinalysis dipstick  Meds ordered this encounter  Medications  . metroNIDAZOLE (FLAGYL) 500 MG tablet    Sig: Take 1 tablet (500 mg total) by mouth 2 (two) times daily.    Dispense:  14 tablet    Refill:  0  . fluconazole (DIFLUCAN) 100 MG tablet    Sig: Take 1 tablet (100 mg total) by mouth once. Repeat dose in 48-72 hour.    Dispense:  3 tablet    Refill:  0  . terconazole (TERAZOL 7) 0.4 % vaginal cream    Sig: Place 1 applicator vaginally at bedtime.    Dispense:  45 g    Refill:  0     Possible management options include: chronic BV treatment: metrogel, boric acid Follow up as needed.

## 2015-09-30 LAB — NUSWAB VAGINITIS PLUS (VG+)
Atopobium vaginae: HIGH Score — AB
CANDIDA ALBICANS, NAA: NEGATIVE
Candida glabrata, NAA: NEGATIVE
Chlamydia trachomatis, NAA: NEGATIVE
NEISSERIA GONORRHOEAE, NAA: NEGATIVE
TRICH VAG BY NAA: NEGATIVE

## 2015-12-03 ENCOUNTER — Observation Stay (HOSPITAL_COMMUNITY)
Admission: EM | Admit: 2015-12-03 | Discharge: 2015-12-04 | Disposition: A | Discharge status: Home / Self Care | Payer: 59 | Attending: Internal Medicine | Admitting: Internal Medicine

## 2015-12-03 ENCOUNTER — Encounter (HOSPITAL_COMMUNITY): Payer: Self-pay | Admitting: Family Medicine

## 2015-12-03 DIAGNOSIS — R888 Abnormal findings in other body fluids and substances: Secondary | ICD-10-CM | POA: Diagnosis present

## 2015-12-03 DIAGNOSIS — Z9884 Bariatric surgery status: Secondary | ICD-10-CM | POA: Diagnosis not present

## 2015-12-03 DIAGNOSIS — D500 Iron deficiency anemia secondary to blood loss (chronic): Principal | ICD-10-CM | POA: Insufficient documentation

## 2015-12-03 DIAGNOSIS — D649 Anemia, unspecified: Secondary | ICD-10-CM | POA: Diagnosis present

## 2015-12-03 LAB — CBC WITH DIFFERENTIAL/PLATELET
BASOS ABS: 0.1 10*3/uL (ref 0.0–0.1)
BASOS PCT: 1 %
EOS PCT: 3 %
Eosinophils Absolute: 0.2 10*3/uL (ref 0.0–0.7)
HCT: 17.7 % — ABNORMAL LOW (ref 36.0–46.0)
Hemoglobin: 4.3 g/dL — CL (ref 12.0–15.0)
Lymphocytes Relative: 40 %
Lymphs Abs: 2.7 10*3/uL (ref 0.7–4.0)
MCH: 12.1 pg — ABNORMAL LOW (ref 26.0–34.0)
MCHC: 24.3 g/dL — AB (ref 30.0–36.0)
MCV: 50 fL — ABNORMAL LOW (ref 78.0–100.0)
MONO ABS: 0.4 10*3/uL (ref 0.1–1.0)
Monocytes Relative: 7 %
Neutro Abs: 3.3 10*3/uL (ref 1.7–7.7)
Neutrophils Relative %: 49 %
PLATELETS: 433 10*3/uL — AB (ref 150–400)
RBC: 3.54 MIL/uL — AB (ref 3.87–5.11)
RDW: 23.1 % — ABNORMAL HIGH (ref 11.5–15.5)
WBC: 6.7 10*3/uL (ref 4.0–10.5)

## 2015-12-03 LAB — COMPREHENSIVE METABOLIC PANEL
ALT: 12 U/L — ABNORMAL LOW (ref 14–54)
ANION GAP: 6 (ref 5–15)
AST: 18 U/L (ref 15–41)
Albumin: 3.9 g/dL (ref 3.5–5.0)
Alkaline Phosphatase: 51 U/L (ref 38–126)
BUN: 11 mg/dL (ref 6–20)
CHLORIDE: 109 mmol/L (ref 101–111)
CO2: 24 mmol/L (ref 22–32)
Calcium: 8.4 mg/dL — ABNORMAL LOW (ref 8.9–10.3)
Creatinine, Ser: 0.64 mg/dL (ref 0.44–1.00)
Glucose, Bld: 112 mg/dL — ABNORMAL HIGH (ref 65–99)
Potassium: 4.2 mmol/L (ref 3.5–5.1)
SODIUM: 139 mmol/L (ref 135–145)
Total Bilirubin: 0.6 mg/dL (ref 0.3–1.2)
Total Protein: 7.3 g/dL (ref 6.5–8.1)

## 2015-12-03 LAB — PROTIME-INR
INR: 1.08 (ref 0.00–1.49)
Prothrombin Time: 13.7 seconds (ref 11.6–15.2)

## 2015-12-03 LAB — POC OCCULT BLOOD, ED: Fecal Occult Bld: POSITIVE — AB

## 2015-12-03 NOTE — ED Provider Notes (Signed)
CSN: [id number]     Arrival date & time 12/03/15  2033 History  By signing my name below, I, Emmanuella Mensah, attest that this documentation has been prepared under the direction and in the presence of Jayant Kriz, MD. Electronically Signed: Judithann Sauger, ED Scribe. 12/03/2015. 11:38 PM.    Chief Complaint  Patient presents with  . Abnormal Lab   Patient is a 45 y.o. female presenting with general illness. The history is provided by the patient. No language interpreter was used.  Illness Location:  Fatigue and leg swelling Severity:  Moderate Onset quality:  Gradual Timing:  Constant Progression:  Worsening Chronicity:  New Context:  Low hemoglobin Relieved by:  Nothing Worsened by:  Nothing tried Associated symptoms: fatigue, headaches and shortness of breath   Associated symptoms: no chest pain, no fever, no nausea, no vomiting and no wheezing   Risk factors:  Not taking her iron  HPI Comments: Sherry Blair is a 45 y.o. female with a hx of blood transfusion without reported diagnosis who presents to the Emergency Department requesting evaluation for abnormal lab results. Pt was seen at Urgent Care in [place] today for intermittent SOB that is worse with exertion, constant generalized HA for several days, and leg swelling. She explains that she was told she had abnormal lab work there with a HGB of 4.8 and advised to come to the ED. She denies any recent falls, injuries, or trauma. She also denies a hx of ulcers or excessive extended NSAID or goody powder use. No alleviating factors noted. She states that she has not been on her iron pills for 6 months. Pt is currently on her menstrual cycle and she adds that it normally lasts for 5 days (2 heavy and 3 spotty). She denies any n/v, dark tarry stool, coffee grounds stool/vomiting, or blood in stool.   No PCP.    Past Medical History  Diagnosis Date  . Blood transfusion without reported diagnosis    Past Surgical History   Procedure Laterality Date  . Gastric bypass    . Gastric bypass     Family History  Problem Relation Age of Onset  . Hyperlipidemia Mother   . Lupus Mother   . Hypertension Father   . Diabetes Father    Social History  Substance Use Topics  . Smoking status: Never Smoker   . Smokeless tobacco: Never Used  . Alcohol Use: No   OB History    Gravida Para Term Preterm AB TAB SAB Ectopic Multiple Living   4 2 2  2 1  1  2      Review of Systems  Constitutional: Positive for fatigue. Negative for fever.  Respiratory: Positive for shortness of breath. Negative for wheezing.   Cardiovascular: Positive for leg swelling. Negative for chest pain and palpitations.  Gastrointestinal: Negative for nausea, vomiting, blood in stool and anal bleeding.  Neurological: Positive for headaches.  All other systems reviewed and are negative.     Allergies  Review of patient's allergies indicates no known allergies.  Home Medications   Prior to Admission medications   Medication Sig Start Date End Date Taking? Authorizing Provider  ferrous sulfate 325 (65 FE) MG tablet Take 1 tablet (325 mg total) by mouth 3 (three) times daily with meals. Patient not taking: Reported on 12/03/2015 04/08/13   Caren Griffins, MD  fluconazole (DIFLUCAN) 100 MG tablet Take 1 tablet (100 mg total) by mouth once. Repeat dose in 48-72 hour. Patient not taking: Reported on  12/03/2015 09/27/15   Rachelle A Denney, CNM  metroNIDAZOLE (FLAGYL) 500 MG tablet Take 1 tablet (500 mg total) by mouth 2 (two) times daily. Patient not taking: Reported on 12/03/2015 09/27/15   Rachelle A Denney, CNM  terconazole (TERAZOL 7) 0.4 % vaginal cream Place 1 applicator vaginally at bedtime. Patient not taking: Reported on 12/03/2015 09/27/15   Rachelle A Denney, CNM   BP 124/66 mmHg  Pulse 78  Temp(Src) 98.7 F (37.1 C) (Oral)  Resp 17  Ht 5\' 3"  (1.6 m)  Wt 192 lb (87.091 kg)  BMI 34.02 kg/m2  SpO2 100%  LMP 12/01/2015 Physical  Exam  Constitutional: She is oriented to person, place, and time. She appears well-developed and well-nourished.  HENT:  Head: Normocephalic and atraumatic.  Mouth/Throat: Oropharynx is clear and moist. No oropharyngeal exudate.  Tracheas midline  Eyes: Pupils are equal, round, and reactive to light.  Yellow conjunctiva  Neck: Normal range of motion. Neck supple.  No bruits   Cardiovascular: Normal rate, regular rhythm and intact distal pulses.   Pulmonary/Chest: Effort normal. No stridor. She has no wheezes. She has no rales.  Lungs are clear  Abdominal: Soft. Bowel sounds are normal. There is no tenderness. There is no rebound and no guarding.  Musculoskeletal: Normal range of motion. She exhibits no edema or tenderness.  All compartments soft, no pitting edema, intact DP, intact DTRs.   Neurological: She is alert and oriented to person, place, and time. She has normal reflexes.  Skin: Skin is warm and dry. There is pallor.  Psychiatric: She has a normal mood and affect.  Nursing note and vitals reviewed.   ED Course  Procedures (including critical care time) DIAGNOSTIC STUDIES: Oxygen Saturation is 100% on RA, normal by my interpretation.    COORDINATION OF CARE: 11:34 PM- Pt advised of plan for treatment and pt agrees. Pt will be admitted for blood transfusion.    Labs Review Labs Reviewed  CBC WITH DIFFERENTIAL/PLATELET - Abnormal; Notable for the following:    RBC 3.54 (*)    Hemoglobin 4.3 (*)    HCT 17.7 (*)    MCV 50.0 (*)    MCH 12.1 (*)    MCHC 24.3 (*)    RDW 23.1 (*)    Platelets 433 (*)    All other components within normal limits  COMPREHENSIVE METABOLIC PANEL - Abnormal; Notable for the following:    Glucose, Bld 112 (*)    Calcium 8.4 (*)    ALT 12 (*)    All other components within normal limits  POC OCCULT BLOOD, ED - Abnormal; Notable for the following:    Fecal Occult Bld POSITIVE (*)    All other components within normal limits  PROTIME-INR   TYPE AND SCREEN    Imaging Review No results found.   Alder Murri, MD has personally reviewed and evaluated these images and lab results as part of her medical decision-making.   EKG Interpretation None      MDM   Final diagnoses:  None   Filed Vitals:   12/03/15 2314 12/03/15 2316  BP: 124/66   Pulse: 78   Temp:    Resp:  17   Results for orders placed or performed during the hospital encounter of 12/03/15  CBC with Differential  Result Value Ref Range   WBC 6.7 4.0 - 10.5 K/uL   RBC 3.54 (L) 3.87 - 5.11 MIL/uL   Hemoglobin 4.3 (LL) 12.0 - 15.0 g/dL   HCT 17.7 (L)  36.0 - 46.0 %   MCV 50.0 (L) 78.0 - 100.0 fL   MCH 12.1 (L) 26.0 - 34.0 pg   MCHC 24.3 (L) 30.0 - 36.0 g/dL   RDW 23.1 (H) 11.5 - 15.5 %   Platelets 433 (H) 150 - 400 K/uL   Neutrophils Relative % 49 %   Neutro Abs 3.3 1.7 - 7.7 K/uL   Lymphocytes Relative 40 %   Lymphs Abs 2.7 0.7 - 4.0 K/uL   Monocytes Relative 7 %   Monocytes Absolute 0.4 0.1 - 1.0 K/uL   Eosinophils Relative 3 %   Eosinophils Absolute 0.2 0.0 - 0.7 K/uL   Basophils Relative 1 %   Basophils Absolute 0.1 0.0 - 0.1 K/uL   RBC Morphology ELLIPTOCYTES   Protime-INR  Result Value Ref Range   Prothrombin Time 13.7 11.6 - 15.2 seconds   INR 1.08 0.00 - 1.49  Comprehensive metabolic panel  Result Value Ref Range   Sodium 139 135 - 145 mmol/L   Potassium 4.2 3.5 - 5.1 mmol/L   Chloride 109 101 - 111 mmol/L   CO2 24 22 - 32 mmol/L   Glucose, Bld 112 (H) 65 - 99 mg/dL   BUN 11 6 - 20 mg/dL   Creatinine, Ser 0.64 0.44 - 1.00 mg/dL   Calcium 8.4 (L) 8.9 - 10.3 mg/dL   Total Protein 7.3 6.5 - 8.1 g/dL   Albumin 3.9 3.5 - 5.0 g/dL   AST 18 15 - 41 U/L   ALT 12 (L) 14 - 54 U/L   Alkaline Phosphatase 51 38 - 126 U/L   Total Bilirubin 0.6 0.3 - 1.2 mg/dL   GFR calc non Af Amer >60 >60 mL/min   GFR calc Af Amer >60 >60 mL/min   Anion gap 6 5 - 15  POC occult blood, ED RN will collect  Result Value Ref Range   Fecal Occult Bld  POSITIVE (A) NEGATIVE  Type and screen [place]  Result Value Ref Range   ABO/RH(D) B POS    Antibody Screen NEG    Sample Expiration 12/06/2015    Unit Number [id number]    Blood Component Type RED CELLS,LR    Unit division 00    Status of Unit ISSUED    Transfusion Status OK TO TRANSFUSE    Crossmatch Result Compatible    Unit Number [id number]    Blood Component Type RED CELLS,LR    Unit division 00    Status of Unit ALLOCATED    Transfusion Status OK TO TRANSFUSE    Crossmatch Result Compatible    Unit Number [id number]    Blood Component Type RED CELLS,LR    Unit division 00    Status of Unit ALLOCATED    Transfusion Status OK TO TRANSFUSE    Crossmatch Result Compatible    Unit Number [id number]    Blood Component Type RED CELLS,LR    Unit division 00    Status of Unit ALLOCATED    Transfusion Status OK TO TRANSFUSE    Crossmatch Result Compatible    Unit Number [id number]    Blood Component Type RED CELLS,LR    Unit division 00    Status of Unit ALLOCATED    Transfusion Status OK TO TRANSFUSE    Crossmatch Result Compatible    No results found.  Medications  0.9 %  sodium chloride infusion (not administered)  acetaminophen (TYLENOL) tablet 650 mg (not administered)  diphenhydrAMINE (BENADRYL) capsule 25 mg (not administered)  sodium chloride 0.9 % bolus 1,000 mL (1,000 mLs Intravenous New Bag/Given 12/04/15 0013)    Will admit for transfusion    I personally performed the services described in this documentation, which was scribed in my presence. The recorded information has been reviewed and is accurate.     Veatrice Kells, MD 12/04/15 [phone]

## 2015-12-03 NOTE — ED Notes (Signed)
Patient was seen at [facility] today for shortness of breath, fatigue, palpitations, and swelling of lower extremities. Blood work was performed with an abnormal lab result of HGB 4.8, unknown HCT, and Platelet Ct 618. Pt had a EKG performed with being in normal sinus rhythm.

## 2015-12-03 NOTE — ED Notes (Signed)
Blood draw attempt unsuccessful. RN notified. 

## 2015-12-04 ENCOUNTER — Encounter (HOSPITAL_COMMUNITY): Payer: Self-pay | Admitting: Emergency Medicine

## 2015-12-04 DIAGNOSIS — D649 Anemia, unspecified: Secondary | ICD-10-CM | POA: Diagnosis not present

## 2015-12-04 DIAGNOSIS — D500 Iron deficiency anemia secondary to blood loss (chronic): Principal | ICD-10-CM

## 2015-12-04 DIAGNOSIS — R195 Other fecal abnormalities: Secondary | ICD-10-CM

## 2015-12-04 LAB — RETICULOCYTES
RBC.: 3.55 MIL/uL — ABNORMAL LOW (ref 3.87–5.11)
Retic Count, Absolute: 46.2 10*3/uL (ref 19.0–186.0)
Retic Ct Pct: 1.3 % (ref 0.4–3.1)

## 2015-12-04 LAB — VITAMIN B12: VITAMIN B 12: 283 pg/mL (ref 180–914)

## 2015-12-04 LAB — FOLATE: FOLATE: 30.6 ng/mL (ref >5.9)

## 2015-12-04 LAB — CBC
HCT: 27.5 % — ABNORMAL LOW (ref 36.0–46.0)
Hemoglobin: 8.1 g/dL — ABNORMAL LOW (ref 12.0–15.0)
MCH: 17.7 pg — AB (ref 26.0–34.0)
MCHC: 29.5 g/dL — ABNORMAL LOW (ref 30.0–36.0)
MCV: 60.2 fL — ABNORMAL LOW (ref 78.0–100.0)
PLATELETS: 362 10*3/uL (ref 150–400)
RBC: 4.57 MIL/uL (ref 3.87–5.11)
RDW: 33.8 % — ABNORMAL HIGH (ref 11.5–15.5)
WBC: 6.4 10*3/uL (ref 4.0–10.5)

## 2015-12-04 LAB — IRON AND TIBC
Iron: 7 ug/dL — ABNORMAL LOW (ref 28–170)
SATURATION RATIOS: 1 % — AB (ref 10.4–31.8)
TIBC: 472 ug/dL — AB (ref 250–450)
UIBC: 465 ug/dL

## 2015-12-04 LAB — FERRITIN: Ferritin: 2 ng/mL — ABNORMAL LOW (ref 11–307)

## 2015-12-04 MED ORDER — SODIUM CHLORIDE 0.9 % IV SOLN
10.0000 mL/h | Freq: Once | INTRAVENOUS | Status: AC
  Administered 2015-12-04: 10 mL/h via INTRAVENOUS

## 2015-12-04 MED ORDER — FERROUS SULFATE 325 (65 FE) MG PO TABS: 325.0000 mg | ORAL_TABLET | Freq: Three times a day (TID) | ORAL | Status: DC

## 2015-12-04 MED ORDER — SODIUM CHLORIDE 0.9% FLUSH
3.0000 mL | Freq: Two times a day (BID) | INTRAVENOUS | Status: DC
  Administered 2015-12-04: 3 mL via INTRAVENOUS

## 2015-12-04 MED ORDER — DIPHENHYDRAMINE HCL 25 MG PO CAPS
25.0000 mg | ORAL_CAPSULE | Freq: Four times a day (QID) | ORAL | Status: DC | PRN
  Administered 2015-12-04: 25 mg via ORAL
  Filled 2015-12-04: qty 1

## 2015-12-04 MED ORDER — ONDANSETRON HCL 4 MG/2ML IJ SOLN: 4.0000 mg | Freq: Four times a day (QID) | INTRAMUSCULAR | Status: DC | PRN

## 2015-12-04 MED ORDER — SODIUM CHLORIDE 0.9 % IV BOLUS (SEPSIS)
1000.0000 mL | Freq: Once | INTRAVENOUS | Status: AC
  Administered 2015-12-04: 1000 mL via INTRAVENOUS

## 2015-12-04 MED ORDER — ACETAMINOPHEN 325 MG PO TABS
650.0000 mg | ORAL_TABLET | Freq: Four times a day (QID) | ORAL | Status: DC | PRN
  Administered 2015-12-04: 650 mg via ORAL
  Filled 2015-12-04: qty 2

## 2015-12-04 MED ORDER — ONDANSETRON HCL 4 MG PO TABS: 4.0000 mg | ORAL_TABLET | Freq: Four times a day (QID) | ORAL | Status: DC | PRN

## 2015-12-04 NOTE — Discharge Summary (Signed)
Physician Discharge Summary  Sherry Blair [id number] DOB: 12/23/1970 DOA: 12/03/2015  PCP: No PCP Per Patient  Admit date: 12/03/2015 Discharge date: 12/04/2015  Time spent: 45 minutes  Recommendations for Outpatient Follow-up:  1. GI f/u  Discharge Condition: stable    Discharge Diagnoses:  Principal Problem:   Symptomatic anemia   History of present illness:  Sherry Blair is a 45 y.o. woman with a history of iron deficiency anemia and prior transfusion requirement for symptomatic anemia (in 2014; hgb was around six at that time) who was referred from a primary care office for management of symptomatic anemia. She has had headache for one week, but she admits that she has had dyspnea on exertion for at least two months. She has had difficulty with climbing stairs. She has had light-headedness and dizziness, but no syncope/LOC. She denies chest pain, pressure, or tightness. She denies any evidence of GI blood loss. No hematemesis, coffee ground emesis, BRBPR, or melena. No hematuria. No other signs of gross blood loss. Hemoglobin was 9 in July 2016. She took iron supplements for some time but admits that she stopped about six months ago. No h/o heavy menstruation.   Hospital Course:  Anemia due to chronic blood loss - has received 3 U PRBC Hb 4.3>> 8.1 - will give Iron replacement  Hemoccult + - no h/o colon cancer in the family, no weight loss, heart burn, abdominal  - does not want GI f/u in house- would like and outpt appt - have spoken with Dr Oletta Lamas who will arrange outpt f/u   Procedures:  none  Consultations:  D/w GI  Discharge Exam: Filed Weights   12/03/15 2126  Weight: 87.091 kg (192 lb)   Filed Vitals:   12/04/15 1120 12/04/15 1454  BP: 116/55 99/56  Pulse: 72 69  Temp: 98.4 F (36.9 C) 97.9 F (36.6 C)  Resp: 20 20    General: AAO x 3, no distress Cardiovascular: RRR, no murmurs  Respiratory: clear to auscultation  bilaterally GI: soft, non-tender, non-distended, bowel sound positive  Discharge Instructions You were cared for by a hospitalist during your hospital stay. If you have any questions about your discharge medications or the care you received while you were in the hospital after you are discharged, you can call the unit and asked to speak with the hospitalist on call if the hospitalist that took care of you is not available. Once you are discharged, your primary care physician will handle any further medical issues. Please note that NO REFILLS for any discharge medications will be authorized once you are discharged, as it is imperative that you return to your primary care physician (or establish a relationship with a primary care physician if you do not have one) for your aftercare needs so that they can reassess your need for medications and monitor your lab values.     Medication List    STOP taking these medications        fluconazole 100 MG tablet  Commonly known as:  DIFLUCAN     metroNIDAZOLE 500 MG tablet  Commonly known as:  FLAGYL     terconazole 0.4 % vaginal cream  Commonly known as:  TERAZOL 7      TAKE these medications        ferrous sulfate 325 (65 FE) MG tablet  Take 1 tablet (325 mg total) by mouth 3 (three) times daily with meals.       No Known Allergies    The results  of significant diagnostics from this hospitalization (including imaging, microbiology, ancillary and laboratory) are listed below for reference.    Significant Diagnostic Studies: No results found.  Microbiology: No results found for this or any previous visit (from the past 240 hour(s)).   Labs: Basic Metabolic Panel:  Recent Labs Lab 12/03/15 2150  NA 139  K 4.2  CL 109  CO2 24  GLUCOSE 112*  BUN 11  CREATININE 0.64  CALCIUM 8.4*   Liver Function Tests:  Recent Labs Lab 12/03/15 2150  AST 18  ALT 12*  ALKPHOS 51  BILITOT 0.6  PROT 7.3  ALBUMIN 3.9   No results for  input(s): LIPASE, AMYLASE in the last 168 hours. No results for input(s): AMMONIA in the last 168 hours. CBC:  Recent Labs Lab 12/03/15 2150 12/04/15 1254  WBC 6.7 6.4  NEUTROABS 3.3  --   HGB 4.3* 8.1*  HCT 17.7* 27.5*  MCV 50.0* 60.2*  PLT 433* 362   Cardiac Enzymes: No results for input(s): CKTOTAL, CKMB, CKMBINDEX, TROPONINI in the last 168 hours. BNP: BNP (last 3 results) No results for input(s): BNP in the last 8760 hours.  ProBNP (last 3 results) No results for input(s): PROBNP in the last 8760 hours.  CBG: No results for input(s): GLUCAP in the last 168 hours.     SignedDebbe Odea, MD [facility] 12/04/2015, 3:03 PM

## 2015-12-04 NOTE — H&P (Signed)
History and Physical    Sherry Blair [id number] DOB: 08/28/1970 DOA: 12/03/2015  PCP: No PCP Per Patient  Patient coming from: Urgent Care  Chief Complaint: Headache, DOE, fatigue, palpitations  HPI: Sherry Blair is a 45 y.o. woman with a history of iron deficiency anemia and prior transfusion requirement for symptomatic anemia (in 2014; hgb was around six at that time) who was referred from a primary care office for management of symptomatic anemia.  She has had headache for one week, but she admits that she has had dyspnea on exertion for at least two months.  She has had difficulty with climbing stairs.  She has had light-headedness and dizziness, but no syncope/LOC.  She denies chest pain, pressure, or tightness.  She denies any evidence of GI blood loss.  No hematemesis, coffee ground emesis, BRBPR, or melena.  No hematuria.  No other signs of gross blood loss.  Hemoglobin was 9 in July 2016.  She took iron supplements for some time but admits that she stopped about six months ago.  She does not admit to heavy menstrual bleeding.  ED Course:  She has a positive fecal occult blood test.  She is being admitted to telemetry.  Hgb is 4; 3 units of PRBCs have been ordered.  Review of Systems: She reports intermittent ankle edema, worse after standing for prolonged periods and improves with elevation.  Otherwise, 10 point review of systems negative except as stated in the HPI.    Past Medical History  Diagnosis Date  . Blood transfusion without reported diagnosis     Past Surgical History  Procedure Laterality Date  . Gastric bypass    . Gastric bypass       reports that she has never smoked. She has never used smokeless tobacco. She reports that she does not drink alcohol or use illicit drugs.  She is not married but she has a significant other.  She has two children.  No Known Allergies  Family History  Problem Relation Age of Onset  . Hyperlipidemia Mother   . Lupus  Mother   . Hypertension Father   . Diabetes Father   She denies family history of GI malignancy or PUD.  Prior to Admission medications   Medication Sig Start Date End Date Taking? Authorizing Provider  ferrous sulfate 325 (65 FE) MG tablet Take 1 tablet (325 mg total) by mouth 3 (three) times daily with meals. Patient not taking: Reported on 12/03/2015 04/08/13   Caren Griffins, MD  fluconazole (DIFLUCAN) 100 MG tablet Take 1 tablet (100 mg total) by mouth once. Repeat dose in 48-72 hour. Patient not taking: Reported on 12/03/2015 09/27/15   Rachelle A Denney, CNM  metroNIDAZOLE (FLAGYL) 500 MG tablet Take 1 tablet (500 mg total) by mouth 2 (two) times daily. Patient not taking: Reported on 12/03/2015 09/27/15   Rachelle A Denney, CNM  terconazole (TERAZOL 7) 0.4 % vaginal cream Place 1 applicator vaginally at bedtime. Patient not taking: Reported on 12/03/2015 09/27/15   Morene Crocker, CNM    Physical Exam: Filed Vitals:   12/03/15 2059 12/03/15 2126 12/03/15 2314 12/03/15 2316  BP: 112/55  124/66   Pulse: 87  78   Temp: 98.7 F (37.1 C)     TempSrc: Oral     Resp: 18   17  Height:  5\' 3"  (1.6 m)    Weight:  87.091 kg (192 lb)    SpO2: 100%  100%       Constitutional:  NAD, calm, comfortable, patient witnessed ambulating to the ED restroom without difficulty Filed Vitals:   12/03/15 2059 12/03/15 2126 12/03/15 2314 12/03/15 2316  BP: 112/55  124/66   Pulse: 87  78   Temp: 98.7 F (37.1 C)     TempSrc: Oral     Resp: 18   17  Height:  5\' 3"  (1.6 m)    Weight:  87.091 kg (192 lb)    SpO2: 100%  100%    Eyes: PERRL, lids and conjunctivae normal ENMT: Mucous membranes are moist. Normal dentition.  Neck: normal, supple Respiratory: clear to auscultation bilaterally, no wheezing, no crackles. Normal respiratory effort. No accessory muscle use.  Cardiovascular: Regular rate and rhythm, Faint murmur heard.  No rubs / gallops. No extremity edema. 2+ pedal pulses. Abdomen: no  tenderness, Bowel sounds positive.  Musculoskeletal: no clubbing / cyanosis. No joint deformity upper and lower extremities. Good ROM, no contractures. Normal muscle tone.  Skin: no rashes, lesions, ulcers. No induration Neurologic: CN 2-12 grossly intact. Sensation intact, Strength 5/5 symmetric bilaterally. Psychiatric: Normal judgment and insight. Alert and oriented x 3. Normal mood.    Labs on Admission: I have personally reviewed following labs and imaging studies  CBC:  Recent Labs Lab 12/03/15 2150  WBC 6.7  NEUTROABS 3.3  HGB 4.3*  HCT 17.7*  MCV 50.0*  PLT [id number]*   Basic Metabolic Panel:  Recent Labs Lab 12/03/15 2150  NA 139  K 4.2  CL 109  CO2 24  GLUCOSE 112*  BUN 11  CREATININE 0.64  CALCIUM 8.4*   GFR: Estimated Creatinine Clearance: 92.9 mL/min (by C-G formula based on Cr of 0.64). Liver Function Tests:  Recent Labs Lab 12/03/15 2150  AST 18  ALT 12*  ALKPHOS 51  BILITOT 0.6  PROT 7.3  ALBUMIN 3.9   Coagulation Profile:  Recent Labs Lab 12/03/15 2150  INR 1.08    EKG: PENDING.  Assessment/Plan Principal Problem:   Symptomatic anemia    Admit to telemetry Agree with transfusion of at least three units of PRBCs; check orthostatics; repeat CBC in the AM. Anemia panel requested in the ED prior to transfusion of 1st unit of blood.  Will likely need to resume oral iron supplement.  Of note, patient inquired about IV iron infusions; discussion deferred while awaiting further results. She needs GI consult (inpatient vs outpatient) for positive occult blood test; she will need consideration for endoscopy.   DVT prophylaxis: SCDs Code Status: FULL Family Communication: Significant other at bedside in the ED at time of admission Disposition Plan: Home Consults called: NONE Admission status: Observation, telemetry   Eber Jones MD [facility]   If 7PM-7AM, please contact night-coverage [email or web address] Password  [facility]  12/04/2015, 12:42 AM

## 2015-12-07 LAB — TYPE AND SCREEN
ABO/RH(D): B POS
ANTIBODY SCREEN: NEGATIVE
UNIT DIVISION: 0
UNIT DIVISION: 0
Unit division: 0
Unit division: 0
Unit division: 0

## 2016-01-20 ENCOUNTER — Ambulatory Visit (INDEPENDENT_AMBULATORY_CARE_PROVIDER_SITE_OTHER): Payer: 59 | Admitting: Obstetrics

## 2016-01-20 ENCOUNTER — Encounter: Payer: Self-pay | Admitting: Obstetrics

## 2016-01-20 VITALS — BP 132/85 | HR 74 | Temp 99.3°F

## 2016-01-20 DIAGNOSIS — A499 Bacterial infection, unspecified: Secondary | ICD-10-CM | POA: Diagnosis not present

## 2016-01-20 DIAGNOSIS — N760 Acute vaginitis: Secondary | ICD-10-CM

## 2016-01-20 MED ORDER — METRONIDAZOLE 0.75 % VA GEL: 1.0000 | Freq: Two times a day (BID) | VAGINAL | Status: DC

## 2016-01-20 MED ORDER — METRONIDAZOLE 0.75 % VA GEL: 1.0000 | Freq: Two times a day (BID) | VAGINAL | 4 refills | Status: DC

## 2016-01-20 NOTE — Progress Notes (Signed)
Patient ID: Sherry Blair Reason, female   DOB: 1971/05/04, 45 y.o.   MRN: [id number]  Chief Complaint  Patient presents with  . Vaginal Discharge    persistent vaginal discharge since April, used rx and had minor inprovement but discharge has increased tremendously, denies pelvic pain or odor    HPI Sherry Blair is a 45 y.o. female.  C/o persistent malodorous vaginal discharge.  Further questioning reveals that she is continuing to wash deeply in vagina to get rid of odor. HPI  Past Medical History:  Diagnosis Date  . Blood transfusion without reported diagnosis     Past Surgical History:  Procedure Laterality Date  . GASTRIC BYPASS    . GASTRIC BYPASS      Family History  Problem Relation Age of Onset  . Hyperlipidemia Mother   . Lupus Mother   . Hypertension Father   . Diabetes Father     Social History Social History  Substance Use Topics  . Smoking status: Never Smoker  . Smokeless tobacco: Never Used  . Alcohol use No    No Known Allergies  Current Outpatient Prescriptions  Medication Sig Dispense Refill  . ferrous sulfate 325 (65 FE) MG tablet Take 1 tablet (325 mg total) by mouth 3 (three) times daily with meals. 90 tablet 3  . metroNIDAZOLE (METROGEL VAGINAL) 0.75 % vaginal gel Place 1 Applicatorful vaginally 2 (two) times daily. 70 g 4   Current Facility-Administered Medications  Medication Dose Route Frequency Provider Last Rate Last Dose  . metroNIDAZOLE (METROGEL) 0.75 % vaginal gel 1 Applicatorful  1 Applicatorful Vaginal BID [name] Bombard, MD        Review of Systems Review of Systems Constitutional: negative for fatigue and weight loss Respiratory: negative for cough and wheezing Cardiovascular: negative for chest pain, fatigue and palpitations Gastrointestinal: negative for abdominal pain and change in bowel habits Genitourinary:positive for persistent malodorous vaginal discharge Integument/breast: negative for nipple  discharge Musculoskeletal:negative for myalgias Neurological: negative for gait problems and tremors Behavioral/Psych: negative for abusive relationship, depression Endocrine: negative for temperature intolerance     Blood pressure 132/85, pulse 74, temperature 99.3 F (37.4 C), temperature source Oral, last menstrual period 01/01/2016.  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      Plan    Recommended to stop washing deeply in vaginal. MetroGel Vaginal Rx, chronic regimen. F/U in 4 months for wet prep.  No orders of the defined types were placed in this encounter.  Meds ordered this encounter  Medications  . metroNIDAZOLE (METROGEL) 0.75 % vaginal gel 1 Applicatorful  . metroNIDAZOLE (METROGEL VAGINAL) 0.75 % vaginal gel    Sig: Place 1 Applicatorful vaginally 2 (two) times daily.    Dispense:  70 g    Refill:  4

## 2016-01-23 LAB — NUSWAB VG+, CANDIDA 6SP
CANDIDA ALBICANS, NAA: NEGATIVE
CANDIDA GLABRATA, NAA: NEGATIVE
CANDIDA LUSITANIAE, NAA: NEGATIVE
Candida krusei, NAA: NEGATIVE
Candida parapsilosis, NAA: NEGATIVE
Candida tropicalis, NAA: NEGATIVE
Chlamydia trachomatis, NAA: NEGATIVE
Neisseria gonorrhoeae, NAA: NEGATIVE
Trich vag by NAA: NEGATIVE

## 2016-04-10 ENCOUNTER — Other Ambulatory Visit: Payer: Self-pay | Admitting: Obstetrics

## 2016-04-10 DIAGNOSIS — B9689 Other specified bacterial agents as the cause of diseases classified elsewhere: Secondary | ICD-10-CM

## 2016-04-10 DIAGNOSIS — N760 Acute vaginitis: Secondary | ICD-10-CM

## 2016-06-16 ENCOUNTER — Ambulatory Visit (INDEPENDENT_AMBULATORY_CARE_PROVIDER_SITE_OTHER): Payer: 59 | Admitting: Obstetrics

## 2016-06-16 ENCOUNTER — Encounter: Payer: Self-pay | Admitting: Obstetrics

## 2016-06-16 VITALS — BP 115/71 | HR 68 | Temp 98.6°F | Wt 205.7 lb

## 2016-06-16 DIAGNOSIS — N898 Other specified noninflammatory disorders of vagina: Secondary | ICD-10-CM

## 2016-06-16 DIAGNOSIS — R102 Pelvic and perineal pain: Secondary | ICD-10-CM | POA: Diagnosis not present

## 2016-06-16 MED ORDER — IBUPROFEN 800 MG PO TABS: 800.0000 mg | ORAL_TABLET | Freq: Three times a day (TID) | ORAL | 5 refills | Status: DC | PRN

## 2016-06-16 MED ORDER — OXYCODONE HCL 10 MG PO TABS: 10.0000 mg | ORAL_TABLET | Freq: Four times a day (QID) | ORAL | 0 refills | Status: DC | PRN

## 2016-06-16 NOTE — Progress Notes (Signed)
Patient ID: Sherry Blair Reason, female   DOB: 1971/05/04, 45 y.o.   MRN: [id number]  Chief Complaint  Patient presents with  . other    Discharge/ Abdominal pain Fibroids    HPI Sherry Blair is a 45 y.o. female.  Onset of pelvic pain over the past 3 weeks after last period.  Seen at Urgent care and told that she has fibroids.  Has a history of 5 day periods that are heavy for 2 days.  History of chronic anemia probably from heavy periods, that has required blood transfusion recently by her PCP.  She has been Rx iron and vitamins by her PCP.  Most recent Hgb was 8 grams in June 2017.  Reports clear vaginal discharge, without odor or irritation.  Past surgical history significant for removal of ectopic pregnancy and gastric bypass.  Patient states that she would like to conceive 1 more child, realizing her age risk. HPI  Past Medical History:  Diagnosis Date  . Blood transfusion without reported diagnosis     Past Surgical History:  Procedure Laterality Date  . GASTRIC BYPASS    . GASTRIC BYPASS    , that has reqiured   Family History  Problem Relation Age of Onset  . Hyperlipidemia Mother   . Lupus Mother   . Hypertension Father   . Diabetes Father     Social History Social History  Substance Use Topics  . Smoking status: Never Smoker  . Smokeless tobacco: Never Used  . Alcohol use No    No Known Allergies  Current Outpatient Prescriptions  Medication Sig Dispense Refill  . ferrous sulfate 325 (65 FE) MG tablet Take 1 tablet (325 mg total) by mouth 3 (three) times daily with meals. 90 tablet 3  . ibuprofen (ADVIL,MOTRIN) 800 MG tablet Take 1 tablet (800 mg total) by mouth every 8 (eight) hours as needed. 30 tablet 5  . metroNIDAZOLE (FLAGYL) 500 MG tablet TAKE 1 TABLET (500 MG TOTAL) BY MOUTH 2 (TWO) TIMES DAILY. (Patient not taking: Reported on 06/16/2016) 14 tablet 0  . metroNIDAZOLE (METROGEL VAGINAL) 0.75 % vaginal gel Place 1 Applicatorful vaginally 2 (two) times  daily. (Patient not taking: Reported on 06/16/2016) 70 g 4  . Oxycodone HCl 10 MG TABS Take 1 tablet (10 mg total) by mouth every 6 (six) hours as needed. 30 tablet 0   Current Facility-Administered Medications  Medication Dose Route Frequency Provider Last Rate Last Dose  . metroNIDAZOLE (METROGEL) 0.75 % vaginal gel 1 Applicatorful  1 Applicatorful Vaginal BID [name] Bombard, MD        Review of Systems Review of Systems Constitutional: negative for fatigue and weight loss Respiratory: negative for cough and wheezing Cardiovascular: negative for chest pain, fatigue and palpitations Gastrointestinal: negative for abdominal pain and change in bowel habits Genitourinary:positive for heavy 5 day periods and chronic anemia Integument/breast: negative for nipple discharge Musculoskeletal:negative for myalgias Neurological: negative for gait problems and tremors Behavioral/Psych: negative for abusive relationship, depression Endocrine: negative for temperature intolerance      Blood pressure 115/71, pulse 68, temperature 98.6 F (37 C), temperature source Oral, weight 205 lb 11.2 oz (93.3 kg).  Physical Exam Physical Exam:  Deferred because of heavy menstruation  > 50% of 15 min visit spent on counseling and coordination of care.    Data Reviewed CBC  Assessment     Pelvic pain AUB - possible anatomic etiology    Plan    Ibuprofen / Oxycodone Rx Ultrasound ordered Follow up  in 2 weeks  Orders Placed This Encounter  Procedures  . US Pelvis Complete    Standing Status:   Future    Standing Expiration Date:   08/17/2017    Order Specific Question:   Reason for Exam (SYMPTOM  OR DIAGNOSIS REQUIRED)    Answer:   pelvic pain    Order Specific Question:   Preferred imaging location?    Answer:   [facility]  . US Transvaginal Non-OB    Standing Status:   Future    Standing Expiration Date:   08/17/2017    Order Specific Question:   Reason for Exam (SYMPTOM  OR  DIAGNOSIS REQUIRED)    Answer:   Pelvic pain    Order Specific Question:   Preferred imaging location?    Answer:   [facility]   Meds ordered this encounter  Medications  . Oxycodone HCl 10 MG TABS    Sig: Take 1 tablet (10 mg total) by mouth every 6 (six) hours as needed.    Dispense:  30 tablet    Refill:  0  . ibuprofen (ADVIL,MOTRIN) 800 MG tablet    Sig: Take 1 tablet (800 mg total) by mouth every 8 (eight) hours as needed.    Dispense:  30 tablet    Refill:  5

## 2016-07-02 ENCOUNTER — Other Ambulatory Visit: Payer: 59

## 2016-09-02 ENCOUNTER — Ambulatory Visit (HOSPITAL_COMMUNITY)
Admission: RE | Admit: 2016-09-02 | Discharge: 2016-09-02 | Disposition: A | Discharge status: Home / Self Care | Payer: BLUE CROSS/BLUE SHIELD | Source: Ambulatory Visit | Attending: Obstetrics | Admitting: Obstetrics

## 2016-09-02 DIAGNOSIS — D259 Leiomyoma of uterus, unspecified: Secondary | ICD-10-CM | POA: Diagnosis not present

## 2016-09-02 DIAGNOSIS — R102 Pelvic and perineal pain: Secondary | ICD-10-CM | POA: Insufficient documentation

## 2016-09-02 IMAGING — US US TRANSVAGINAL NON-OB
1 series · 15 of 25 positions shown · non-contrast
Comparison: [DATE] obstetric scan.

CLINICAL DATA: 45-year-old female with recent episode of 3 weeks of
pelvic pain. History of right salpingectomy for ectopic pregnancy.
LMP [DATE].

EXAM:
TRANSABDOMINAL AND TRANSVAGINAL ULTRASOUND OF PELVIS
TECHNIQUE: Both transabdominal and transvaginal ultrasound examinations of the
pelvis were performed. Transabdominal technique was performed for
global imaging of the pelvis including uterus, ovaries, adnexal
regions, and pelvic cul-de-sac. It was necessary to proceed with
endovaginal exam following the transabdominal exam to visualize the
endometrium, myometrium and adnexa.

[Series 1: us transvaginal non-ob · 105 acquisitions, 15 frames shown]
[im 1/105]
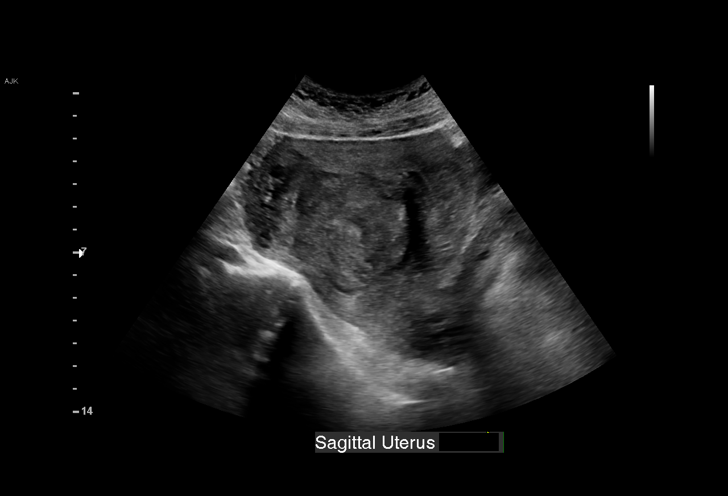
[im 9/105]
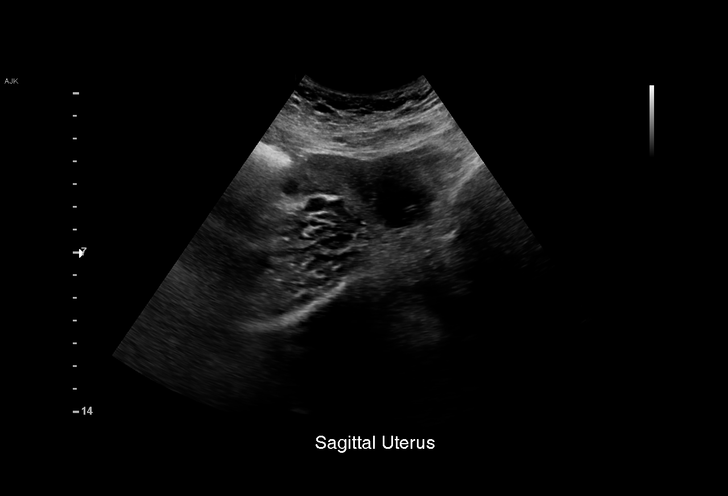
[im 18/105]
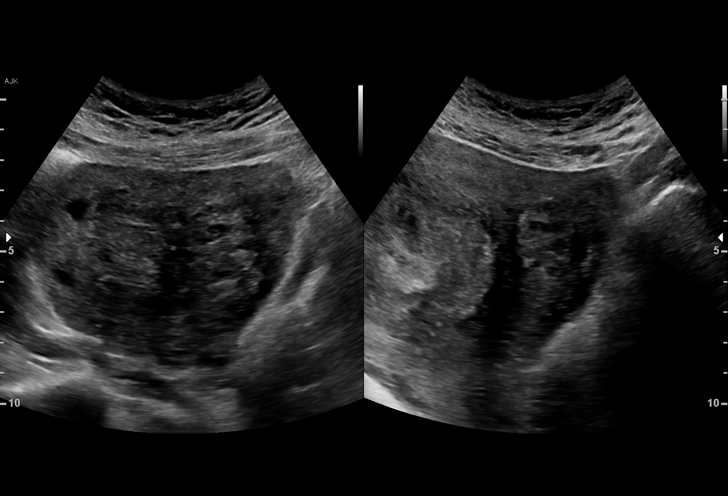
[im 22/105]
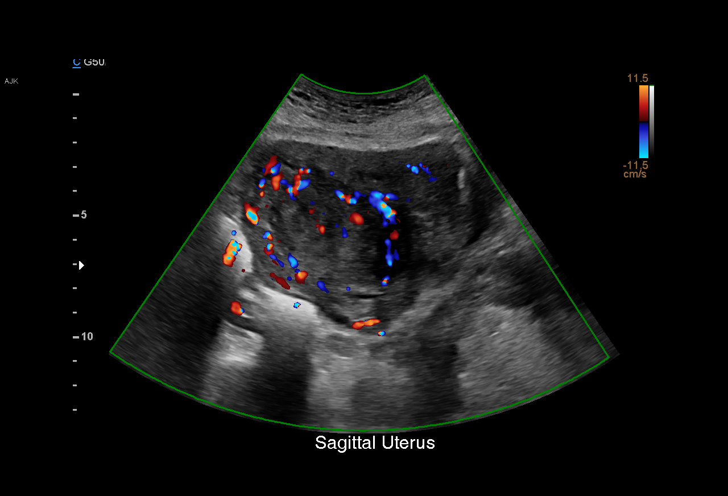
[im 31/105]
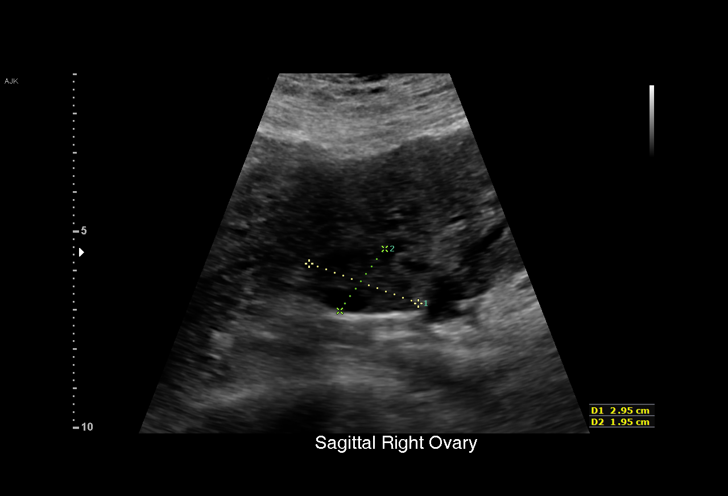
[im 40/105]
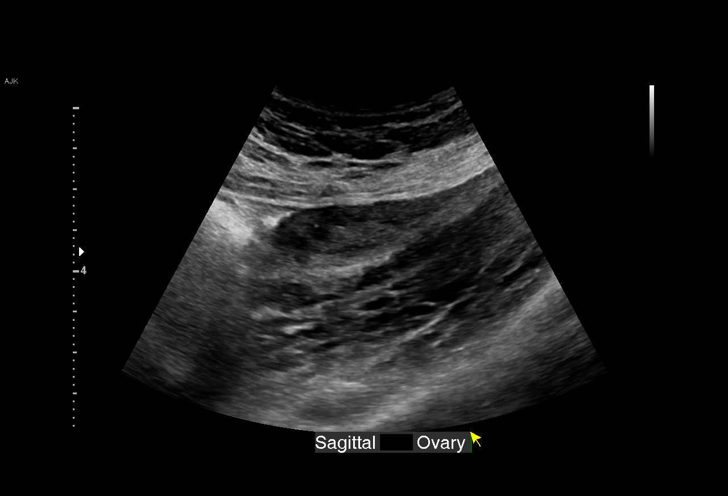
[im 44/105]
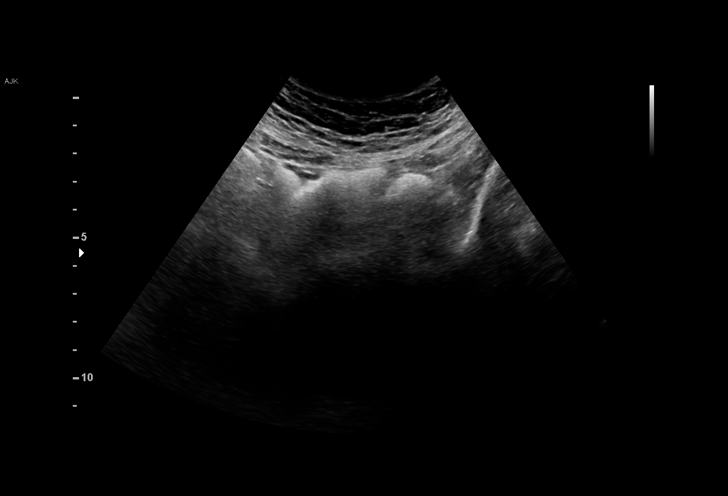
[im 53/105]
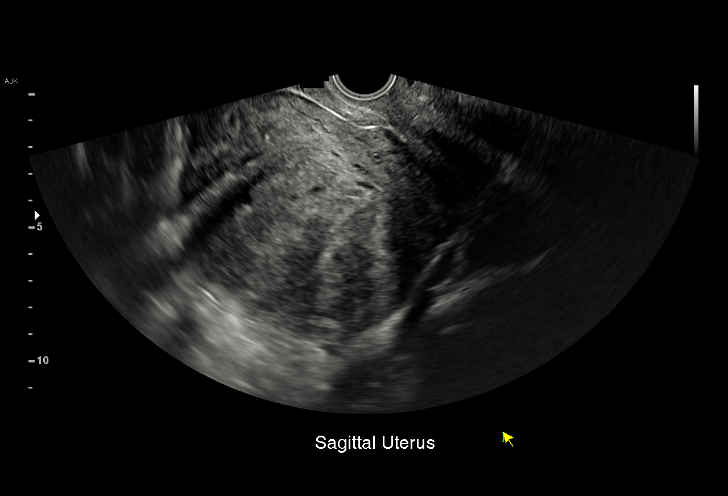
[im 61/105]
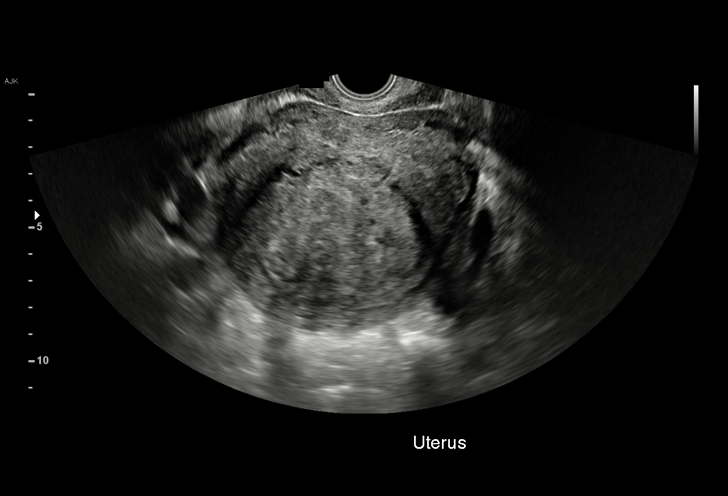
[im 66/105]
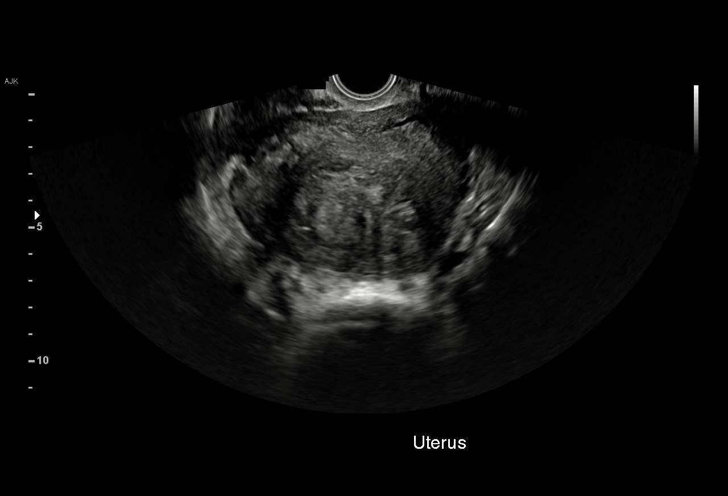
[im 74/105]
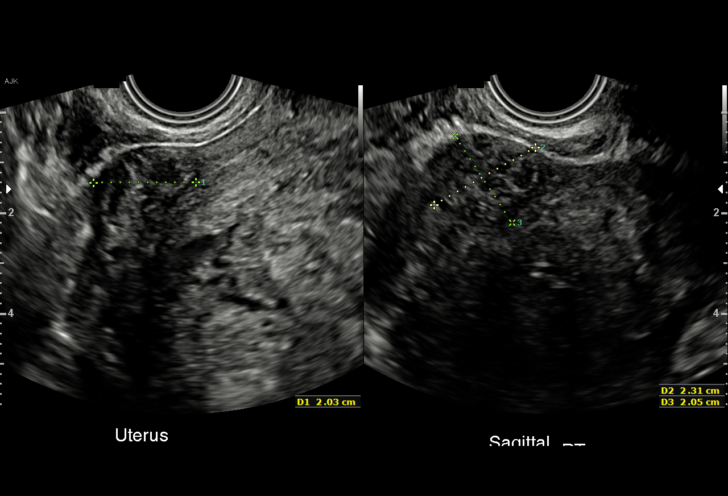
[im 83/105]
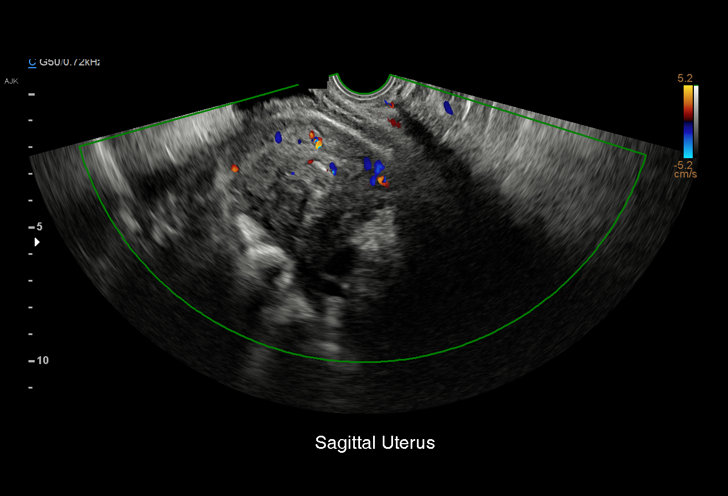
[im 87/105]
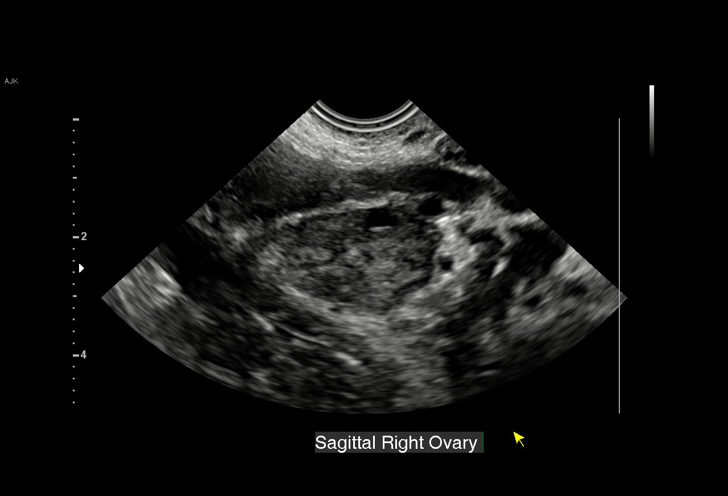
[im 96/105]
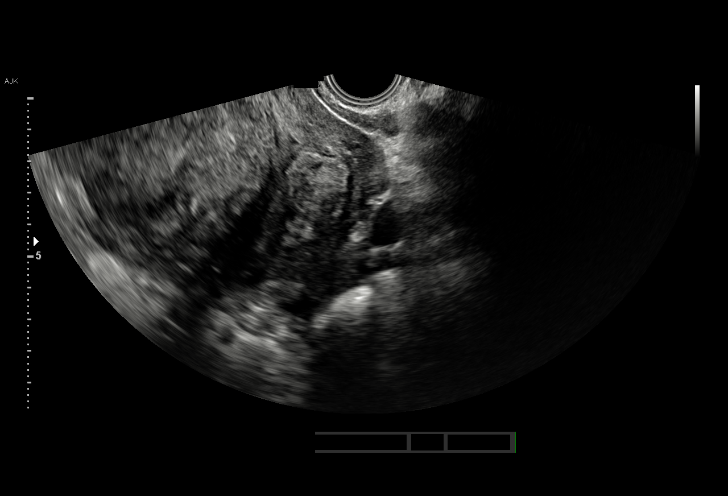
[im 105/105]
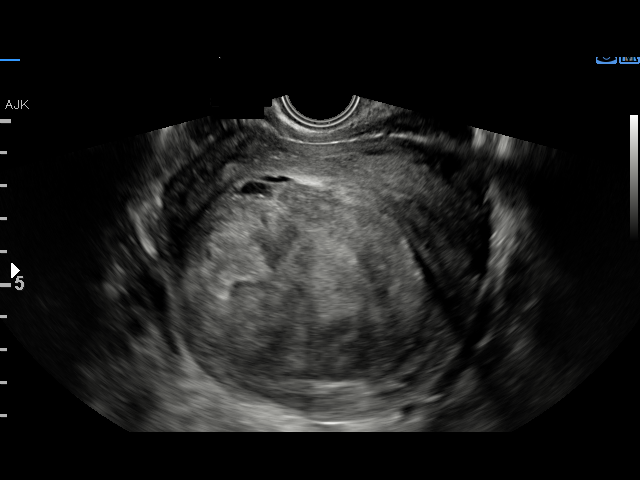

[15 of 25 positions shown; findings below may reference images not displayed]

FINDINGS: Uterus

Measurements: 12.6 x 10.1 x 10.5 cm. Enlarged anteverted myomatous
uterus, with fibroids as follows:

- right posterior uterine body intramural 7.2 x 6.1 x 7.8 cm fibroid
with probable 30% submucosal component

- left uterine body intramural 3.4 x 3.4 x 2.9 cm fibroid

- right anterior uterine body subserosal 2.3 x 2.1 x 2.0 cm fibroid

Endometrium

Nondiagnostic evaluation of the endometrium due to significant
mass-effect/distortion by the surrounding fibroids.

Right ovary

Measurements: 3.5 x 1.7 x 1.8 cm. Normal appearance/no adnexal mass.

Left ovary

Measurements: 3.1 x 2.6 x 3.5 cm. Normal appearance/no adnexal mass.

Other findings

No abnormal free fluid.
IMPRESSION: 1. Enlarged myomatous uterus as detailed.
2. Nondiagnostic evaluation of the endometrium due to significant
mass effect/distortion by the surrounding fibroids.
3. Normal ovaries.  No adnexal masses.

## 2016-09-04 ENCOUNTER — Encounter: Payer: Self-pay | Admitting: Obstetrics

## 2016-09-04 ENCOUNTER — Ambulatory Visit (INDEPENDENT_AMBULATORY_CARE_PROVIDER_SITE_OTHER): Payer: 59 | Admitting: Obstetrics

## 2016-09-04 VITALS — BP 113/74 | HR 66 | Temp 97.5°F | Wt 207.4 lb

## 2016-09-04 DIAGNOSIS — Z113 Encounter for screening for infections with a predominantly sexual mode of transmission: Secondary | ICD-10-CM | POA: Diagnosis not present

## 2016-09-04 DIAGNOSIS — D250 Submucous leiomyoma of uterus: Secondary | ICD-10-CM | POA: Diagnosis not present

## 2016-09-04 DIAGNOSIS — R102 Pelvic and perineal pain: Secondary | ICD-10-CM | POA: Diagnosis not present

## 2016-09-04 DIAGNOSIS — N898 Other specified noninflammatory disorders of vagina: Secondary | ICD-10-CM | POA: Diagnosis not present

## 2016-09-04 DIAGNOSIS — N939 Abnormal uterine and vaginal bleeding, unspecified: Secondary | ICD-10-CM

## 2016-09-04 LAB — POCT URINALYSIS DIPSTICK
Bilirubin, UA: NORMAL
Blood, UA: NEGATIVE
Glucose, UA: NEGATIVE
KETONES UA: NEGATIVE
Nitrite, UA: NEGATIVE
PH UA: 5 (ref 5.0–8.0)
PROTEIN UA: NEGATIVE
SPEC GRAV UA: 1.015 (ref 1.030–1.035)
UROBILINOGEN UA: NEGATIVE (ref ?–2.0)

## 2016-09-04 NOTE — Addendum Note (Signed)
Addended by: Tristan Schroeder D on: 09/04/2016 11:44 AM   Modules accepted: Orders

## 2016-09-04 NOTE — Addendum Note (Signed)
Addended by: Tristan Schroeder D on: 09/04/2016 10:57 AM   Modules accepted: Orders

## 2016-09-04 NOTE — Progress Notes (Addendum)
Patient ID: Sherry Blair Reason, female   DOB: Apr 06, 1971, 46 y.o.   MRN: [id number]  Chief Complaint  Patient presents with  . GYN    Patient is in the office for GYN visit.    HPI Sherry Blair is a 46 y.o. female.  History of uterine fibroids with 5 day heavy periods which has required transfusion in the past for anemia.  Had episode of pelvic pain a few weeks ago.  Sent for ultrasound for follow up of fibroids growth and locations.  Presents for ultrasound results.  Has not had a recurrence of pain.  She has had increased vaginal discharge that is clear, without odor but does cause irritaion and she has to wear a panty liner. HPI  Past Medical History:  Diagnosis Date  . Blood transfusion without reported diagnosis     Past Surgical History:  Procedure Laterality Date  . GASTRIC BYPASS    . GASTRIC BYPASS      Family History  Problem Relation Age of Onset  . Hyperlipidemia Mother   . Lupus Mother   . Hypertension Father   . Diabetes Father     Social History Social History  Substance Use Topics  . Smoking status: Never Smoker  . Smokeless tobacco: Never Used  . Alcohol use No    No Known Allergies  Current Outpatient Prescriptions  Medication Sig Dispense Refill  . Biotin 5000 MCG CAPS Take by mouth.    . cholecalciferol (VITAMIN D) 1000 units tablet Take 1,000 Units by mouth daily.    . ferrous sulfate 325 (65 FE) MG tablet Take 1 tablet (325 mg total) by mouth 3 (three) times daily with meals. 90 tablet 3  . ibuprofen (ADVIL,MOTRIN) 800 MG tablet Take 1 tablet (800 mg total) by mouth every 8 (eight) hours as needed. (Patient not taking: Reported on 09/04/2016) 30 tablet 5  . metroNIDAZOLE (FLAGYL) 500 MG tablet TAKE 1 TABLET (500 MG TOTAL) BY MOUTH 2 (TWO) TIMES DAILY. (Patient not taking: Reported on 06/16/2016) 14 tablet 0  . metroNIDAZOLE (METROGEL VAGINAL) 0.75 % vaginal gel Place 1 Applicatorful vaginally 2 (two) times daily. (Patient not taking: Reported on  06/16/2016) 70 g 4  . Oxycodone HCl 10 MG TABS Take 1 tablet (10 mg total) by mouth every 6 (six) hours as needed. (Patient not taking: Reported on 09/04/2016) 30 tablet 0   Current Facility-Administered Medications  Medication Dose Route Frequency Provider Last Rate Last Dose  . metroNIDAZOLE (METROGEL) 0.75 % vaginal gel 1 Applicatorful  1 Applicatorful Vaginal BID [name] Bombard, MD        Review of Systems Review of Systems Constitutional: negative for fatigue and weight loss Respiratory: negative for cough and wheezing Cardiovascular: negative for chest pain, fatigue and palpitations Gastrointestinal: negative for abdominal pain and change in bowel habits Genitourinary:positive for heavy periods, vaginal discharge Integument/breast: negative for nipple discharge Musculoskeletal:negative for myalgias Neurological: negative for gait problems and tremors Behavioral/Psych: negative for abusive relationship, depression Endocrine: negative for temperature intolerance      Blood pressure 113/74, pulse 66, temperature 97.5 F (36.4 C), temperature source Oral, weight 207 lb 6.4 oz (94.1 kg), last menstrual period 08/29/2016.  Physical Exam Physical Exam General:   alert  Skin:   no rash or abnormalities  Lungs:   clear to auscultation bilaterally  Heart:   regular rate and rhythm, S1, S2 normal, no murmur, click, rub or gallop  Breasts:   normal without suspicious masses, skin or nipple changes or axillary  nodes  Abdomen:  normal findings: no organomegaly, soft, non-tender and no hernia  Pelvis:  External genitalia: normal general appearance Urinary system: urethral meatus normal and bladder without fullness, nontender Vaginal: normal without tenderness, induration or masses.  Thin, grey vaginal discharge Cervix: normal appearance Adnexa: normal bimanual exam Uterus: anteverted and non-tender, enlarged ~ 13 weeks, irregular contour    50% of 20 min visit spent on counseling and  coordination of care.    Data Reviewed Wet Prep Ultrasound  Assessment     Symptomatic Uterine Fibroids.  Ultrasound reveals a 7cm dominant intramural fibroid with a 30% submucous component pushing into the uterine cavity.  There were other small intramural fibroids. Vaginal discharge.  Wet prep and cultures done.    Plan    Patient to consider options recommended of Hysterectomy vs Myomectomy.  Pluses and minuses of both procedures discussed.  Educational material dispensed. F/U in 3 months for Annual and pap.   No orders of the defined types were placed in this encounter.  Meds ordered this encounter  Medications  . cholecalciferol (VITAMIN D) 1000 units tablet    Sig: Take 1,000 Units by mouth daily.  . Biotin 5000 MCG CAPS    Sig: Take by mouth.

## 2016-09-04 NOTE — Patient Instructions (Addendum)
Myomectomy Myomectomy is surgery to remove a noncancerous tumor (myoma) from the uterus. Myomas are tumors made up of fibrous tissue. They are often called fibroid tumors. Fibroid tumors can range from the size of a pea to the size of a grapefruit. In a myomectomy, the fibroid tumor is removed without removing the uterus. Because these tumors are rarely cancerous, this surgery is usually done only if the tumor is growing or causing symptoms such as pain, pressure, bleeding, or pain with intercourse. LET YOUR HEALTH CARE PROVIDER KNOW ABOUT:  Any allergies you have.  All medicines you are taking, including vitamins, herbs, eye drops, creams, and over-the-counter medicines.  Previous problems you or members of your family have had with the use of anesthetics.  Any blood disorders you have.  Previous surgeries you have had.  Medical conditions you have. RISKS AND COMPLICATIONS Generally, this is a safe procedure. However, as with any procedure, complications can occur. Possible complications include:  Excessive bleeding.  Infection.  Injury to nearby organs.  Blood clots in the legs, chest, or brain.  Scar tissue on other organs and in the pelvis. This may require another surgery to remove the scar tissue.  BEFORE THE PROCEDURE  Ask your health care provider about changing or stopping your regular medicines. Avoid taking aspirin or blood thinners as directed by your health care provider.  Do not  eat or drink anything after midnight on the night before surgery.  If you smoke, do not  smoke for 2 weeks before the surgery.  Do not  drink alcohol the day before the surgery.  Arrange for someone to drive you home after the procedure or after your hospital stay. Also arrange for someone to help you with activities during your recovery. PROCEDURE You will be given medicine to make you sleep through the procedure (general anesthetic). Any of the following methods may be used to perform  a myomectomy:  Small monitors will be put on your body. They are used to check your heart, blood pressure, and oxygen level.  An IV access tube will be put into one of your veins. Medicine will be able to flow directly into your body through this IV tube.  You might be given a medicine to help you relax (sedative).  You will be given a medicine to make you sleep (general anesthetic). A breathing tube will be placed into your lungs during the procedure.  A thin, flexible tube (catheter) will be inserted into your bladder to collect urine.  Any of the following methods may be used to perform a myomectomy: ? Hysteroscopic myomectomy-This method may be used when the fibroid tumor is inside the cavity of the uterus. A long, thin tube that is like a telescope (hysteroscope) is inserted inside the uterus. A saline solution is put into your uterus. This expands the uterus and allows the surgeon to see the fibroids. Tools are passed through the hysteroscope to remove the fibroid tumor in pieces. ? Laparoscopic myomectomy-A few small cuts (incisions) are made in the lower abdomen. A thin, lighted tube with a tiny camera on the end (laparoscope) is inserted through one of the incisions. This gives the surgeon a good view of the area. The fibroid tumor is removed through the other incisions. The incisions are then closed with stitches (sutures) or staples. ? Abdominal myomectomy-This method is used when the fibroid tumor cannot be removed with a hysteroscope or laparoscope. The surgery is performed through a larger surgical incision in the abdomen. The   fibroid tumor is removed through this incision. The incision is closed with sutures or staples.  What to expect after the procedure  If you had a laparoscopic or hysteroscopic myomectomy, you may be able to go home the same day, or you may need to stay in the hospital overnight.  If you had an abdominal myomectomy, you may need to stay in the hospital for a  few days.  Your IV access tube and catheter will be removed in 1-2 days.  You may be given medicine for pain or to help you sleep.  You may be given an antibiotic medicine, if needed. This information is not intended to replace advice given to you by your health care provider. Make sure you discuss any questions you have with your health care provider. Document Released: 04/05/2007 Document Revised: 11/14/2015 Document Reviewed: 01/18/2013 Elsevier Interactive Patient Education  2017 [employer] Myomectomy, Care After Refer to this sheet in the next few weeks. These instructions provide you with information on caring for yourself after your procedure. Your health care provider may also give you more specific instructions. Your treatment has been planned according to current medical practices, but problems sometimes occur. Call your health care provider if you have any problems or questions after your procedure. What can I expect after the procedure? After your procedure, it is typical to have the following:  Pain in your abdomen, especially at any incision sites. You will be given pain medicine to control the pain.  Tiredness. This is a normal part of the recovery process. Your energy level will return to normal over the next several weeks.  Constipation.  Vaginal bleeding. This is normal and should stop after 1-2 weeks.  Follow these instructions at home:  Only take over-the-counter or prescription medicines as directed by your health care provider. Avoid aspirin because it can cause bleeding.  Do not douche, use tampons, or have sexual intercourse until given permission by your health care provider.  Remove or change any bandages (dressings) as directed by your health care provider.  Take showers instead of baths as directed by your health care provider.  You will probably be able to go back to your normal routine after a few days. Do not do anything that requires extra effort  until your health care provider says it is okay. Do not lift anything heavier than 15 pounds (6.8 kg) until your health care provider approves.  Walk daily but take frequent rest breaks if you tire easily.  Continue to practice deep breathing and coughing. If it hurts to cough, try holding a pillow against your belly as you cough.  If you become constipated, you may: ? Use a mild laxative if your health care provider approves. ? Add more fruit and bran to your diet. ? Drink enough fluids to keep your urine clear or pale yellow.  Take your temperature twice a day and write it down.  Do not drink alcohol.  Do not drive until your health care provider approves.  Have someone help you at home for 1 week or until you can do your own household activities.  Follow up with your health care provider as directed. Contact a health care provider if:  You have a fever.  You have increasing abdominal pain that is not relieved with medicine.  You have nausea, vomiting, or diarrhea.  You have pain when you urinate, or you have blood in your urine.  You have a rash on your body.  You have pain   or redness where your IV access tube was inserted.  You have redness, swelling, or any kind of drainage from an incision. Get help right away if:  You have weakness or lightheadedness.  You have pain, swelling, or redness in your legs.  You have chest pain.  You faint.  You have shortness of breath.  You have heavy vaginal bleeding.  Your incision is opening up. This information is not intended to replace advice given to you by your health care provider. Make sure you discuss any questions you have with your health care provider. Document Released: 10/29/2010 Document Revised: 11/14/2015 Document Reviewed: 01/18/2013 Elsevier Interactive Patient Education  2017 [employer] Inc[employer]  

## 2016-09-04 NOTE — Progress Notes (Signed)
Patient is in the office to discuss u/s results and states that she is also having some vaginal irritation.

## 2016-09-07 LAB — CERVICOVAGINAL ANCILLARY ONLY
Bacterial vaginitis: NEGATIVE
Candida vaginitis: NEGATIVE
Chlamydia: NEGATIVE
Neisseria Gonorrhea: NEGATIVE
Trichomonas: NEGATIVE

## 2016-12-28 DIAGNOSIS — D251 Intramural leiomyoma of uterus: Secondary | ICD-10-CM | POA: Diagnosis not present

## 2016-12-28 DIAGNOSIS — D252 Subserosal leiomyoma of uterus: Secondary | ICD-10-CM | POA: Diagnosis not present

## 2016-12-28 DIAGNOSIS — Z6834 Body mass index (BMI) 34.0-34.9, adult: Secondary | ICD-10-CM | POA: Diagnosis not present

## 2016-12-28 DIAGNOSIS — N946 Dysmenorrhea, unspecified: Secondary | ICD-10-CM | POA: Diagnosis not present

## 2017-01-07 ENCOUNTER — Ambulatory Visit: Payer: 59 | Admitting: Obstetrics

## 2017-01-12 ENCOUNTER — Ambulatory Visit: Payer: 59 | Admitting: Obstetrics

## 2017-01-19 ENCOUNTER — Ambulatory Visit: Payer: BLUE CROSS/BLUE SHIELD | Admitting: Obstetrics and Gynecology

## 2017-01-19 DIAGNOSIS — Z319 Encounter for procreative management, unspecified: Secondary | ICD-10-CM

## 2017-01-19 NOTE — Progress Notes (Signed)
Pt here today for discussion of surgery for fibroids so that she can become pregnant.  Pt informed me that she is to be referred to [facility] for infertility.  I explained to the pt that when she meets with the provider for consultation the provider at [facility] will determine if surgery is needed and anything else that will be necessary to help with her getting pregnant.  Pt stated understanding and that she will cancel this appointment and schedule an appointment with [facility] for consultation.

## 2017-01-20 DIAGNOSIS — R350 Frequency of micturition: Secondary | ICD-10-CM | POA: Diagnosis not present

## 2017-01-20 DIAGNOSIS — Z202 Contact with and (suspected) exposure to infections with a predominantly sexual mode of transmission: Secondary | ICD-10-CM | POA: Diagnosis not present

## 2017-01-20 DIAGNOSIS — N760 Acute vaginitis: Secondary | ICD-10-CM | POA: Diagnosis not present

## 2017-02-23 ENCOUNTER — Telehealth: Payer: Self-pay

## 2017-02-23 NOTE — Telephone Encounter (Signed)
Pt left a message requesting refill for Cipro and Flagyl. She states that she is continuing to have urinary frequency, malodorous urine, cloudy urine. She also complains of having a white mucous discharge with no odor. Pt states that she was just recently treated for UTI.

## 2017-02-25 ENCOUNTER — Other Ambulatory Visit: Payer: Self-pay | Admitting: Obstetrics

## 2017-02-25 DIAGNOSIS — N760 Acute vaginitis: Secondary | ICD-10-CM

## 2017-02-25 DIAGNOSIS — B9689 Other specified bacterial agents as the cause of diseases classified elsewhere: Secondary | ICD-10-CM

## 2017-02-25 DIAGNOSIS — N3000 Acute cystitis without hematuria: Secondary | ICD-10-CM

## 2017-02-25 MED ORDER — METRONIDAZOLE 500 MG PO TABS: 500.0000 mg | ORAL_TABLET | Freq: Two times a day (BID) | ORAL | 0 refills | Status: DC

## 2017-02-25 MED ORDER — CEFUROXIME AXETIL 500 MG PO TABS: 500.0000 mg | ORAL_TABLET | Freq: Two times a day (BID) | ORAL | 2 refills | Status: DC

## 2017-02-25 NOTE — Progress Notes (Signed)
Left message for patient to return call.

## 2017-02-25 NOTE — Progress Notes (Signed)
Pt informed

## 2017-03-16 ENCOUNTER — Other Ambulatory Visit: Payer: Self-pay | Admitting: Obstetrics

## 2017-03-16 DIAGNOSIS — N760 Acute vaginitis: Secondary | ICD-10-CM

## 2017-03-16 DIAGNOSIS — B9689 Other specified bacterial agents as the cause of diseases classified elsewhere: Secondary | ICD-10-CM

## 2017-07-08 ENCOUNTER — Ambulatory Visit (INDEPENDENT_AMBULATORY_CARE_PROVIDER_SITE_OTHER): Payer: BLUE CROSS/BLUE SHIELD | Admitting: Obstetrics

## 2017-07-08 ENCOUNTER — Encounter: Payer: Self-pay | Admitting: Obstetrics

## 2017-07-08 VITALS — BP 110/73 | HR 72 | Wt 205.2 lb

## 2017-07-08 DIAGNOSIS — E669 Obesity, unspecified: Secondary | ICD-10-CM

## 2017-07-08 DIAGNOSIS — Z01419 Encounter for gynecological examination (general) (routine) without abnormal findings: Secondary | ICD-10-CM | POA: Diagnosis not present

## 2017-07-08 DIAGNOSIS — D250 Submucous leiomyoma of uterus: Secondary | ICD-10-CM

## 2017-07-08 DIAGNOSIS — Z124 Encounter for screening for malignant neoplasm of cervix: Secondary | ICD-10-CM

## 2017-07-08 DIAGNOSIS — N939 Abnormal uterine and vaginal bleeding, unspecified: Secondary | ICD-10-CM | POA: Diagnosis not present

## 2017-07-08 DIAGNOSIS — Z113 Encounter for screening for infections with a predominantly sexual mode of transmission: Secondary | ICD-10-CM

## 2017-07-08 DIAGNOSIS — Z0000 Encounter for general adult medical examination without abnormal findings: Secondary | ICD-10-CM

## 2017-07-08 DIAGNOSIS — N3000 Acute cystitis without hematuria: Secondary | ICD-10-CM

## 2017-07-08 DIAGNOSIS — Z1151 Encounter for screening for human papillomavirus (HPV): Secondary | ICD-10-CM

## 2017-07-08 DIAGNOSIS — D500 Iron deficiency anemia secondary to blood loss (chronic): Secondary | ICD-10-CM

## 2017-07-08 DIAGNOSIS — N944 Primary dysmenorrhea: Secondary | ICD-10-CM

## 2017-07-08 DIAGNOSIS — R87612 Low grade squamous intraepithelial lesion on cytologic smear of cervix (LGSIL): Secondary | ICD-10-CM | POA: Diagnosis not present

## 2017-07-08 LAB — POCT URINALYSIS DIPSTICK
BILIRUBIN UA: NEGATIVE
Blood, UA: NEGATIVE
Glucose, UA: NEGATIVE
KETONES UA: NEGATIVE
NITRITE UA: POSITIVE
PROTEIN UA: NEGATIVE
SPEC GRAV UA: 1.01 (ref 1.010–1.025)
UROBILINOGEN UA: NEGATIVE U/dL — AB
pH, UA: 7 (ref 5.0–8.0)

## 2017-07-08 MED ORDER — IBUPROFEN 800 MG PO TABS: ORAL_TABLET | ORAL | 11 refills | Status: DC

## 2017-07-08 NOTE — Addendum Note (Signed)
Addended by: Maryruth Eve on: 07/08/2017 05:06 PM   Modules accepted: Orders

## 2017-07-08 NOTE — Progress Notes (Signed)
Subjective:        Sherry Blair is a 47 y.o. female here for a routine exam.  Current complaints: Heavy and painful periods.  Burning with urination.  Has a history of fibroids, submucous and intramural.  Personal health questionnaire:  Is patient [name], have a family history of breast and/or ovarian cancer: no Is there a family history of uterine cancer diagnosed at age < 55, gastrointestinal cancer, urinary tract cancer, family member who is a Field seismologist syndrome-associated carrier: no Is the patient overweight and hypertensive, family history of diabetes, personal history of gestational diabetes, preeclampsia or PCOS: no Is patient over 75, have PCOS,  family history of premature CHD under age 48, diabetes, smoke, have hypertension or peripheral artery disease:  no At any time, has a partner hit, kicked or otherwise hurt or frightened you?: no Over the past 2 weeks, have you felt down, depressed or hopeless?: no Over the past 2 weeks, have you felt little interest or pleasure in doing things?:no   Gynecologic History Patient's last menstrual period was 06/23/2017. Contraception: none Last Pap: 2016. Results were: normal Last mammogram: 2017. Results were: normal  Obstetric History OB History  Gravida Para Term Preterm AB Living  4 2 2   2 2   SAB TAB Ectopic Multiple Live Births    1 1   2     # Outcome Date GA Lbr Len/2nd Weight Sex Delivery Anes PTL Lv  4 Ectopic 2011 [redacted]w[redacted]d       DEC  3 Term 09/19/97 [redacted]w[redacted]d  7 lb 8 oz (3.402 kg) M Vag-Spont EPI  LIV  2 Term 08/24/96 [redacted]w[redacted]d  7 lb 6 oz (3.345 kg) F Vag-Spont EPI  LIV  1 TAB         DEC      Past Medical History:  Diagnosis Date  . Blood transfusion without reported diagnosis     Past Surgical History:  Procedure Laterality Date  . GASTRIC BYPASS    . GASTRIC BYPASS       Current Outpatient Medications:  .  Biotin 5000 MCG CAPS, Take by mouth., Disp: , Rfl:  .  cholecalciferol (VITAMIN D) 1000 units  tablet, Take 1,000 Units by mouth daily., Disp: , Rfl:  .  ferrous sulfate 325 (65 FE) MG tablet, Take 1 tablet (325 mg total) by mouth 3 (three) times daily with meals., Disp: 90 tablet, Rfl: 3 .  ibuprofen (ADVIL,MOTRIN) 800 MG tablet, Take 1 tablet (800 mg total) by mouth every 8 (eight) hours as needed., Disp: 30 tablet, Rfl: 5 .  ibuprofen (ADVIL,MOTRIN) 800 MG tablet, Take 1 po q 8 hrs starting before period until period ends, monthly., Disp: 30 tablet, Rfl: 11  Current Facility-Administered Medications:  .  metroNIDAZOLE (METROGEL) 0.75 % vaginal gel 1 Applicatorful, 1 Applicatorful, Vaginal, BID, [name] Bombard, MD No Known Allergies  Social History   Tobacco Use  . Smoking status: Never Smoker  . Smokeless tobacco: Never Used  Substance Use Topics  . Alcohol use: No    Alcohol/week: 0.0 oz    Family History  Problem Relation Age of Onset  . Hyperlipidemia Mother   . Lupus Mother   . Hypertension Father   . Diabetes Father       Review of Systems  Constitutional: negative for fatigue and weight loss Respiratory: negative for cough and wheezing Cardiovascular: negative for chest pain, fatigue and palpitations Gastrointestinal: negative for abdominal pain and change in bowel habits Musculoskeletal:negative for myalgias  Neurological: negative for gait problems and tremors Behavioral/Psych: negative for abusive relationship, depression Endocrine: negative for temperature intolerance    Genitourinary:positive for abnormal menstrual periods, and negative for genital lesions, hot flashes, sexual problems and vaginal discharge.  Positive for burning with urination Integument/breast: negative for breast lump, breast tenderness, nipple discharge and skin lesion(s)    Objective:       BP 110/73   Pulse 72   Wt 205 lb 3.2 oz (93.1 kg)   LMP 06/23/2017   BMI 36.35 kg/m  General:   alert  Skin:   no rash or abnormalities  Lungs:   clear to auscultation bilaterally   Heart:   regular rate and rhythm, S1, S2 normal, no murmur, click, rub or gallop  Breasts:   normal without suspicious masses, skin or nipple changes or axillary nodes  Abdomen:  normal findings: no organomegaly, soft, non-tender and no hernia  Pelvis:  External genitalia: normal general appearance Urinary system: urethral meatus normal and bladder without fullness, nontender Vaginal: normal without tenderness, induration or masses Cervix: normal appearance Adnexa: normal bimanual exam Uterus: anteverted and non-tender, normal size   Lab Review Urine pregnancy test Labs reviewed yes Radiologic studies reviewed yes  50% of 20 min visit spent on counseling and coordination of care.   Assessment and Plan:     1. Encounter for routine gynecological examination with Papanicolaou smear of cervix Rx: - Cytology - PAP - Cervicovaginal ancillary only - Comprehensive metabolic panel - TSH - MM Digital Screening; Future  2. Fibroids, submucosal - symptomatic, with heavy and painful periods - considering conception, so she is referred to Reproductive Endocrinology for Fertility Consult  3. Abnormal uterine bleeding (AUB) - Leiomyoma - history of severe anemia, requiring transfusion - taking iron and vitamins Rx: - CBC  4. Iron deficiency anemia due to chronic blood loss - from fibroids  5. Primary dysmenorrhea, probably related to fibroids Rx: - ibuprofen (ADVIL,MOTRIN) 800 MG tablet; Take 1 po q 8 hrs starting before period until period ends, monthly.  Dispense: 30 tablet; Refill: 11  6. Acute cystitis without hematuria Rx: - Urine Culture - Suprax samples dispensed for treatment  7. Obesity (BMI 35.0-39.9 without comorbidity) - doing well.  S/P Gastric Bypass  8. Routine adult health maintenance Rx: - Ambulatory referral to Internal Medicine  9. Screen for STD (sexually transmitted disease) Rx: - Hepatitis B surface antigen - Hepatitis C antibody - HIV antibody -  RPR    Plan:    Education reviewed: calcium supplements, depression evaluation, low fat, low cholesterol diet, safe sex/STD prevention, self breast exams and weight bearing exercise. Contraception: none. Follow up in: 1 year.   Meds ordered this encounter  Medications  . ibuprofen (ADVIL,MOTRIN) 800 MG tablet    Sig: Take 1 po q 8 hrs starting before period until period ends, monthly.    Dispense:  30 tablet    Refill:  11   Orders Placed This Encounter  Procedures  . Urine Culture    UTI symptoms  . MM Digital Screening    Standing Status:   Future    Standing Expiration Date:   09/06/2018    Order Specific Question:   Reason for Exam (SYMPTOM  OR DIAGNOSIS REQUIRED)    Answer:   Screening    Order Specific Question:   Is the patient pregnant?    Answer:   No    Order Specific Question:   Preferred imaging location?    Answer:  [facility]  . Hepatitis B surface antigen  . Hepatitis C antibody  . HIV antibody  . RPR  . Comprehensive metabolic panel  . TSH  . CBC  . Ambulatory referral to Internal Medicine    Referral Priority:   Routine    Referral Type:   Consultation    Referral Reason:   Specialty Services Required    Requested Specialty:   Internal Medicine    Number of Visits Requested:   1    [name] Bombard MD

## 2017-07-09 LAB — COMPREHENSIVE METABOLIC PANEL
ALT: 22 IU/L (ref 0–32)
AST: 25 IU/L (ref 0–40)
Albumin/Globulin Ratio: 1.5 (ref 1.2–2.2)
Albumin: 4.4 g/dL (ref 3.5–5.5)
Alkaline Phosphatase: 60 IU/L (ref 39–117)
BUN / CREAT RATIO: 26 — AB (ref 9–23)
BUN: 18 mg/dL (ref 6–24)
CHLORIDE: 104 mmol/L (ref 96–106)
CO2: 19 mmol/L — ABNORMAL LOW (ref 20–29)
Calcium: 9.1 mg/dL (ref 8.7–10.2)
Creatinine, Ser: 0.7 mg/dL (ref 0.57–1.00)
GFR calc non Af Amer: 104 mL/min/{1.73_m2} (ref >59)
GFR, EST AFRICAN AMERICAN: 119 mL/min/{1.73_m2} (ref >59)
Globulin, Total: 3 g/dL (ref 1.5–4.5)
Glucose: 89 mg/dL (ref 65–99)
Potassium: 4.7 mmol/L (ref 3.5–5.2)
Sodium: 141 mmol/L (ref 134–144)
TOTAL PROTEIN: 7.4 g/dL (ref 6.0–8.5)

## 2017-07-09 LAB — HIV ANTIBODY (ROUTINE TESTING W REFLEX): HIV Screen 4th Generation wRfx: NONREACTIVE

## 2017-07-09 LAB — HEPATITIS B SURFACE ANTIGEN: Hepatitis B Surface Ag: NEGATIVE

## 2017-07-09 LAB — CBC
Hematocrit: 35.5 % (ref 34.0–46.6)
Hemoglobin: 10.4 g/dL — ABNORMAL LOW (ref 11.1–15.9)
MCH: 19.8 pg — ABNORMAL LOW (ref 26.6–33.0)
MCHC: 29.3 g/dL — AB (ref 31.5–35.7)
MCV: 68 fL — AB (ref 79–97)
Platelets: 355 10*3/uL (ref 150–379)
RBC: 5.24 x10E6/uL (ref 3.77–5.28)
RDW: 18.3 % — AB (ref 12.3–15.4)
WBC: 6.5 10*3/uL (ref 3.4–10.8)

## 2017-07-09 LAB — TSH: TSH: 2.06 u[IU]/mL (ref 0.450–4.500)

## 2017-07-09 LAB — CERVICOVAGINAL ANCILLARY ONLY
Bacterial vaginitis: POSITIVE — AB
CANDIDA VAGINITIS: NEGATIVE
Chlamydia: NEGATIVE
Neisseria Gonorrhea: NEGATIVE
TRICH (WINDOWPATH): NEGATIVE

## 2017-07-09 LAB — RPR: RPR: NONREACTIVE

## 2017-07-09 LAB — HEPATITIS C ANTIBODY

## 2017-07-10 ENCOUNTER — Other Ambulatory Visit: Payer: Self-pay | Admitting: Obstetrics

## 2017-07-10 DIAGNOSIS — N3000 Acute cystitis without hematuria: Secondary | ICD-10-CM

## 2017-07-10 LAB — URINE CULTURE

## 2017-07-10 MED ORDER — CEFIXIME 400 MG PO CAPS: 400.0000 mg | ORAL_CAPSULE | Freq: Every day | ORAL | 0 refills | Status: DC

## 2017-07-11 LAB — CYTOLOGY - PAP: HPV: NOT DETECTED

## 2017-07-12 ENCOUNTER — Telehealth: Payer: Self-pay | Admitting: *Deleted

## 2017-07-12 NOTE — Telephone Encounter (Signed)
ERROR

## 2017-07-15 ENCOUNTER — Encounter: Payer: Self-pay | Admitting: Obstetrics

## 2017-07-15 ENCOUNTER — Other Ambulatory Visit: Payer: Self-pay

## 2017-07-15 ENCOUNTER — Ambulatory Visit (INDEPENDENT_AMBULATORY_CARE_PROVIDER_SITE_OTHER): Payer: BLUE CROSS/BLUE SHIELD | Admitting: Obstetrics

## 2017-07-15 VITALS — BP 116/65 | HR 70 | Ht 63.0 in | Wt 204.5 lb

## 2017-07-15 DIAGNOSIS — N870 Mild cervical dysplasia: Secondary | ICD-10-CM | POA: Diagnosis not present

## 2017-07-15 DIAGNOSIS — Z3202 Encounter for pregnancy test, result negative: Secondary | ICD-10-CM | POA: Diagnosis not present

## 2017-07-15 DIAGNOSIS — R87612 Low grade squamous intraepithelial lesion on cytologic smear of cervix (LGSIL): Secondary | ICD-10-CM | POA: Diagnosis not present

## 2017-07-15 LAB — POCT URINE PREGNANCY: PREG TEST UR: NEGATIVE

## 2017-07-15 NOTE — Progress Notes (Signed)
    GYNECOLOGY CLINIC COLPOSCOPY PROCEDURE NOTE  47 y.o. [id number] here for colposcopy for low-grade squamous intraepithelial neoplasia (LGSIL - encompassing HPV,mild dysplasia,CIN I)with negative HPV pap smear on 07-08-2017. Discussed role for HPV in cervical dysplasia, need for surveillance.  Patient given informed consent, signed copy in the chart, time out was performed.  Placed in lithotomy position. Cervix viewed with speculum and colposcope after application of acetic acid.   Colposcopy adequate? Yes  no visible lesions, no mosaicism, no punctation and no abnormal vasculature; corresponding biopsies obtained at 6 and 12 o'clock.  ECC specimen obtained. All specimens were labeled and sent to pathology.   Patient was given post procedure instructions.  Will follow up pathology and manage accordingly; patient will be contacted with results and recommendations.  Routine preventative health maintenance measures emphasized.    [name] Bombard MD, [place] Attending [place], [facility] for [employer], [place]

## 2017-07-15 NOTE — Progress Notes (Signed)
Presents for COLPO. UPT today is NEGATIVE.

## 2017-07-15 NOTE — Patient Instructions (Addendum)
Colposcopy Colposcopy is a procedure to examine the lowest part of the uterus (cervix), the vagina, and the area around the vaginal opening (vulva) for abnormalities or signs of disease. The procedure is done using a lighted microscope or magnifying lens (colposcope). If any unusual cells are found during the procedure, your health care provider may remove a tissue sample for testing (biopsy). A colposcopy may be done if you:  Have an abnormal Pap test. A Pap test is a screening test that is used to check for signs of cancer or infection of the vagina, cervix, and uterus.  Have a Pap smear test in which you test positive for high-risk HPV (human papillomavirus).  Have a sore or lesion on your cervix.  Have genital warts on your vulva, vagina, or cervix.  Took certain medicines while pregnant, such as diethylstilbestrol (DES).  Have pain during sexual intercourse.  Have vaginal bleeding, especially after sexual intercourse.  Need to have a cervical polyp removed.  Need to have a lost intrauterine device (IUD) string located.  Let your health care provider know about:  Any allergies you have, including allergies to prescribed medicine, latex, or iodine.  All medicines you are taking, including vitamins, herbs, eye drops, creams, and over-the-counter medicines. Bring a list of all of your medicines to your appointment.  Any problems you or family members have had with anesthetic medicines.  Any blood disorders you have.  Any surgeries you have had.  Any medical conditions you have, such as pelvic inflammatory disease (PID) or endometrial disorder.  Any history of frequent fainting.  Your menstrual cycle and what form of birth control (contraception) you use.  Your medical history, including any prior cervical treatment.  Whether you are pregnant or may be pregnant. What are the risks? Generally, this is a safe procedure. However, problems may occur,  including:  Pain.  Infection, which may include a fever, bad-smelling discharge, or pelvic pain.  Bleeding or discharge.  Misdiagnosis.  Fainting and vasovagal reactions, but this is rare.  Allergic reactions to medicines.  Damage to other structures or organs.  What happens before the procedure?  If you have your menstrual period or will have it at the time of your procedure, tell your health care provider. A colposcopy typically is not done during menstruation.  Continue your contraceptive practices before and after the procedure.  For 24 hours before the colposcopy: ? Do not douche. ? Do not use tampons. ? Do not use medicines, creams, or suppositories in the vagina. ? Do not have sexual intercourse.  Ask your health care provider about: ? Changing or stopping your regular medicines. This is especially important if you are taking diabetes medicines or blood thinners. ? Taking medicines such as aspirin and ibuprofen. These medicines can thin your blood. Do not take these medicines before your procedure if your health care provider instructs you not to. It is likely that your health care provider will tell you to avoid taking aspirin or medicine that contains aspirin for 7 days before the procedure.  Follow instructions from your health care provider about eating or drinking restrictions. You will likely need to eat a regular diet the day of the procedure and not skip any meals.  You may have an exam or testing. A pregnancy test will be taken on the day of the procedure.  You may have a blood or urine sample taken.  Plan to have someone take you home from the hospital or clinic.  If you will  be going home right after the procedure, plan to have someone with you for 24 hours. What happens during the procedure?  You will lie down on your back, with your feet in foot rests (stirrups).  A warmed and lubricated instrument (speculum) will be inserted into your vagina. The  speculum will be used to hold apart the walls of your vagina so your health care provider can see your cervix and the inside of your vagina.  A cotton swab will be used to place a small amount of liquid solution on the areas to be examined. This solution makes it easier to see abnormal cells. You may feel a slight burning during this part.  The colposcope will be used to scan the cervix with a bright white light. The colposcope will be held near your vulvaand will magnify your vulva, vagina, and cervix for easier examination.  Your health care provider may decide to take a biopsy. If so: ? You may be given medicine to numb the area (local anesthetic). ? Surgical instruments will be used to suck out mucus and cells through your vagina. ? You may feel mild pain while the tissue sample is removed. ? Bleeding may occur. A solution may be used to stop the bleeding. ? If a sample of tissue is needed from the inside of the cervix, a different procedure called endocervical curettage (ECC) may be completed. During this procedure, a curved instrument (curette) will be used to scrape cells from your cervix or the top of your cervix (endocervix).  Your health care provider will record the location of any abnormalities. The procedure may vary among health care providers and hospitals. What happens after the procedure?  You will lie down and rest for a few minutes. You may be offered juice or cookies.  Your blood pressure, heart rate, breathing rate, and blood oxygen level will be monitored until any medicines you were given have worn off.  You may have to wear compression stockings. These stockings help to prevent blood clots and reduce swelling in your legs.  You may have some cramping in your abdomen. This should go away after a few minutes. This information is not intended to replace advice given to you by your health care provider. Make sure you discuss any questions you have with your health care  provider. Document Released: 08/29/2002 Document Revised: 02/04/2016 Document Reviewed: 01/13/2016 Elsevier Interactive Patient Education  2018 Elsevier Inc. Colposcopy, Care After This sheet gives you information about how to care for yourself after your procedure. Your health care provider may also give you more specific instructions. If you have problems or questions, contact your health care provider. What can I expect after the procedure? If you had a colposcopy without a biopsy, you can expect to feel fine right away, but you may have some spotting for a few days. You can go back to your normal activities. If you had a colposcopy with a biopsy, it is common to have:  Soreness and pain. This may last for a few days.  Light-headedness.  Mild vaginal bleeding or dark-colored, grainy discharge. This may last for a few days. The discharge may be due to a solution that was used during the procedure. You may need to wear a sanitary pad during this time.  Spotting for at least 48 hours after the procedure.  Follow these instructions at home:  Take over-the-counter and prescription medicines only as told by your health care provider. Talk with your health care provider about what   type of over-the-counter pain medicine and prescription medicine you can start taking again. It is especially important to talk with your health care provider if you take blood-thinning medicine.  Do not drive or use heavy machinery while taking prescription pain medicine.  For at least 3 days after your procedure, or as long as told by your health care provider, avoid: ? Douching. ? Using tampons. ? Having sexual intercourse.  Continue to use birth control (contraception).  Limit your physical activity for the first day after the procedure as told by your health care provider. Ask your health care provider what activities are safe for you.  It is up to you to get the results of your procedure. Ask your health  care provider, or the department performing the procedure, when your results will be ready.  Keep all follow-up visits as told by your health care provider. This is important. Contact a health care provider if:  You develop a skin rash. Get help right away if:  You are bleeding heavily from your vagina or you are passing blood clots. This includes using more than one sanitary pad per hour for 2 hours in a row.  You have a fever or chills.  You have pelvic pain.  You have abnormal, yellow-colored, or bad-smelling vaginal discharge. This could be a sign of infection.  You have severe pain or cramps in your lower abdomen that do not get better with medicine.  You feel light-headed or dizzy, or you faint. Summary  If you had a colposcopy without a biopsy, you can expect to feel fine right away, but you may have some spotting for a few days. You can go back to your normal activities.  If you had a colposcopy with a biopsy, you may notice mild pain and spotting for 48 hours after the procedure.  Avoid douching, using tampons, and having sexual intercourse for 3 days after the procedure or as long as told by your health care provider.  Contact your health care provider if you have bleeding, severe pain, or signs of infection. This information is not intended to replace advice given to you by your health care provider. Make sure you discuss any questions you have with your health care provider. Document Released: 03/29/2013 Document Revised: 01/24/2016 Document Reviewed: 01/24/2016 Elsevier Interactive Patient Education  2018 Elsevier Inc.  

## 2017-07-29 ENCOUNTER — Ambulatory Visit: Payer: BLUE CROSS/BLUE SHIELD | Admitting: Obstetrics

## 2017-08-02 ENCOUNTER — Ambulatory Visit (INDEPENDENT_AMBULATORY_CARE_PROVIDER_SITE_OTHER): Payer: BLUE CROSS/BLUE SHIELD | Admitting: Obstetrics and Gynecology

## 2017-08-02 ENCOUNTER — Encounter: Payer: Self-pay | Admitting: Obstetrics and Gynecology

## 2017-08-02 DIAGNOSIS — R87612 Low grade squamous intraepithelial lesion on cytologic smear of cervix (LGSIL): Secondary | ICD-10-CM

## 2017-08-02 NOTE — Progress Notes (Signed)
Pt here for discussion of colpo Bx results. Colpo d/t LGSIL.  Bx results CIN 1 Results reviewed with pt. Pt still desires pregnancy and has appt to see [facility] later this month  PE AF VSS Lungs clear Heart RRR Abd soft + BS  A/P LGSIL        Desire for Pregnancy Pt to have consult with [facility] before making a decision regarding CIN 1. Pt informed can treat with cryosurgery or follow up with yearly pap smears with HPV co testing.

## 2017-08-03 ENCOUNTER — Ambulatory Visit: Payer: BLUE CROSS/BLUE SHIELD

## 2017-08-04 ENCOUNTER — Other Ambulatory Visit: Payer: Self-pay

## 2017-08-04 ENCOUNTER — Other Ambulatory Visit: Payer: Self-pay | Admitting: Obstetrics

## 2017-08-04 DIAGNOSIS — N3000 Acute cystitis without hematuria: Secondary | ICD-10-CM

## 2017-08-04 MED ORDER — CEFUROXIME AXETIL 500 MG PO TABS: 500.0000 mg | ORAL_TABLET | Freq: Two times a day (BID) | ORAL | 0 refills | Status: DC

## 2017-08-04 NOTE — Telephone Encounter (Signed)
Returned call, pt stated that she is having same urinary symptoms such as discharge and cloudy urine. Pt is requesting refill on suprax, advised I would route to provider for review.

## 2017-08-06 ENCOUNTER — Telehealth: Payer: Self-pay

## 2017-08-06 NOTE — Telephone Encounter (Signed)
Advised of rx sent by provider.

## 2017-08-13 DIAGNOSIS — D251 Intramural leiomyoma of uterus: Secondary | ICD-10-CM | POA: Diagnosis not present

## 2017-08-13 DIAGNOSIS — N924 Excessive bleeding in the premenopausal period: Secondary | ICD-10-CM | POA: Diagnosis not present

## 2017-10-20 ENCOUNTER — Encounter (HOSPITAL_BASED_OUTPATIENT_CLINIC_OR_DEPARTMENT_OTHER): Payer: Self-pay | Admitting: *Deleted

## 2017-10-22 ENCOUNTER — Ambulatory Visit (HOSPITAL_BASED_OUTPATIENT_CLINIC_OR_DEPARTMENT_OTHER)
Admission: RE | Admit: 2017-10-22 | Payer: BLUE CROSS/BLUE SHIELD | Source: Ambulatory Visit | Admitting: Obstetrics and Gynecology

## 2017-10-22 ENCOUNTER — Encounter (HOSPITAL_BASED_OUTPATIENT_CLINIC_OR_DEPARTMENT_OTHER): Admission: RE | Payer: Self-pay | Source: Ambulatory Visit

## 2017-10-22 HISTORY — DX: Female infertility, unspecified: N97.9

## 2017-10-22 HISTORY — DX: Leiomyoma of uterus, unspecified: D25.9

## 2017-10-22 HISTORY — DX: Personal history of other complications of pregnancy, childbirth and the puerperium: Z87.59

## 2017-10-22 SURGERY — LAPAROSCOPIC GELPORT ASSISTED MYOMECTOMY
Anesthesia: General

## 2017-11-18 ENCOUNTER — Encounter: Payer: Self-pay | Admitting: Obstetrics

## 2017-11-18 ENCOUNTER — Ambulatory Visit (INDEPENDENT_AMBULATORY_CARE_PROVIDER_SITE_OTHER): Payer: BLUE CROSS/BLUE SHIELD | Admitting: Obstetrics

## 2017-11-18 VITALS — BP 119/66 | HR 79 | Wt 194.0 lb

## 2017-11-18 DIAGNOSIS — R399 Unspecified symptoms and signs involving the genitourinary system: Secondary | ICD-10-CM

## 2017-11-18 DIAGNOSIS — N898 Other specified noninflammatory disorders of vagina: Secondary | ICD-10-CM

## 2017-11-18 LAB — POCT URINALYSIS DIPSTICK
Bilirubin, UA: NEGATIVE
Glucose, UA: NEGATIVE
Ketones, UA: NEGATIVE
NITRITE UA: POSITIVE
PH UA: 7 (ref 5.0–8.0)
PROTEIN UA: POSITIVE — AB
SPEC GRAV UA: 1.015 (ref 1.010–1.025)
UROBILINOGEN UA: 0.2 U/dL

## 2017-11-18 MED ORDER — METRONIDAZOLE 1.3 % VA GEL: 1.0000 | Freq: Every evening | VAGINAL | 4 refills | Status: DC | PRN

## 2017-11-18 NOTE — Progress Notes (Signed)
Patient ID: Sherry Blair, female   DOB: 1970-11-03, 47 y.o.   MRN: [id number]  Chief Complaint  Patient presents with  . Vaginitis    HPI Sherry Blair is a 47 y.o. female.  Malodorous vaginal discharge and lower abdominal cramping. HPI  Past Medical History:  Diagnosis Date  . History of ectopic pregnancy 07/20/2009  . Infertility, female   . Leiomyoma of uterus     Past Surgical History:  Procedure Laterality Date  . DX LAPAROSCOPY/  EXPLORATORY LAPARTOMY/ RIGHT SALPINGECTOMY/ WEDGE RESECTION FO THE UTERINE CORNU ON THE RIGHT , ECTOPIC PREGNANCY  07-20-2009    dr Delsa Sale  [facility]  . GASTRIC BYPASS      Family History  Problem Relation Age of Onset  . Hyperlipidemia Mother   . Lupus Mother   . Hypertension Father   . Diabetes Father     Social History Social History   Tobacco Use  . Smoking status: Never Smoker  . Smokeless tobacco: Never Used  Substance Use Topics  . Alcohol use: No    Alcohol/week: 0.0 oz  . Drug use: No    No Known Allergies  Current Outpatient Medications  Medication Sig Dispense Refill  . Biotin 5000 MCG CAPS Take by mouth.    . cholecalciferol (VITAMIN D) 1000 units tablet Take 1,000 Units by mouth daily.    . ferrous sulfate 325 (65 FE) MG tablet Take 1 tablet (325 mg total) by mouth 3 (three) times daily with meals. 90 tablet 3  . ibuprofen (ADVIL,MOTRIN) 800 MG tablet Take 1 po q 8 hrs starting before period until period ends, monthly. 30 tablet 11  . metroNIDAZOLE (NUVESSA) 1.3 % GEL Place 1 Applicatorful vaginally at bedtime as needed. 1 Tube 4   No current facility-administered medications for this visit.     Review of Systems Review of Systems Constitutional: negative for fatigue and weight loss Respiratory: negative for cough and wheezing Cardiovascular: negative for chest pain, fatigue and palpitations Gastrointestinal: negative for abdominal pain and change in bowel habits Genitourinary:POSITIVE for malodorous  vaginal discharge Integument/breast: negative for nipple discharge Musculoskeletal:negative for myalgias Neurological: negative for gait problems and tremors Behavioral/Psych: negative for abusive relationship, depression Endocrine: negative for temperature intolerance      Blood pressure 119/66, pulse 79, weight 194 lb (88 kg).  Physical Exam Physical Exam           General:  Alert and no distress Abdomen:  normal findings: no organomegaly, soft, non-tender and no hernia  Pelvis:  External genitalia: normal general appearance Urinary system: urethral meatus normal and bladder without fullness, nontender Vaginal: normal without tenderness, induration or masses Cervix: normal appearance Adnexa: normal bimanual exam Uterus: anteverted and non-tender, normal size    50% of 15 min visit spent on counseling and coordination of care.   Data Reviewed Wet Prep  Assessment     1. Vaginal discharge Rx: - Cervicovaginal ancillary only - metroNIDAZOLE (NUVESSA) 1.3 % GEL; Place 1 Applicatorful vaginally at bedtime as needed.  Dispense: 1 Tube; Refill: 4  2. Vaginal odor Rx: - Cervicovaginal ancillary only  3. UTI symptoms Rx: - POCT urinalysis dipstick - Urine Culture   Plan    Follow up prn   Orders Placed This Encounter  Procedures  . Urine Culture  . POCT urinalysis dipstick   Meds ordered this encounter  Medications  . metroNIDAZOLE (NUVESSA) 1.3 % GEL    Sig: Place 1 Applicatorful vaginally at bedtime as needed.    Dispense:  1 Tube    Refill:  4    [name] Bombard MD 11-18-2017

## 2017-11-19 LAB — CERVICOVAGINAL ANCILLARY ONLY
BACTERIAL VAGINITIS: POSITIVE — AB
CANDIDA VAGINITIS: NEGATIVE

## 2017-11-20 ENCOUNTER — Other Ambulatory Visit: Payer: Self-pay | Admitting: Obstetrics

## 2017-11-22 ENCOUNTER — Other Ambulatory Visit: Payer: Self-pay | Admitting: Obstetrics

## 2017-11-22 DIAGNOSIS — N390 Urinary tract infection, site not specified: Principal | ICD-10-CM

## 2017-11-22 DIAGNOSIS — B9620 Unspecified Escherichia coli [E. coli] as the cause of diseases classified elsewhere: Secondary | ICD-10-CM

## 2017-11-22 LAB — URINE CULTURE

## 2017-11-22 MED ORDER — CEFIXIME 400 MG PO CAPS: 400.0000 mg | ORAL_CAPSULE | Freq: Every day | ORAL | 0 refills | Status: DC

## 2018-01-14 ENCOUNTER — Other Ambulatory Visit: Payer: Self-pay | Admitting: Obstetrics

## 2018-01-20 DIAGNOSIS — Z202 Contact with and (suspected) exposure to infections with a predominantly sexual mode of transmission: Secondary | ICD-10-CM | POA: Diagnosis not present

## 2018-01-20 DIAGNOSIS — R102 Pelvic and perineal pain: Secondary | ICD-10-CM | POA: Diagnosis not present

## 2018-01-20 DIAGNOSIS — N898 Other specified noninflammatory disorders of vagina: Secondary | ICD-10-CM | POA: Diagnosis not present

## 2018-01-20 DIAGNOSIS — B9689 Other specified bacterial agents as the cause of diseases classified elsewhere: Secondary | ICD-10-CM | POA: Diagnosis not present

## 2018-02-03 ENCOUNTER — Telehealth: Payer: Self-pay

## 2018-02-03 NOTE — Telephone Encounter (Signed)
TC from pt requesting Rx for BV Pt has refills on gel Rx  However wants clindamycin Rx is not in protocol  Please advise.

## 2018-02-04 ENCOUNTER — Other Ambulatory Visit: Payer: Self-pay | Admitting: Obstetrics

## 2018-02-04 DIAGNOSIS — B9689 Other specified bacterial agents as the cause of diseases classified elsewhere: Secondary | ICD-10-CM

## 2018-02-04 DIAGNOSIS — N760 Acute vaginitis: Secondary | ICD-10-CM

## 2018-02-04 MED ORDER — CLINDAMYCIN HCL 300 MG PO CAPS: 300.0000 mg | ORAL_CAPSULE | Freq: Three times a day (TID) | ORAL | 0 refills | Status: DC

## 2018-02-04 NOTE — Telephone Encounter (Signed)
Clindamycin Rx

## 2018-02-17 ENCOUNTER — Other Ambulatory Visit: Payer: Self-pay

## 2018-02-17 DIAGNOSIS — D500 Iron deficiency anemia secondary to blood loss (chronic): Secondary | ICD-10-CM

## 2018-02-17 MED ORDER — FERROUS SULFATE 325 (65 FE) MG PO TABS: 325.0000 mg | ORAL_TABLET | Freq: Three times a day (TID) | ORAL | 3 refills | Status: DC

## 2018-02-17 NOTE — Progress Notes (Signed)
Ok per Dr.Harper to send Refill Rx for pt  TC to pt to make aware.

## 2018-04-24 ENCOUNTER — Other Ambulatory Visit: Payer: Self-pay | Admitting: Obstetrics

## 2018-04-24 DIAGNOSIS — N760 Acute vaginitis: Secondary | ICD-10-CM

## 2018-04-24 DIAGNOSIS — B9689 Other specified bacterial agents as the cause of diseases classified elsewhere: Secondary | ICD-10-CM

## 2018-05-11 ENCOUNTER — Other Ambulatory Visit: Payer: Self-pay | Admitting: Obstetrics

## 2018-07-22 DIAGNOSIS — N924 Excessive bleeding in the premenopausal period: Secondary | ICD-10-CM | POA: Diagnosis not present

## 2018-07-22 DIAGNOSIS — D251 Intramural leiomyoma of uterus: Secondary | ICD-10-CM | POA: Diagnosis not present

## 2018-07-23 ENCOUNTER — Other Ambulatory Visit: Payer: Self-pay | Admitting: Obstetrics

## 2018-07-23 DIAGNOSIS — N944 Primary dysmenorrhea: Secondary | ICD-10-CM

## 2018-08-03 DIAGNOSIS — D251 Intramural leiomyoma of uterus: Secondary | ICD-10-CM | POA: Diagnosis not present

## 2018-08-24 DIAGNOSIS — R509 Fever, unspecified: Secondary | ICD-10-CM | POA: Diagnosis not present

## 2018-08-24 DIAGNOSIS — B349 Viral infection, unspecified: Secondary | ICD-10-CM | POA: Diagnosis not present

## 2018-09-05 DIAGNOSIS — J209 Acute bronchitis, unspecified: Secondary | ICD-10-CM | POA: Diagnosis not present

## 2018-09-13 ENCOUNTER — Encounter (HOSPITAL_COMMUNITY): Payer: Self-pay | Admitting: *Deleted

## 2018-09-13 ENCOUNTER — Emergency Department (HOSPITAL_COMMUNITY): Payer: BLUE CROSS/BLUE SHIELD

## 2018-09-13 ENCOUNTER — Emergency Department (HOSPITAL_COMMUNITY)
Admission: EM | Admit: 2018-09-13 | Discharge: 2018-09-13 | Disposition: A | Discharge status: Home / Self Care | Payer: BLUE CROSS/BLUE SHIELD | Attending: Emergency Medicine | Admitting: Emergency Medicine

## 2018-09-13 ENCOUNTER — Other Ambulatory Visit: Payer: Self-pay

## 2018-09-13 DIAGNOSIS — J988 Other specified respiratory disorders: Secondary | ICD-10-CM | POA: Diagnosis not present

## 2018-09-13 DIAGNOSIS — Z79899 Other long term (current) drug therapy: Secondary | ICD-10-CM | POA: Diagnosis not present

## 2018-09-13 DIAGNOSIS — R05 Cough: Secondary | ICD-10-CM | POA: Diagnosis not present

## 2018-09-13 DIAGNOSIS — R0602 Shortness of breath: Secondary | ICD-10-CM | POA: Diagnosis present

## 2018-09-13 IMAGING — CR CHEST - 2 VIEW
2 series · 2 of 2 positions shown · non-contrast
Comparison: [DATE]

CLINICAL DATA: Cough for 3 weeks with fever

EXAM:
CHEST - 2 VIEW

[w chest pa]
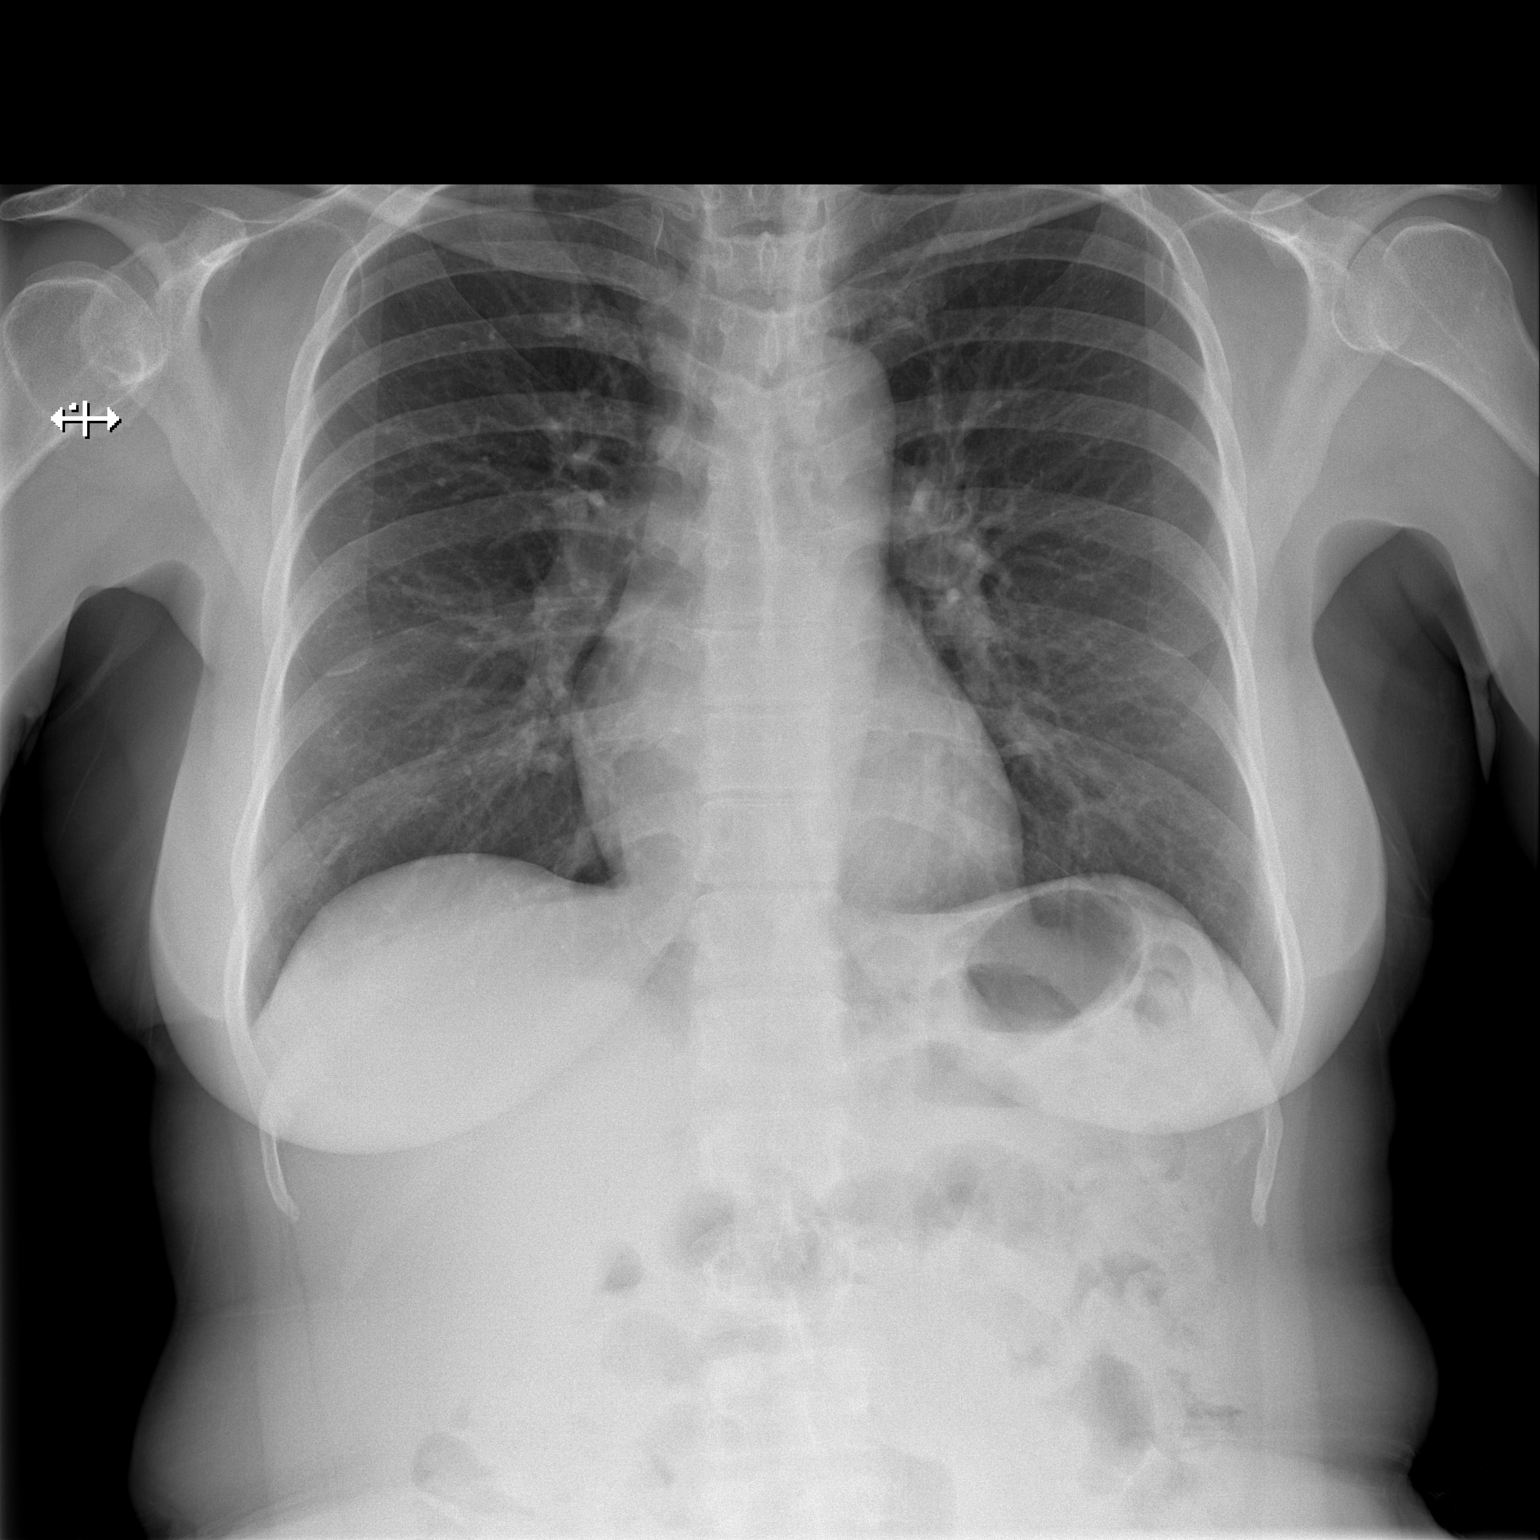

[w chest lat]
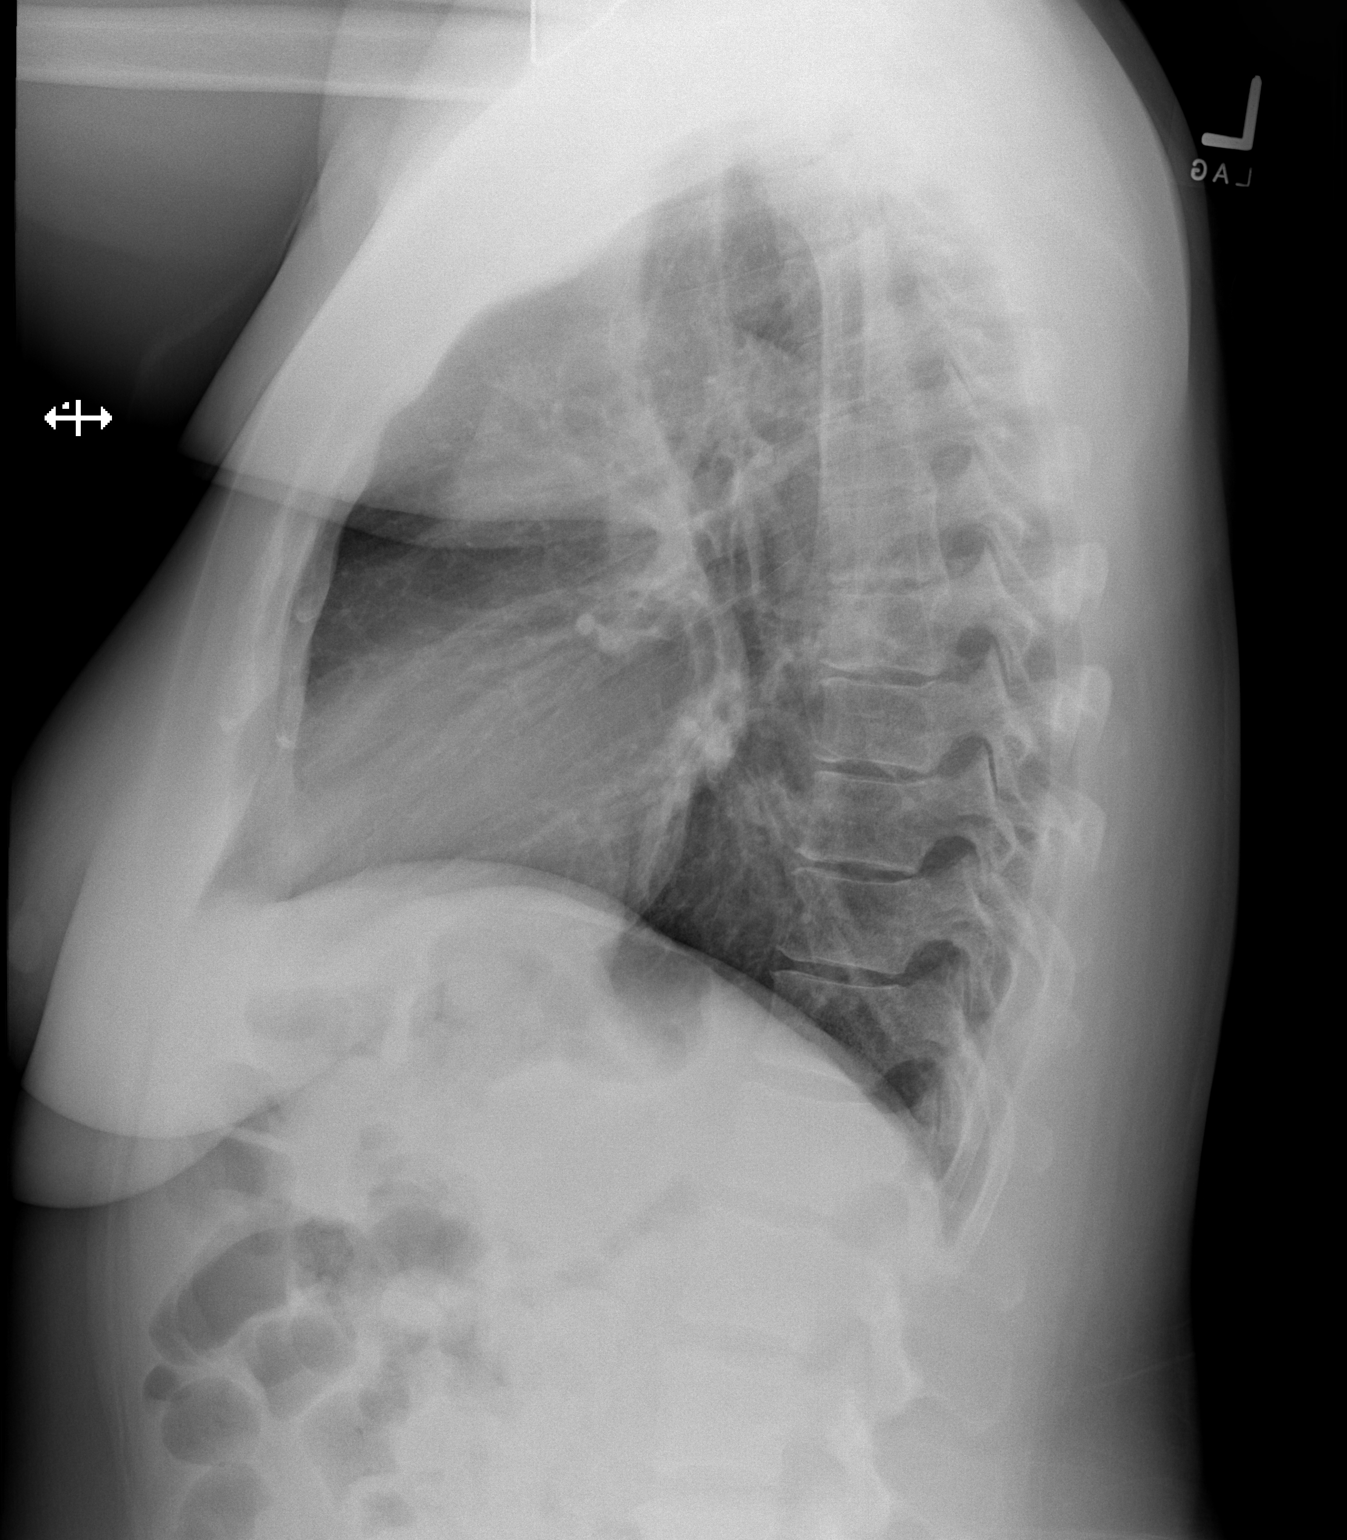

[2 of 2 positions shown; findings below may reference images not displayed]

FINDINGS: The heart size and mediastinal contours are within normal limits.
Both lungs are clear. The visualized skeletal structures are
unremarkable.
IMPRESSION: No active cardiopulmonary disease.

## 2018-09-13 MED ORDER — GUAIFENESIN-CODEINE 100-10 MG/5ML PO SOLN
10.0000 mL | Freq: Once | ORAL | Status: AC
  Administered 2018-09-13: 10 mL via ORAL
  Filled 2018-09-13: qty 10

## 2018-09-13 MED ORDER — AZITHROMYCIN 250 MG PO TABS: 250.0000 mg | ORAL_TABLET | Freq: Every day | ORAL | 0 refills | Status: DC

## 2018-09-13 MED ORDER — IBUPROFEN 200 MG PO TABS
600.0000 mg | ORAL_TABLET | Freq: Once | ORAL | Status: AC
  Administered 2018-09-13: 600 mg via ORAL
  Filled 2018-09-13: qty 3

## 2018-09-13 MED ORDER — AZITHROMYCIN 250 MG PO TABS
500.0000 mg | ORAL_TABLET | Freq: Once | ORAL | Status: AC
  Administered 2018-09-13: 500 mg via ORAL
  Filled 2018-09-13: qty 2

## 2018-09-13 MED ORDER — ALBUTEROL SULFATE HFA 108 (90 BASE) MCG/ACT IN AERS
2.0000 | INHALATION_SPRAY | Freq: Once | RESPIRATORY_TRACT | Status: AC
  Administered 2018-09-13: 2 via RESPIRATORY_TRACT
  Filled 2018-09-13: qty 6.7

## 2018-09-13 NOTE — ED Notes (Signed)
Pt reports sob with non-productive cough since March 3rd.  She has been to UC twice for same, she was given cough meds and nasal spray without relief.  She reports worsening cough.

## 2018-09-13 NOTE — ED Notes (Signed)
Pt transported to XRay 

## 2018-09-13 NOTE — ED Provider Notes (Signed)
[facility] DEPT Provider Note   CSN: [id number] Arrival date & time: 09/13/18  2045    History   Chief Complaint Chief Complaint  Patient presents with  . Shortness of Breath    HPI Sherry Blair is a 48 y.o. female.     HPI   47yF with cough and intermittent fevers. Onset 3w ago. Went to UC twice for this. Told viral illness and allergies. Given something for cough and a nasal spray which she has not been using. She feels like her cough is now worsening. Some fatigue. No sore throat. No GI complaints.   Past Medical History:  Diagnosis Date  . History of ectopic pregnancy 07/20/2009  . Infertility, female   . Leiomyoma of uterus     Patient Active Problem List   Diagnosis Date Noted  . 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 Symptomatic anemia 12/04/2015  . Abnormal uterine bleeding (AUB) 02/07/2014  . Dyspnea on exertion 04/06/2013  . Chest pain 04/06/2013  . Iron deficiency anemia 04/06/2013  . Thrombocytosis ([place]) 04/06/2013    Past Surgical History:  Procedure Laterality Date  . DX LAPAROSCOPY/  EXPLORATORY LAPARTOMY/ RIGHT SALPINGECTOMY/ WEDGE RESECTION FO THE UTERINE CORNU ON THE RIGHT , ECTOPIC PREGNANCY  07-20-2009    dr Delsa Sale  [facility]  . GASTRIC BYPASS       OB History    Gravida  4   Para  2   Term  2   Preterm      AB  2   Living  2     SAB      TAB  1   Ectopic  1   Multiple      Live Births  2            Home Medications    Prior to Admission medications   Medication Sig Start Date End Date Taking? Authorizing Provider  Biotin 5000 MCG CAPS Take by mouth.    [provider]  cefixime (SUPRAX) 400 MG CAPS capsule Take 1 capsule (400 mg total) by mouth daily. 11/22/17   [name] Bombard, MD  cefUROXime (CEFTIN) 500 MG tablet TAKE 1 TABLET (500 MG TOTAL) BY MOUTH 2 (TWO) TIMES DAILY WITH A MEAL. 05/11/18   [name] Bombard, MD  cholecalciferol (VITAMIN D) 1000 units tablet  Take 1,000 Units by mouth daily.    [provider]  clindamycin (CLEOCIN) 300 MG capsule TAKE 1 CAPSULE BY MOUTH THREE TIMES A DAY 04/25/18   [name] Bombard, MD  ferrous sulfate 325 (65 FE) MG tablet Take 1 tablet (325 mg total) by mouth 3 (three) times daily with meals. 02/17/18   [name] Bombard, MD  ibuprofen (ADVIL,MOTRIN) 800 MG tablet TAKE 1 TABLET BY MOUTH EVERY 8 HOURS START BEFORE PERIOD UNTIL PERIOD ENDS, MONTHLY 07/23/18   [name] Bombard, MD  metroNIDAZOLE (NUVESSA) 1.3 % GEL Place 1 Applicatorful vaginally at bedtime as needed. 11/18/17   [name] Bombard, MD    Family History Family History  Problem Relation Age of Onset  . Hyperlipidemia Mother   . Lupus Mother   . Hypertension Father   . Diabetes Father     Social History Social History   Tobacco Use  . Smoking status: Never Smoker  . Smokeless tobacco: Never Used  Substance Use Topics  . Alcohol use: No    Alcohol/week: 0.0 standard drinks  . Drug use: No    Allergies   Patient  has no known allergies.   Review of Systems Review of Systems  All systems reviewed and negative, other than as noted in HPI.  Physical Exam Updated Vital Signs BP 134/64 (BP Location: Right Arm)   Pulse 88   Temp 99.6 F (37.6 C) (Oral)   Resp (!) 24   Ht 5\' 3"  (1.6 m)   Wt 86.2 kg   LMP 08/19/2018   SpO2 100%   BMI 33.66 kg/m   Physical Exam Vitals signs and nursing note reviewed.  Constitutional:      General: She is not in acute distress.    Appearance: She is well-developed.  HENT:     Head: Normocephalic and atraumatic.  Eyes:     General:        Right eye: No discharge.        Left eye: No discharge.     Conjunctiva/sclera: Conjunctivae normal.  Neck:     Musculoskeletal: Neck supple.  Cardiovascular:     Rate and Rhythm: Normal rate and regular rhythm.     Heart sounds: Normal heart sounds. No murmur. No friction rub. No gallop.   Pulmonary:     Effort: Pulmonary effort is normal. No  respiratory distress.     Breath sounds: Normal breath sounds.  Abdominal:     General: There is no distension.     Palpations: Abdomen is soft.     Tenderness: There is no abdominal tenderness.  Musculoskeletal:        General: No tenderness.  Skin:    General: Skin is warm and dry.  Neurological:     Mental Status: She is alert.  Psychiatric:        Behavior: Behavior normal.        Thought Content: Thought content normal.      ED Treatments / Results  Labs (all labs ordered are listed, but only abnormal results are displayed) Labs Reviewed - No data to display  EKG None  Radiology Dg Chest 2 View  Result Date: 09/13/2018 CLINICAL DATA:  Cough for 3 weeks with fever EXAM: CHEST - 2 VIEW COMPARISON:  04/06/2013 FINDINGS: The heart size and mediastinal contours are within normal limits. Both lungs are clear. The visualized skeletal structures are unremarkable. IMPRESSION: No active cardiopulmonary disease. Electronically Signed   By: Ashley Royalty M.D.   On: 09/13/2018 22:02    Procedures Procedures (including critical care time)  Medications Ordered in ED Medications  albuterol (PROVENTIL HFA;VENTOLIN HFA) 108 (90 Base) MCG/ACT inhaler 2 puff (2 puffs Inhalation Given 09/13/18 2142)  ibuprofen (ADVIL,MOTRIN) tablet 600 mg (600 mg Oral Given 09/13/18 2140)  guaiFENesin-codeine 100-10 MG/5ML solution 10 mL (10 mLs Oral Given 09/13/18 2141)     Initial Impression / Assessment and Plan / ED Course  I have reviewed the triage vital signs and the nursing notes.  Pertinent labs & imaging results that were available during my care of the patient were reviewed by me and considered in my medical decision making (see chart for details).   47yF with 3w of respiratory symptoms and reported fever. Well appearing on exam. She is PERC negative. Not tachycardic, hypoxic. No hx of DVT/PE. No signs/symptoms of DVT. No exogenous hormone use. No no malignancy. No recent surgery or  immobilization. She reports was tested for influenza and negative. CXR done today and is actually pretty unremarkable. No known contacts with covid or travel to high incidence areas at that point. Timeline is not consistent with this anyways. Possible some  component of allergies? She was advised to take nasal spray but hasnt because she says she doesn't feel congested.   Final Clinical Impressions(s) / ED Diagnoses   Final diagnoses:  Respiratory infection    ED Discharge Orders    None       Virgel Manifold, MD 09/14/18 1932

## 2018-09-13 NOTE — ED Notes (Signed)
Bed: WA13 Expected date:  Expected time:  Means of arrival:  Comments: Triage 2 

## 2018-09-21 DIAGNOSIS — N189 Chronic kidney disease, unspecified: Secondary | ICD-10-CM

## 2018-09-21 HISTORY — DX: Chronic kidney disease, unspecified: N18.9

## 2019-01-24 DIAGNOSIS — Z20828 Contact with and (suspected) exposure to other viral communicable diseases: Secondary | ICD-10-CM | POA: Diagnosis not present

## 2019-02-09 NOTE — Patient Instructions (Signed)
YOU NEED TO HAVE A COVID 19 TEST ON_Sat. 8/22______ @_______ , THIS TEST MUST BE DONE BEFORE SURGERY, COME  [address], [place] , [address]. ONCE YOUR COVID TEST IS COMPLETED, PLEASE BEGIN THE QUARANTINE INSTRUCTIONS AS OUTLINED IN YOUR HANDOUT.                Arlette Broshears    Your procedure is scheduled on: Wednesday 02/15/19   Report to [facility] at 8:15 am   1 VISITOR IS ALLOWED TO WAIT IN WAITING ROOM  ONLY DAY OF YOUR SURGERY. NO VISITORS ARE ALLOWED OVERNIGT   Call this number if you have problems the morning of surgery [phone]   BRUSH YOUR TEETH MORNING OF SURGERY AND RINSE YOUR MOUTH OUT, NO CHEWING GUM CANDY OR MINTS.    CLEAR LIQUID DIET   Foods Allowed                                                                     Foods Excluded  Coffee and tea, regular and decaf                             liquids that you cannot  Plain Jell-O any favor except red or purple                                           see through such as: Fruit ices (not with fruit pulp)                                     milk, soups, orange juice  Iced Popsicles                                    All solid food Carbonated beverages, regular and diet                                    Cranberry, grape and apple juices Sports drinks like Gatorade Lightly seasoned clear broth or consume(fat free) Sugar, honey syrup    _____________________________________________________________________   Do not eat food After Midnight.   YOU MAY HAVE CLEAR LIQUIDS FROM MIDNIGHT UNTIL 7:15 AM.   At 7:15AM Please finish the prescribed Pre-Surgery drink . Nothing by mouth after you finish the Gatorade drink !   Take these medicines the morning of surgery with A SIP OF WATER: none                                You may not have any metal on your body including hair pins and              piercings             Do not wear jewelry, make-up, lotions, powders or perfumes,  deodorant  Do not wear nail polish.  Do not shave  48 hours prior to surgery.               Do not bring valuables to the hospital. [place].  Contacts, dentures or bridgework may not be worn into surgery.      Patients discharged the day of surgery will not be allowed to drive home.  IF YOU ARE HAVING SURGERY AND GOING HOME THE SAME DAY, YOU MUST HAVE AN ADULT TO DRIVE YOU HOME AND BE WITH YOU FOR 24 HOURS.  YOU MAY GO HOME BY TAXI OR UBER OR ORTHERWISE, BUT AN ADULT MUST ACCOMPANY YOU HOME AND STAY WITH YOU FOR 24 HOURS.  Name and phone number of your driver:  Special Instructions: N/A              Please read over the following fact sheets you were given: _____________________________________________________________________             [facility] - Preparing for Surgery  Before surgery, you can play an important role.   Because skin is not sterile, your skin needs to be as free of germs as possible.  You can reduce the number of germs on your skin by washing with CHG (chlorahexidine gluconate) soap before surgery.   CHG is an antiseptic cleaner which kills germs and bonds with the skin to continue killing germs even after washing. Please DO NOT use if you have an allergy to CHG or antibacterial soaps.   If your skin becomes reddened/irritated stop using the CHG and inform your nurse when you arrive at Short Stay. Do not shave (including legs and underarms) for at least 48 hours prior to the first CHG shower.   Please follow these instructions carefully:   1.  Shower with CHG Soap the night before surgery and the  morning of Surgery.  2.  If you choose to wash your hair, wash your hair first as usual with your  normal  shampoo.  3.  After you shampoo, rinse your hair and body thoroughly to remove the  shampoo.                                        4.  Use CHG as you would any other liquid soap.  You can apply chg  directly  to the skin and wash                       Gently with a scrungie or clean washcloth.  5.  Apply the CHG Soap to your body ONLY FROM THE NECK DOWN.   Do not use on face/ open                           Wound or open sores. Avoid contact with eyes, ears mouth and genitals (private parts).                       Wash face,  Genitals (private parts) with your normal soap.             6.  Wash thoroughly, paying special attention to the area where your surgery  will be performed.  7.  Thoroughly rinse  your body with warm water from the neck down.  8.  DO NOT shower/wash with your normal soap after using and rinsing off  the CHG Soap.             9.  Pat yourself dry with a clean towel.            10.  Wear clean pajamas.            11.  Place clean sheets on your bed the night of your first shower and do not  sleep with pets . Day of Surgery : Do not apply any lotions/deodorants the morning of surgery.  Please wear clean clothes to the hospital/surgery center.   FAILURE TO FOLLOW THESE INSTRUCTIONS MAY RESULT IN THE CANCELLATION OF YOUR SURGERY PATIENT SIGNATURE_________________________________  NURSE SIGNATURE__________________________________  ________________________________________________________________________

## 2019-02-10 ENCOUNTER — Encounter (HOSPITAL_COMMUNITY): Payer: Self-pay

## 2019-02-10 ENCOUNTER — Other Ambulatory Visit: Payer: Self-pay

## 2019-02-10 ENCOUNTER — Encounter (HOSPITAL_COMMUNITY)
Admission: RE | Admit: 2019-02-10 | Discharge: 2019-02-10 | Disposition: A | Discharge status: Home / Self Care | Payer: BC Managed Care – PPO | Source: Ambulatory Visit | Attending: Obstetrics and Gynecology | Admitting: Obstetrics and Gynecology

## 2019-02-10 DIAGNOSIS — Z01812 Encounter for preprocedural laboratory examination: Secondary | ICD-10-CM | POA: Insufficient documentation

## 2019-02-10 DIAGNOSIS — D259 Leiomyoma of uterus, unspecified: Secondary | ICD-10-CM | POA: Insufficient documentation

## 2019-02-10 HISTORY — DX: Anemia, unspecified: D64.9

## 2019-02-10 LAB — CBC
HCT: 27.3 % — ABNORMAL LOW (ref 36.0–46.0)
Hemoglobin: 6.9 g/dL — CL (ref 12.0–15.0)
MCH: 14.2 pg — ABNORMAL LOW (ref 26.0–34.0)
MCHC: 25.3 g/dL — ABNORMAL LOW (ref 30.0–36.0)
MCV: 56.2 fL — ABNORMAL LOW (ref 80.0–100.0)
Platelets: 328 10*3/uL (ref 150–400)
RBC: 4.86 MIL/uL (ref 3.87–5.11)
RDW: 23.8 % — ABNORMAL HIGH (ref 11.5–15.5)
WBC: 6.2 10*3/uL (ref 4.0–10.5)
nRBC: 0 % (ref 0.0–0.2)

## 2019-02-10 NOTE — Progress Notes (Signed)
CRITICAL VALUE ALERT  Critical Value:  Hgb 6.9  Date & Time Notied:     02/10/19 17:45 call   Provider Notified:  Almon Hercules . Left message at 17:50,. Konrad Felix PA. Left message at 17:59  Orders Received/Actions taken:   Dr. Brock Ra called back at 18:00 and will call the Pt and cancel surgery

## 2019-02-11 ENCOUNTER — Emergency Department (HOSPITAL_COMMUNITY)
Admission: EM | Admit: 2019-02-11 | Discharge: 2019-02-11 | Disposition: A | Discharge status: Home / Self Care | Payer: BC Managed Care – PPO | Attending: Emergency Medicine | Admitting: Emergency Medicine

## 2019-02-11 ENCOUNTER — Encounter (HOSPITAL_COMMUNITY): Payer: Self-pay

## 2019-02-11 ENCOUNTER — Inpatient Hospital Stay (HOSPITAL_COMMUNITY): Admission: RE | Admit: 2019-02-11 | Payer: BLUE CROSS/BLUE SHIELD | Source: Ambulatory Visit

## 2019-02-11 ENCOUNTER — Other Ambulatory Visit: Payer: Self-pay

## 2019-02-11 DIAGNOSIS — Z03818 Encounter for observation for suspected exposure to other biological agents ruled out: Secondary | ICD-10-CM | POA: Diagnosis not present

## 2019-02-11 DIAGNOSIS — Z20828 Contact with and (suspected) exposure to other viral communicable diseases: Secondary | ICD-10-CM | POA: Diagnosis not present

## 2019-02-11 DIAGNOSIS — Z01812 Encounter for preprocedural laboratory examination: Secondary | ICD-10-CM | POA: Diagnosis not present

## 2019-02-11 DIAGNOSIS — N189 Chronic kidney disease, unspecified: Secondary | ICD-10-CM | POA: Diagnosis not present

## 2019-02-11 DIAGNOSIS — Z20822 Contact with and (suspected) exposure to covid-19: Secondary | ICD-10-CM

## 2019-02-11 LAB — SARS CORONAVIRUS 2 (TAT 6-24 HRS): SARS Coronavirus 2: NEGATIVE

## 2019-02-11 NOTE — ED Triage Notes (Signed)
Pt here to have COVID testing pre op for thyroid removal surgery on Wednesday. Denies any symptoms.

## 2019-02-11 NOTE — ED Provider Notes (Signed)
[facility] EMERGENCY DEPARTMENT Provider Note   CSN: [id number] Arrival date & time: 02/11/19  1120     History   Chief Complaint Chief Complaint  Patient presents with  . COVID testing (pre op)    HPI Sherry Blair is a 48 y.o. female.     HPI   48 year old female with a history of anemia, CKD, who presents to the emergency department today requesting COVID testing.  States that she is getting her thyroid out on 02/15/2019 and was told that she needed COVID testing prior to surgery.  She is asymptomatic.  Denies fevers, upper respiratory symptoms, chest pain, shortness of breath, nausea vomiting diarrhea or abdominal pain.  Past Medical History:  Diagnosis Date  . Anemia    low iron and fibroids  . Chronic kidney disease 09/2018   Acute kidney failure  . History of ectopic pregnancy 07/20/2009  . Infertility, female   . Leiomyoma of uterus     Patient Active Problem List   Diagnosis Date Noted  . 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 Symptomatic anemia 12/04/2015  . Abnormal uterine bleeding (AUB) 02/07/2014  . Dyspnea on exertion 04/06/2013  . Chest pain 04/06/2013  . Iron deficiency anemia 04/06/2013  . Thrombocytosis ([place]) 04/06/2013    Past Surgical History:  Procedure Laterality Date  . DX LAPAROSCOPY/  EXPLORATORY LAPARTOMY/ RIGHT SALPINGECTOMY/ WEDGE RESECTION FO THE UTERINE CORNU ON THE RIGHT , ECTOPIC PREGNANCY  07-20-2009    dr Delsa Sale  [facility]  . GASTRIC BYPASS       OB History    Gravida  4   Para  2   Term  2   Preterm      AB  2   Living  2     SAB      TAB  1   Ectopic  1   Multiple      Live Births  2            Home Medications    Prior to Admission medications   Medication Sig Start Date End Date Taking? Authorizing Provider  azithromycin (ZITHROMAX) 250 MG tablet Take 1 tablet (250 mg total) by mouth daily. Take first 2 tablets together, then 1 every day until finished. Patient not  taking: Reported on 02/06/2019 09/13/18   Virgel Manifold, MD  azithromycin (ZITHROMAX) 250 MG tablet Take 1 tablet (250 mg total) by mouth daily. Take first 2 tablets together, then 1 every day until finished. Patient not taking: Reported on 02/06/2019 09/13/18   Virgel Manifold, MD  ferrous sulfate 325 (65 FE) MG tablet Take 1 tablet (325 mg total) by mouth 3 (three) times daily with meals. 02/17/18   [name] Bombard, MD  ibuprofen (ADVIL,MOTRIN) 800 MG tablet TAKE 1 TABLET BY MOUTH EVERY 8 HOURS START BEFORE PERIOD UNTIL PERIOD ENDS, MONTHLY Patient taking differently: Take 800 mg by mouth every 8 (eight) hours as needed for moderate pain. TAKE 1 TABLET BY MOUTH EVERY 8 HOURS START BEFORE PERIOD UNTIL PERIOD ENDS, MONTHLY 07/23/18   [name] Bombard, MD  metroNIDAZOLE (NUVESSA) 1.3 % GEL Place 1 Applicatorful vaginally at bedtime as needed. Patient not taking: Reported on 02/06/2019 11/18/17   [name] Bombard, MD    Family History Family History  Problem Relation Age of Onset  . Hyperlipidemia Mother   . Lupus Mother   . Hypertension Father   . Diabetes Father     Social History Social History  Tobacco Use  . Smoking status: Never Smoker  . Smokeless tobacco: Never Used  Substance Use Topics  . Alcohol use: No    Alcohol/week: 0.0 standard drinks  . Drug use: No     Allergies   Patient has no known allergies.   Review of Systems Review of Systems  Constitutional: Negative for fever.  Eyes: Negative for visual disturbance.  Respiratory: Negative for shortness of breath.   Cardiovascular: Negative for chest pain.  Gastrointestinal: Negative for abdominal pain.  Genitourinary: Negative for pelvic pain.  Musculoskeletal: Negative for back pain.     Physical Exam Updated Vital Signs BP 107/63 (BP Location: Right Arm)   Pulse 83   Temp 99.4 F (37.4 C) (Oral)   Resp 16   LMP 01/24/2019 (Approximate)   SpO2 98%   Physical Exam Vitals signs and nursing note reviewed.   Constitutional:      General: She is not in acute distress.    Appearance: She is well-developed.  HENT:     Head: Normocephalic and atraumatic.  Eyes:     Conjunctiva/sclera: Conjunctivae normal.  Neck:     Musculoskeletal: Neck supple.  Cardiovascular:     Rate and Rhythm: Normal rate and regular rhythm.  Pulmonary:     Effort: Pulmonary effort is normal.     Breath sounds: Normal breath sounds.  Musculoskeletal: Normal range of motion.  Skin:    General: Skin is warm and dry.  Neurological:     Mental Status: She is alert.      ED Treatments / Results  Labs (all labs ordered are listed, but only abnormal results are displayed) Labs Reviewed  SARS CORONAVIRUS 2    EKG None  Radiology No results found.  Procedures Procedures (including critical care time)  Medications Ordered in ED Medications - No data to display   Initial Impression / Assessment and Plan / ED Course  I have reviewed the triage vital signs and the nursing notes.  Pertinent labs & imaging results that were available during my care of the patient were reviewed by me and considered in my medical decision making (see chart for details).   Final Clinical Impressions(s) / ED Diagnoses   Final diagnoses:  Suspected Covid-19 Virus Infection   48 year old female with a history of anemia, CKD, who presents to the emergency department today requesting COVID testing.  States that she is getting her thyroid out on 02/15/2019 and was told that she needed COVID testing prior to surgery.  She is asymptomatic.  Denies fevers, upper respiratory symptoms, chest pain, shortness of breath, nausea vomiting diarrhea or abdominal pain.  [place] testing obtained.  Patient instructed on quarantine precautions.  ED Discharge Orders    None       Bishop Dublin 02/11/19 1253    Tegeler, Gwenyth Allegra, MD 02/11/19 [phone]

## 2019-02-11 NOTE — Discharge Instructions (Signed)
You will need to self quarantine until you receive the results of your covid test.   If positive you will need to continue to quarantine for 14 days.  Please return to the emergency department for any new or worsening symptoms.

## 2019-02-11 NOTE — ED Notes (Signed)
Patient Alert and oriented to baseline. Stable and ambulatory to baseline. Patient verbalized understanding of the discharge instructions.  Patient belongings were taken by the patient.   

## 2019-02-15 ENCOUNTER — Ambulatory Visit (HOSPITAL_BASED_OUTPATIENT_CLINIC_OR_DEPARTMENT_OTHER)
Admission: RE | Admit: 2019-02-15 | Payer: BC Managed Care – PPO | Source: Home / Self Care | Admitting: Obstetrics and Gynecology

## 2019-02-15 ENCOUNTER — Encounter (HOSPITAL_BASED_OUTPATIENT_CLINIC_OR_DEPARTMENT_OTHER): Admission: RE | Payer: Self-pay | Source: Home / Self Care

## 2019-02-15 LAB — TYPE AND SCREEN
ABO/RH(D): B POS
Antibody Screen: NEGATIVE

## 2019-02-15 SURGERY — LAPAROSCOPIC GELPORT ASSISTED MYOMECTOMY
Anesthesia: General

## 2019-03-29 ENCOUNTER — Other Ambulatory Visit: Payer: Self-pay | Admitting: Obstetrics

## 2019-05-27 ENCOUNTER — Other Ambulatory Visit: Payer: Self-pay | Admitting: Obstetrics

## 2019-05-27 DIAGNOSIS — D500 Iron deficiency anemia secondary to blood loss (chronic): Secondary | ICD-10-CM

## 2019-06-11 ENCOUNTER — Emergency Department (HOSPITAL_COMMUNITY)
Admission: EM | Admit: 2019-06-11 | Discharge: 2019-06-11 | Disposition: A | Discharge status: Home / Self Care | Payer: BC Managed Care – PPO | Attending: Emergency Medicine | Admitting: Emergency Medicine

## 2019-06-11 ENCOUNTER — Other Ambulatory Visit: Payer: Self-pay

## 2019-06-11 ENCOUNTER — Encounter (HOSPITAL_COMMUNITY): Payer: Self-pay | Admitting: Emergency Medicine

## 2019-06-11 DIAGNOSIS — N189 Chronic kidney disease, unspecified: Secondary | ICD-10-CM | POA: Insufficient documentation

## 2019-06-11 DIAGNOSIS — Z79899 Other long term (current) drug therapy: Secondary | ICD-10-CM | POA: Insufficient documentation

## 2019-06-11 DIAGNOSIS — R519 Headache, unspecified: Secondary | ICD-10-CM | POA: Diagnosis present

## 2019-06-11 DIAGNOSIS — G44209 Tension-type headache, unspecified, not intractable: Secondary | ICD-10-CM

## 2019-06-11 LAB — I-STAT BETA HCG BLOOD, ED (MC, WL, AP ONLY): I-stat hCG, quantitative: <5 m[IU]/mL (ref <5)

## 2019-06-11 MED ORDER — SODIUM CHLORIDE 0.9 % IV BOLUS
1000.0000 mL | Freq: Once | INTRAVENOUS | Status: AC
  Administered 2019-06-11: 19:00:00 1000 mL via INTRAVENOUS

## 2019-06-11 MED ORDER — METOCLOPRAMIDE HCL 5 MG/ML IJ SOLN
5.0000 mg | Freq: Once | INTRAMUSCULAR | Status: AC
  Administered 2019-06-11: 5 mg via INTRAVENOUS
  Filled 2019-06-11: qty 2

## 2019-06-11 MED ORDER — DIPHENHYDRAMINE HCL 50 MG/ML IJ SOLN
12.5000 mg | Freq: Once | INTRAMUSCULAR | Status: AC
  Administered 2019-06-11: 12.5 mg via INTRAVENOUS
  Filled 2019-06-11: qty 1

## 2019-06-11 MED ORDER — KETOROLAC TROMETHAMINE 30 MG/ML IJ SOLN
30.0000 mg | Freq: Once | INTRAMUSCULAR | Status: AC
  Administered 2019-06-11: 20:00:00 30 mg via INTRAVENOUS
  Filled 2019-06-11: qty 1

## 2019-06-11 NOTE — Discharge Instructions (Addendum)
You can take Tylenol and ibuprofen as needed to help with your headache. Return to the ED if you start to have worsening symptoms, sudden onset of worsening headache, blurry vision, vomiting, numbness in arms or legs, chest pain.

## 2019-06-11 NOTE — ED Triage Notes (Signed)
Pt c/o headaches for past 2 weeks. Not relieved with advil.

## 2019-06-11 NOTE — ED Provider Notes (Signed)
[place] DEPT Provider Note   CSN: [id number] Arrival date & time: 06/11/19  1645     History Chief Complaint  Patient presents with  . Headache    Sherry Blair is a 48 y.o. female with a past medical history of anemia presenting to the ED with a chief complaint of a headache.  Reports intermittent gradual onset of headaches for the past 2 weeks.  She cannot recall any inciting event that triggered her symptoms 2 weeks ago.  She describes it as a tension headache like a vice around her head.  For the past 2 weeks her symptoms improved with ibuprofen.  States that she took 1 dose of ibuprofen approximately 7 hours ago with no improvement in her headache today.  She denies any associated nausea, vomiting, vision changes, numbness in arms or legs, head injuries, fever, neck stiffness, thunderclap headache, family or personal history of aneurysms.  She does endorse sore throat for the past 2 days.  HPI     Past Medical History:  Diagnosis Date  . Anemia    low iron and fibroids  . Chronic kidney disease 09/2018   Acute kidney failure  . History of ectopic pregnancy 07/20/2009  . Infertility, female   . Leiomyoma of uterus     Patient Active Problem List   Diagnosis Date Noted  . 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 Symptomatic anemia 12/04/2015  . Abnormal uterine bleeding (AUB) 02/07/2014  . Dyspnea on exertion 04/06/2013  . Chest pain 04/06/2013  . Iron deficiency anemia 04/06/2013  . Thrombocytosis ([place]) 04/06/2013    Past Surgical History:  Procedure Laterality Date  . DX LAPAROSCOPY/  EXPLORATORY LAPARTOMY/ RIGHT SALPINGECTOMY/ WEDGE RESECTION FO THE UTERINE CORNU ON THE RIGHT , ECTOPIC PREGNANCY  07-20-2009    dr Delsa Sale  [facility]  . GASTRIC BYPASS       OB History    Gravida  4   Para  2   Term  2   Preterm      AB  2   Living  2     SAB      TAB  1   Ectopic  1   Multiple      Live Births  2           Family History  Problem Relation Age of Onset  . Hyperlipidemia Mother   . Lupus Mother   . Hypertension Father   . Diabetes Father     Social History   Tobacco Use  . Smoking status: Never Smoker  . Smokeless tobacco: Never Used  Substance Use Topics  . Alcohol use: No    Alcohol/week: 0.0 standard drinks  . Drug use: No    Home Medications Prior to Admission medications   Medication Sig Start Date End Date Taking? Authorizing Provider  azithromycin (ZITHROMAX) 250 MG tablet Take 1 tablet (250 mg total) by mouth daily. Take first 2 tablets together, then 1 every day until finished. Patient not taking: Reported on 02/06/2019 09/13/18   Virgel Manifold, MD  azithromycin (ZITHROMAX) 250 MG tablet Take 1 tablet (250 mg total) by mouth daily. Take first 2 tablets together, then 1 every day until finished. Patient not taking: Reported on 02/06/2019 09/13/18   Virgel Manifold, MD  cefUROXime (CEFTIN) 500 MG tablet TAKE 1 TABLET (500 MG TOTAL) BY MOUTH 2 (TWO) TIMES DAILY WITH A MEAL. 03/30/19   [name] Bombard, MD  ferrous sulfate 325 (65 FE) MG  tablet TAKE 1 TABLET BY MOUTH 3 TIMES A DAY WITH MEALS 05/28/19   [name] Bombard, MD  ibuprofen (ADVIL,MOTRIN) 800 MG tablet TAKE 1 TABLET BY MOUTH EVERY 8 HOURS START BEFORE PERIOD UNTIL PERIOD ENDS, MONTHLY Patient taking differently: Take 800 mg by mouth every 8 (eight) hours as needed for moderate pain. TAKE 1 TABLET BY MOUTH EVERY 8 HOURS START BEFORE PERIOD UNTIL PERIOD ENDS, MONTHLY 07/23/18   [name] Bombard, MD  metroNIDAZOLE (NUVESSA) 1.3 % GEL Place 1 Applicatorful vaginally at bedtime as needed. Patient not taking: Reported on 02/06/2019 11/18/17   [name] Bombard, MD    Allergies    Patient has no known allergies.  Review of Systems   Review of Systems  Constitutional: Negative for appetite change, chills and fever.  HENT: Negative for ear pain, rhinorrhea, sneezing and sore throat.   Eyes: Negative for photophobia and  visual disturbance.  Respiratory: Negative for cough, chest tightness, shortness of breath and wheezing.   Cardiovascular: Negative for chest pain and palpitations.  Gastrointestinal: Negative for abdominal pain, blood in stool, constipation, diarrhea, nausea and vomiting.  Genitourinary: Negative for dysuria, hematuria and urgency.  Musculoskeletal: Negative for myalgias.  Skin: Negative for rash.  Neurological: Positive for headaches. Negative for dizziness, weakness and light-headedness.    Physical Exam Updated Vital Signs BP 129/68   Pulse 72   Temp 99.4 F (37.4 C) (Oral)   Resp 16   LMP 05/28/2019   SpO2 100%   Physical Exam Vitals and nursing note reviewed.  Constitutional:      General: She is not in acute distress.    Appearance: She is well-developed.  HENT:     Head: Normocephalic and atraumatic.     Comments: Patient does not appear to be in acute distress. No trismus or drooling present. No pooling of secretions. Patient is tolerating secretions and is not in respiratory distress. No neck pain or tenderness to palpation of the neck. Full active and passive range of motion of the neck. No evidence of RPA or PTA.    Nose: Nose normal.  Eyes:     General: No scleral icterus.       Right eye: No discharge.        Left eye: No discharge.     Conjunctiva/sclera: Conjunctivae normal.     Pupils: Pupils are equal, round, and reactive to light.  Neck:     Comments: No meningismus. Cardiovascular:     Rate and Rhythm: Normal rate and regular rhythm.     Heart sounds: Normal heart sounds. No murmur. No friction rub. No gallop.   Pulmonary:     Effort: Pulmonary effort is normal. No respiratory distress.     Breath sounds: Normal breath sounds.  Abdominal:     General: Bowel sounds are normal. There is no distension.     Palpations: Abdomen is soft.     Tenderness: There is no abdominal tenderness. There is no guarding.  Musculoskeletal:        General: Normal range  of motion.     Cervical back: Normal range of motion and neck supple. No rigidity.  Skin:    General: Skin is warm and dry.     Findings: No rash.  Neurological:     General: No focal deficit present.     Mental Status: She is alert and oriented to person, place, and time.     Cranial Nerves: No cranial nerve deficit.     Sensory:  No sensory deficit.     Motor: No weakness or abnormal muscle tone.     Coordination: Coordination normal.     Comments: Pupils reactive. No facial asymmetry noted. Cranial nerves appear grossly intact. Sensation intact to light touch on face, BUE and BLE. Strength 5/5 in BUE and BLE.      ED Results / Procedures / Treatments   Labs (all labs ordered are listed, but only abnormal results are displayed) Labs Reviewed  I-STAT BETA HCG BLOOD, ED ([facility], [facility], [facility] ONLY)    EKG None  Radiology No results found.  Procedures Procedures (including critical care time)  Medications Ordered in ED Medications  sodium chloride 0.9 % bolus 1,000 mL (0 mLs Intravenous Stopped 06/11/19 1957)  metoCLOPramide (REGLAN) injection 5 mg (5 mg Intravenous Given 06/11/19 1849)  diphenhydrAMINE (BENADRYL) injection 12.5 mg (12.5 mg Intravenous Given 06/11/19 1849)  ketorolac (TORADOL) 30 MG/ML injection 30 mg (30 mg Intravenous Given 06/11/19 1934)    ED Course  I have reviewed the triage vital signs and the nursing notes.  Pertinent labs & imaging results that were available during my care of the patient were reviewed by me and considered in my medical decision making (see chart for details).  Clinical Course as of Jun 10 2020  Sun Jun 11, 2019  1928 Mild improvement noted with migraine cocktail and IV fluids.  Will give Toradol.   [HK]    Clinical Course User Index [HK] Delia Heady, PA-C   MDM Rules/Calculators/A&P                      48 year old female presents to ED for headache.  Headache has been intermittent for the past 2 weeks and usually improved with  ibuprofen but not improved today.  Also reports sore throat since yesterday.  Bilaterally, symmetrically minimally enlarged tonsils without exudates.  No deficits neurological exam noted.  No meningismus.  Her vital signs are reassuring here.  Denies personal or family history of aneurysms.  Will give migraine cocktail, IV fluids and reassess.  8:17 PM Patient with improvement in her medications with migraine cocktail and Toradol.  She is resting comfortably. There are no headache characteristics that are lateralizing or concerning for increased ICP, infectious or vascular cause of her symptoms.  Feel that this could be related to what appears to be viral pharyngitis. Will have her continue Tylenol, ibuprofen at home and follow-up with PCP.  Patient is hemodynamically stable, in NAD, and able to ambulate in the ED. Evaluation does not show pathology that would require ongoing emergent intervention or inpatient treatment. I explained the diagnosis to the patient. Pain has been managed and has no complaints prior to discharge. Patient is comfortable with above plan and is stable for discharge at this time. All questions were answered prior to disposition. Strict return precautions for returning to the ED were discussed. Encouraged follow up with PCP.   An After Visit Summary was printed and given to the patient.   Portions of this note were generated with [employer]. Dictation errors may occur despite best attempts at proofreading.  Final Clinical Impression(s) / ED Diagnoses Final diagnoses:  Acute non intractable tension-type headache    Rx / DC Orders ED Discharge Orders    None       Delia Heady, PA-C 06/11/19 2021    Charlesetta Shanks, MD 06/14/19 1551

## 2019-06-14 DIAGNOSIS — Z20828 Contact with and (suspected) exposure to other viral communicable diseases: Secondary | ICD-10-CM | POA: Diagnosis not present

## 2019-09-07 ENCOUNTER — Ambulatory Visit (INDEPENDENT_AMBULATORY_CARE_PROVIDER_SITE_OTHER): Payer: PRIVATE HEALTH INSURANCE | Admitting: Obstetrics and Gynecology

## 2019-09-07 ENCOUNTER — Other Ambulatory Visit: Payer: Self-pay

## 2019-09-07 ENCOUNTER — Encounter: Payer: Self-pay | Admitting: Obstetrics and Gynecology

## 2019-09-07 ENCOUNTER — Other Ambulatory Visit (HOSPITAL_COMMUNITY)
Admission: RE | Admit: 2019-09-07 | Discharge: 2019-09-07 | Disposition: A | Discharge status: Home / Self Care | Payer: PRIVATE HEALTH INSURANCE | Source: Ambulatory Visit | Attending: Obstetrics and Gynecology | Admitting: Obstetrics and Gynecology

## 2019-09-07 VITALS — BP 115/72 | HR 73 | Ht 63.0 in | Wt 202.8 lb

## 2019-09-07 DIAGNOSIS — D500 Iron deficiency anemia secondary to blood loss (chronic): Secondary | ICD-10-CM | POA: Diagnosis not present

## 2019-09-07 DIAGNOSIS — D250 Submucous leiomyoma of uterus: Secondary | ICD-10-CM

## 2019-09-07 DIAGNOSIS — D251 Intramural leiomyoma of uterus: Secondary | ICD-10-CM

## 2019-09-07 DIAGNOSIS — Z01419 Encounter for gynecological examination (general) (routine) without abnormal findings: Secondary | ICD-10-CM

## 2019-09-07 MED ORDER — TRANEXAMIC ACID 650 MG PO TABS: 1300.0000 mg | ORAL_TABLET | Freq: Three times a day (TID) | ORAL | 2 refills | Status: DC

## 2019-09-07 NOTE — Patient Instructions (Signed)
Tranexamic acid oral tablets What is this medicine? TRANEXAMIC ACID (TRAN ex AM ik AS id) slows down or stops blood clots from being broken down. This medicine is used to treat heavy monthly menstrual bleeding. This medicine may be used for other purposes; ask your health care provider or pharmacist if you have questions. COMMON BRAND NAME(S): Cyklokapron, Lysteda What should I tell my health care provider before I take this medicine? They need to know if you have any of these conditions:  bleeding in the brain  blood clotting problems  kidney disease  vision problems  an unusual allergic reaction to tranexamic acid, other medicines, foods, dyes, or preservatives  pregnant or trying to get pregnant  breast-feeding How should I use this medicine? Take this medicine by mouth with a glass of water. Follow the directions on the prescription label. Do not cut, crush, or chew this medicine. You can take it with or without food. If it upsets your stomach, take it with food. Take your medicine at regular intervals. Do not take it more often than directed. Do not stop taking except on your doctor's advice. Do not take this medicine until your period has started. Do not take it for more than 5 days in a row. Do not take this medicine when you do not have your period. Talk to your pediatrician regarding the use of this medicine in children. While this drug may be prescribed for female children as young as 71 years of age for selected conditions, precautions do apply. Overdosage: If you think you have taken too much of this medicine contact a poison control center or emergency room at once. NOTE: This medicine is only for you. Do not share this medicine with others. What if I miss a dose? If you miss a dose, take it when you remember, and then take your next dose at least 6 hours later. Do not take more than 2 tablets at a time to make up for missed doses. What may interact with this medicine? Do  not take this medicine with any of the following medications:  estrogens  birth control pills, patches, injections, rings or other devices that contain both an estrogen and a progestin This medicine may also interact with the following medications:  certain medicines used to help your blood clot  tretinoin (taken by mouth) This list may not describe all possible interactions. Give your health care provider a list of all the medicines, herbs, non-prescription drugs, or dietary supplements you use. Also tell them if you smoke, drink alcohol, or use illegal drugs. Some items may interact with your medicine. What should I watch for while using this medicine? Tell your doctor or healthcare professional if your symptoms do not start to get better or if they get worse. Tell your doctor or healthcare professional if you notice any eye problems while taking this medicine. Your doctor will refer you to an eye doctor who will examine your eyes. What side effects may I notice from receiving this medicine? Side effects that you should report to your doctor or health care professional as soon as possible:  allergic reactions like skin rash, itching or hives, swelling of the face, lips, or tongue  breathing difficulties  changes in vision  sudden or severe pain in the chest, legs, head, or groin  unusually weak or tired Side effects that usually do not require medical attention (report to your doctor or health care professional if they continue or are bothersome):  back pain  headache  muscle or joint aches  sinus and nasal problems  stomach pain  tiredness This list may not describe all possible side effects. Call your doctor for medical advice about side effects. You may report side effects to FDA at 1-800-FDA-1088. Where should I keep my medicine? Keep out of the reach of children. Store at room temperature between 15 and 30 degrees C (59 and 86 degrees F). Throw away any unused  medicine after the expiration date. NOTE: This sheet is a summary. It may not cover all possible information. If you have questions about this medicine, talk to your doctor, pharmacist, or health care provider.  2020 Elsevier/Gold Standard (2015-07-11 09:12:15)

## 2019-09-07 NOTE — Progress Notes (Signed)
[facility] Visit  @DATE @            Patient name: Sherry Blair MRN [id number]  Date of birth: 08-02-1970  CC & HPI:  Sherry Blair is a 49 y.o. female presenting today for restart of fertility evaluation, she is a gravida 2 para 2 ages 63 and 59, who wants to consider having another child and wants myomectomies.  She was previously scheduled with Dr. Kerin Perna last year but the surgery was canceled due to anemia periods are described as heavy with clots.  We will check hemoglobin today she has been on no medications at present to control the periods such as tranexamic acid TXA, Hemoglobin record was shows hemoglobin 6.9 in August of last year Since Ms. Ruffolo would obviously need fertility evaluation I will refer her to a fertility team that her insurance covers if that can be located.  Dr. Kerin Perna is no longer covered by her insurance  ROS:  ROS Physically active notices some pain with lower abdominal pressure particular with her periods.  She has clots with her periods which last 1 week.  3 days are heavy with clots in 3 or 4 days are considered light.   Pertinent History Reviewed:   Reviewed: Significant for good overall health Medical         Past Medical History:  Diagnosis Date  . Anemia    low iron and fibroids  . Chronic kidney disease 09/2018   Acute kidney failure  . History of ectopic pregnancy 07/20/2009  . Infertility, female   . Leiomyoma of uterus                               Surgical Hx:    Past Surgical History:  Procedure Laterality Date  . DX LAPAROSCOPY/  EXPLORATORY LAPARTOMY/ RIGHT SALPINGECTOMY/ WEDGE RESECTION FO THE UTERINE CORNU ON THE RIGHT , ECTOPIC PREGNANCY  07-20-2009    dr Delsa Sale  [facility]  . GASTRIC BYPASS     Medications: Reviewed & Updated - see associated section                       Current Outpatient Medications:  .  Biotin 1 MG CAPS, Take by mouth., Disp: , Rfl:  .  Cholecalciferol (VITAMIN D) 50 MCG (2000 UT)  CAPS, Take by mouth., Disp: , Rfl:  .  ferrous sulfate 325 (65 FE) MG tablet, TAKE 1 TABLET BY MOUTH 3 TIMES A DAY WITH MEALS, Disp: 90 tablet, Rfl: 3 .  ibuprofen (ADVIL,MOTRIN) 800 MG tablet, TAKE 1 TABLET BY MOUTH EVERY 8 HOURS START BEFORE PERIOD UNTIL PERIOD ENDS, MONTHLY (Patient taking differently: Take 800 mg by mouth every 8 (eight) hours as needed for moderate pain. TAKE 1 TABLET BY MOUTH EVERY 8 HOURS START BEFORE PERIOD UNTIL PERIOD ENDS, MONTHLY), Disp: 30 tablet, Rfl: 8   Social History: Reviewed -  reports that she has never smoked. She has never used smokeless tobacco.  Objective Findings:  Vitals: Blood pressure 115/72, pulse 73, height 5\' 3"  (1.6 m), weight 202 lb 12.8 oz (92 kg), last menstrual period 08/25/2019.  PHYSICAL EXAMINATION General appearance - alert, well appearing, and in no distress, oriented to person, place, and time and normal appearing weight Mental status - alert, oriented to person, place, and time Chest - unlabored breath Heart -  Abdomen - soft, nontender, nondistended, no masses or organomegaly Fibroids felt to u-2  16 wk -18 wk size, mobile uterus Breasts -  Skin - normal coloration and turgor, no rashes, no suspicious skin lesions noted  PELVIC External genita lia - normal Vulva -good support Vagina -normal secretions generous fluid in the vaginal vault Cervix -multiparous  Uterus -enlarged mobile with fibroid uterus to 2 cm below the umbilicus Adnexa -fullness consistent with fibroid uterus [place] -  Rectal - rectal exam not indicated  CLINICAL DATA:  49 year old female with recent episode of 3 weeks of pelvic pain. History of right salpingectomy for ectopic pregnancy. LMP 08/29/2016.  EXAM: TRANSABDOMINAL AND TRANSVAGINAL ULTRASOUND OF PELVIS  TECHNIQUE: Both transabdominal and transvaginal ultrasound examinations of the pelvis were performed. Transabdominal technique was performed for global imaging of the pelvis including  uterus, ovaries, adnexal regions, and pelvic cul-de-sac. It was necessary to proceed with endovaginal exam following the transabdominal exam to visualize the endometrium, myometrium and adnexa.  COMPARISON:  07/20/2009 obstetric scan.  FINDINGS: Uterus  Measurements: 12.6 x 10.1 x 10.5 cm. Enlarged anteverted myomatous uterus, with fibroids as follows:  - right posterior uterine body intramural 7.2 x 6.1 x 7.8 cm fibroid with probable 30% submucosal component  - left uterine body intramural 3.4 x 3.4 x 2.9 cm fibroid  - right anterior uterine body subserosal 2.3 x 2.1 x 2.0 cm fibroid  Endometrium  Nondiagnostic evaluation of the endometrium due to significant mass-effect/distortion by the surrounding fibroids.  Right ovary  Measurements: 3.5 x 1.7 x 1.8 cm. Normal appearance/no adnexal mass.  Left ovary  Measurements: 3.1 x 2.6 x 3.5 cm. Normal appearance/no adnexal mass.  Other findings  No abnormal free fluid.  IMPRESSION: 1. Enlarged myomatous uterus as detailed. 2. Nondiagnostic evaluation of the endometrium due to significant mass effect/distortion by the surrounding fibroids. 3. Normal ovaries.  No adnexal masses.   Electronically Signed   By: Ilona Sorrel M.D.   On: 09/02/2016 11:26  Assessment & Plan:   A:  1. Fibroid uterus 16 to 18 weeks size 2. Desire for myomectomy 3. Desire for future fertility 4. History of recent anemia 5. Menorrhagia with clots as source of anemia  P:  1.   Check anemia panel 2.  Pelvic ultrasound ordered at [facility] after next menses Will give patient information about fertility options  3.  Once ultrasound report is received will reviewed with her 4.  Tranexamic acid 1300 mg 3 times daily during menses prescription written

## 2019-09-08 ENCOUNTER — Encounter: Payer: Self-pay | Admitting: *Deleted

## 2019-09-08 LAB — FOLATE: Folate: >20 ng/mL (ref >3.0)

## 2019-09-08 LAB — FERRITIN: Ferritin: 6 ng/mL — ABNORMAL LOW (ref 15–150)

## 2019-09-08 LAB — IRON AND TIBC
Iron Saturation: 4 % — CL (ref 15–55)
Iron: 17 ug/dL — ABNORMAL LOW (ref 27–159)
Total Iron Binding Capacity: 469 ug/dL — ABNORMAL HIGH (ref 250–450)
UIBC: 452 ug/dL — ABNORMAL HIGH (ref 131–425)

## 2019-09-08 LAB — VITAMIN B12: Vitamin B-12: 621 pg/mL (ref 232–1245)

## 2019-09-11 ENCOUNTER — Ambulatory Visit: Payer: BC Managed Care – PPO | Admitting: Obstetrics

## 2019-09-12 LAB — CYTOLOGY - PAP
Adequacy: ABSENT
Chlamydia: NEGATIVE
Comment: NEGATIVE
Comment: NEGATIVE
Comment: NORMAL
Diagnosis: NEGATIVE
High risk HPV: NEGATIVE
Neisseria Gonorrhea: NEGATIVE

## 2019-09-13 ENCOUNTER — Telehealth: Payer: Self-pay | Admitting: *Deleted

## 2019-09-13 NOTE — Telephone Encounter (Signed)
Pelvic US @ [facility] April 2 @ 1:30. Pt is to drink 32 oz of water 1 hour prior to her appt and go with a full bladder. Pt aware. [place]

## 2019-09-13 NOTE — Telephone Encounter (Signed)
Pt had OV last week. Was supposed to be set up for an [place] at [facility]. Patient hasn't heard anything back about that.

## 2019-09-22 ENCOUNTER — Ambulatory Visit (HOSPITAL_COMMUNITY)
Admission: RE | Admit: 2019-09-22 | Discharge: 2019-09-22 | Disposition: A | Discharge status: Home / Self Care | Payer: PRIVATE HEALTH INSURANCE | Source: Ambulatory Visit | Attending: Obstetrics and Gynecology | Admitting: Obstetrics and Gynecology

## 2019-09-22 ENCOUNTER — Other Ambulatory Visit: Payer: Self-pay

## 2019-09-22 DIAGNOSIS — D250 Submucous leiomyoma of uterus: Secondary | ICD-10-CM | POA: Diagnosis present

## 2019-09-22 DIAGNOSIS — D251 Intramural leiomyoma of uterus: Secondary | ICD-10-CM | POA: Diagnosis not present

## 2019-09-22 IMAGING — US US PELVIS COMPLETE WITH TRANSVAGINAL
2 series · 13 of 25 positions shown · non-contrast
Comparison: [DATE]

CLINICAL DATA: Fibroids



[Series 1: us pelvis complete with transvaginal · 10 of 25 slices shown (1 of 2)]
[im 1/25]
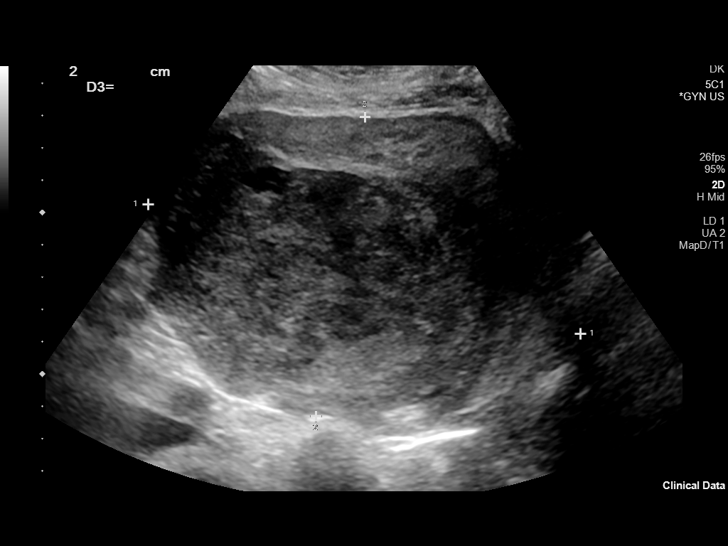
[im 3/25]
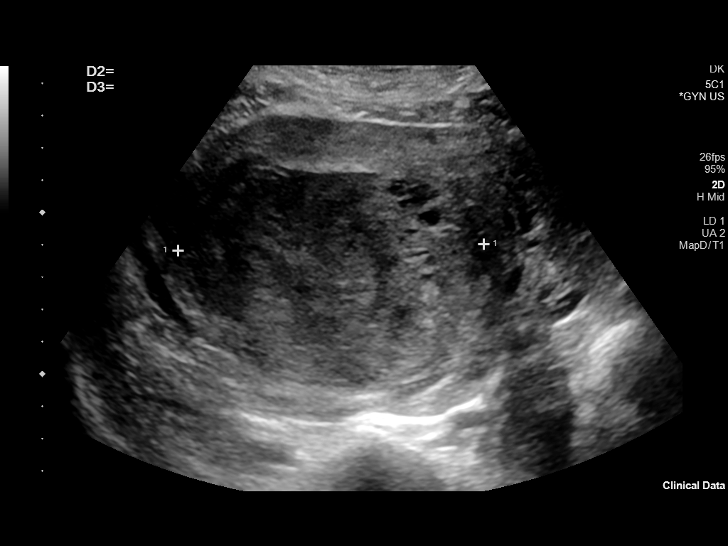
[im 6/25]
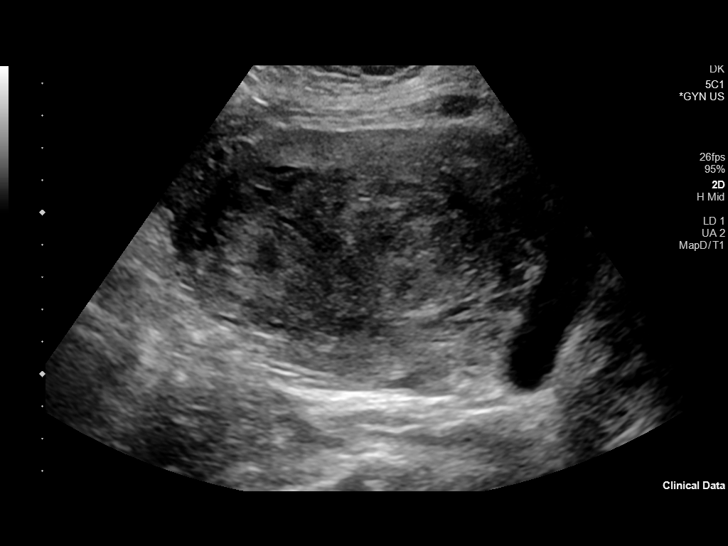
[im 8/25]
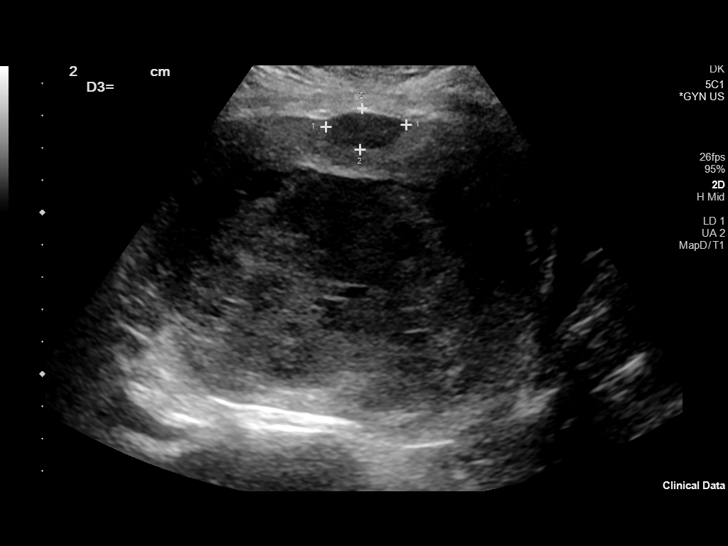
[im 11/25]
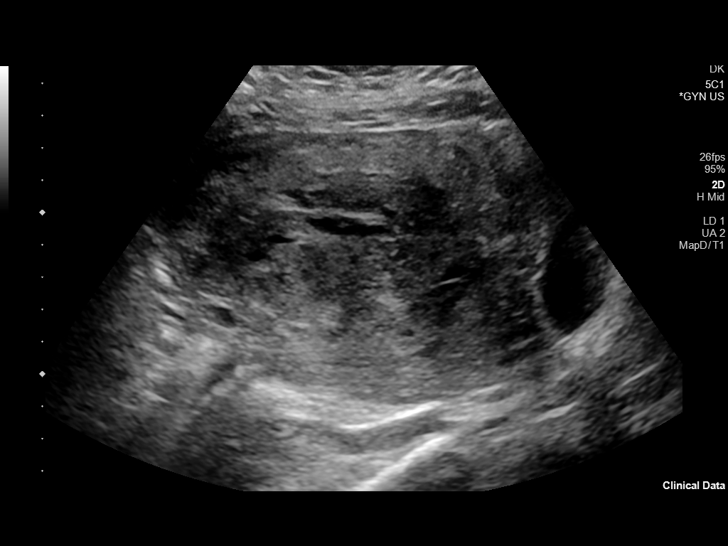
[im 13/25]
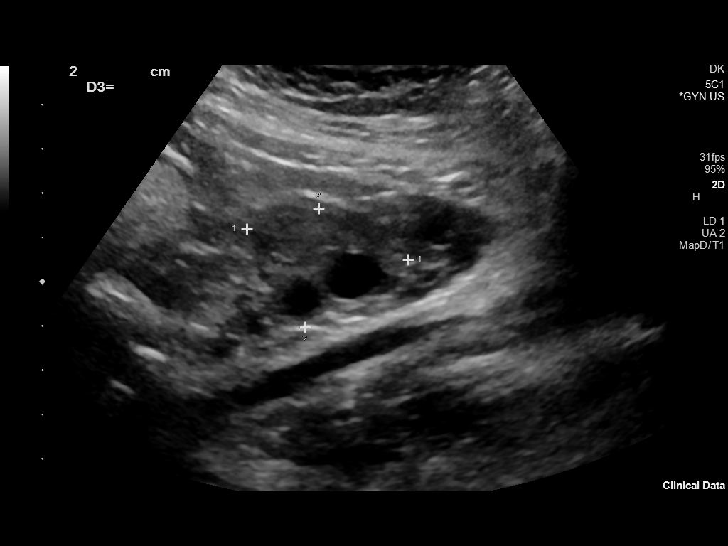
[im 16/25]
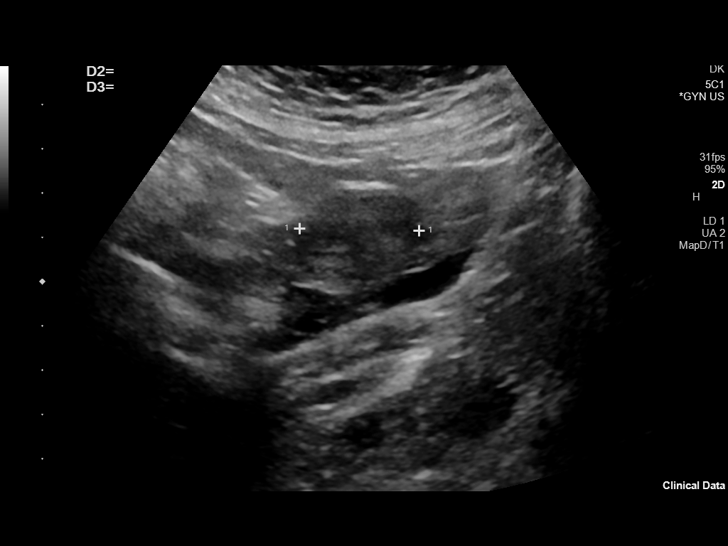
[im 18/25]
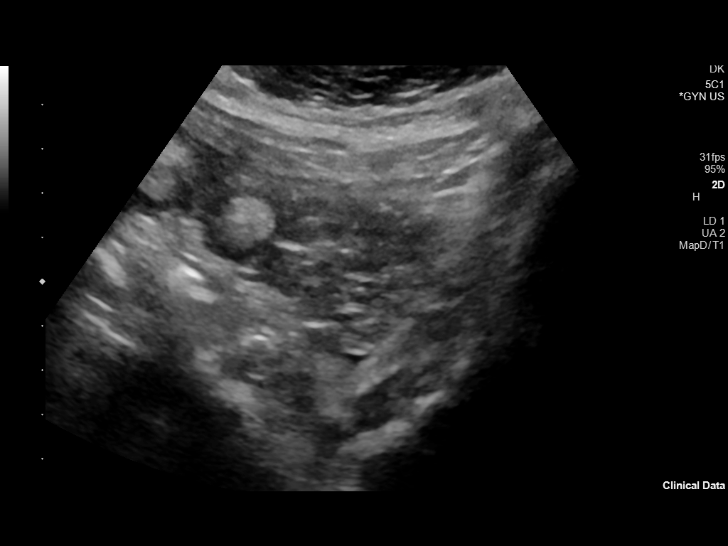
[im 21/25]
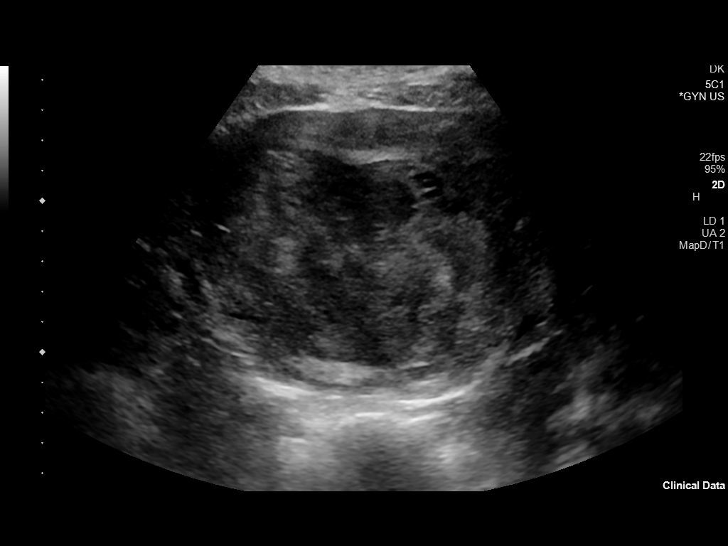
[im 23/25]
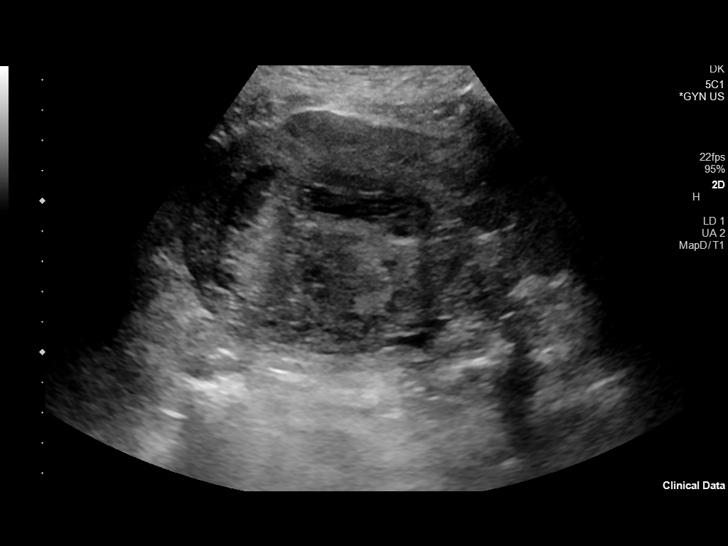

[Series 2: us pelvis complete with transvaginal · 3 of 7 slices shown (2 of 2)]
[im 1/7]
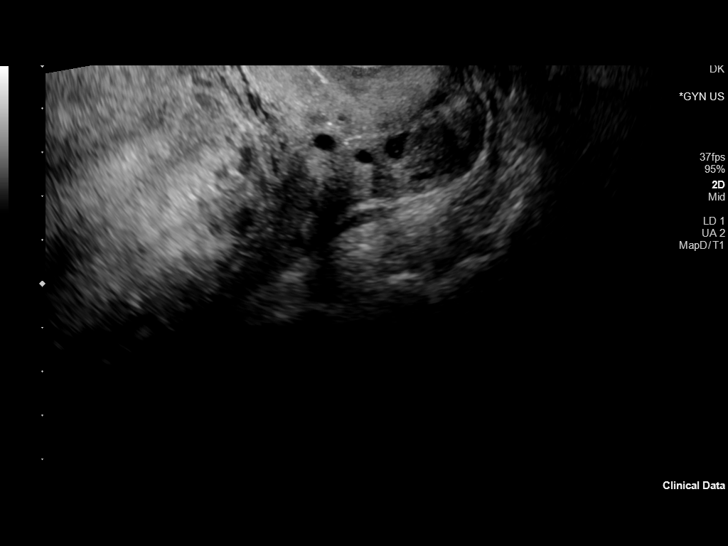
[im 4/7]
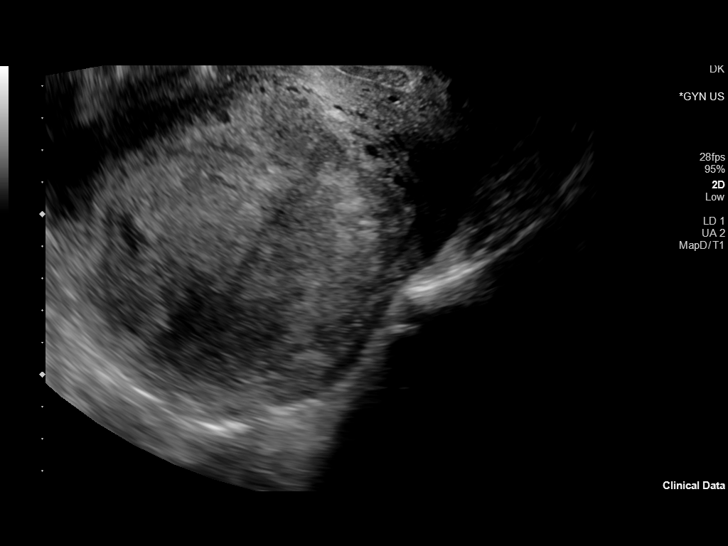
[im 7/7]
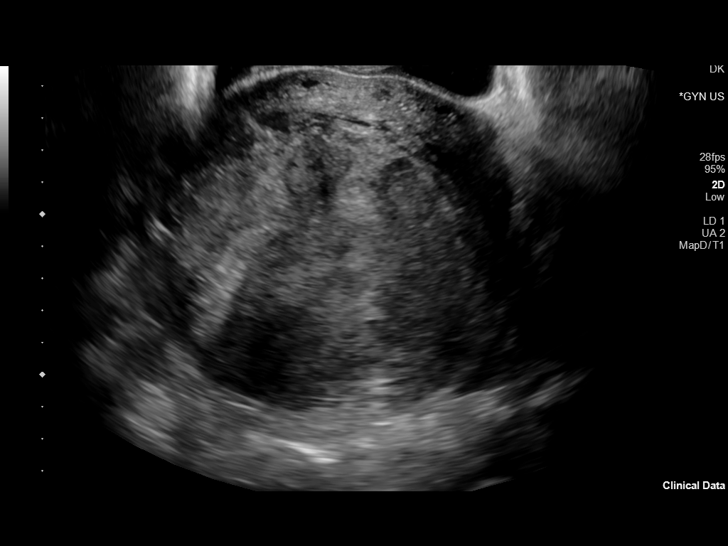

[13 of 25 positions shown; findings below may reference images not displayed]

FINDINGS: Uterus

Measurements: 14.0 x 9.4 x 12.3 cm = volume: 840 mL. Enlarged and
heterogeneous. Tiny submucosal leiomyoma anterior upper uterus 2.5 x
1.3 x 2.4 cm. Additional large posterior to central leiomyoma at the
mid upper uterus 10.2 x 9.4 x 9.8 cm, based on size and position
almost certainly submucosal.

Endometrium

Obscured due to presence of the large leiomyoma at the upper to mid
uterus

Right ovary

Surgically absent by history

Left ovary

Measurements: 3.7 x 2.7 x 2.7 cm = volume: 14 mL. Normal morphology
without mass

Other findings

No free pelvic fluid.  No adnexal masses.
IMPRESSION: Surgical absence of RIGHT ovary.

Enlarged uterus containing a large posterior to central leiomyoma at
the upper to mid uterus 10.2 cm in greatest size, obscuring the
endometrial complex.

## 2019-09-29 ENCOUNTER — Other Ambulatory Visit: Payer: Self-pay

## 2019-09-29 ENCOUNTER — Ambulatory Visit (INDEPENDENT_AMBULATORY_CARE_PROVIDER_SITE_OTHER): Payer: PRIVATE HEALTH INSURANCE | Admitting: Obstetrics and Gynecology

## 2019-09-29 ENCOUNTER — Encounter: Payer: Self-pay | Admitting: Obstetrics and Gynecology

## 2019-09-29 VITALS — BP 118/73 | HR 63

## 2019-09-29 DIAGNOSIS — D500 Iron deficiency anemia secondary to blood loss (chronic): Secondary | ICD-10-CM | POA: Diagnosis not present

## 2019-09-29 LAB — POCT HEMOGLOBIN: Hemoglobin: 7.7 g/dL — AB (ref 11–14.6)

## 2019-09-29 NOTE — Progress Notes (Signed)
Patient ID: Sherry Blair Reason, female   DOB: 1971-02-14, 49 y.o.   MRN: [id number]     [facility] Visit  @DATE @            Patient name: Sherry Blair MRN [id number]  Date of birth: [birth year]  CC & HPI:  Sherry Blair is a 49 y.o. female presenting today for follow-up after having transabdominal and transvaginal pelvic ultrasound on 09/22/2019 for fibroids. Results showed surgically absent right ovary in addition to enlarged uterus containing a large posterior to central leiomyoma at the upper to mid uterus, 10.2 cm in greatest size, obscuring the endometrial complex.  The patient is still interested in pursuing fertility treatment. Of note, her hemoglobin today was 7.7. She reports only passing one large clot during her last menstrual period. She has no other complaints at this time.  ROS:  ROS + Passing large clot during last menstrual cycle  Pertinent History Reviewed:   Reviewed Medical         Past Medical History:  Diagnosis Date  . Anemia    low iron and fibroids  . Chronic kidney disease 09/2018   Acute kidney failure  . History of ectopic pregnancy 07/20/2009  . Infertility, female   . Leiomyoma of uterus                               Surgical Hx:    Past Surgical History:  Procedure Laterality Date  . DX LAPAROSCOPY/  EXPLORATORY LAPARTOMY/ RIGHT SALPINGECTOMY/ WEDGE RESECTION FO THE UTERINE CORNU ON THE RIGHT , ECTOPIC PREGNANCY  07-20-2009    dr Delsa Sale  [facility]  . GASTRIC BYPASS     Medications: Reviewed & Updated - see associated section                       Current Outpatient Medications:  .  Biotin 1 MG CAPS, Take by mouth., Disp: , Rfl:  .  ferrous sulfate 325 (65 FE) MG tablet, TAKE 1 TABLET BY MOUTH 3 TIMES A DAY WITH MEALS, Disp: 90 tablet, Rfl: 3 .  ibuprofen (ADVIL,MOTRIN) 800 MG tablet, TAKE 1 TABLET BY MOUTH EVERY 8 HOURS START BEFORE PERIOD UNTIL PERIOD ENDS, MONTHLY (Patient taking differently: Take 800 mg by mouth every 8 (eight)  hours as needed for moderate pain. TAKE 1 TABLET BY MOUTH EVERY 8 HOURS START BEFORE PERIOD UNTIL PERIOD ENDS, MONTHLY), Disp: 30 tablet, Rfl: 8 .  Cholecalciferol (VITAMIN D) 50 MCG (2000 UT) CAPS, Take by mouth., Disp: , Rfl:  .  tranexamic acid (LYSTEDA) 650 MG TABS tablet, Take 2 tablets (1,300 mg total) by mouth 3 (three) times daily. To control heavy bleeding (Patient not taking: Reported on 09/29/2019), Disp: 30 tablet, Rfl: 2   Social History: Reviewed -  reports that she has never smoked. She has never used smokeless tobacco.  Objective Findings:  Vitals: Blood pressure 118/73, pulse 63, last menstrual period 09/24/2019.  PHYSICAL EXAMINATION General appearance - alert, well appearing, and in no distress Mental status - normal mood, behavior, speech, dress, motor activity, and thought processes, affect appropriate to mood   Assessment & Plan:   A:  1. Fibroid uterus 2. Desire for myomectomy 3. Desire for future fertility 4. History of recent anemia 5. Menorrhagia with clotting  P:  1. Discussed ultrasound results 2. Will refer for surgery. Will consider placing patient on Lupron 3. Referred patient to [facility]  for Reproductive [facility] and [facility] given names and information about each. 4. Hemoglobin today was 7.7. She is taking Tranexamic acid 1300 mg 3x daily with improvement. Plan for Feraheme infusion 5. Improved; only passed one large clot during her last menstrual cycle  By signing my name below, I, Clerance Lav, attest that this documentation has been prepared under the direction and in the presence of Jonnie Kind, MD. Electronically Signed: [place]. 09/29/19. 9:04 AM.  I personally performed the services described in this documentation, which was SCRIBED in my presence. The recorded information has been reviewed and considered accurate. It has been edited as necessary during review. Jonnie Kind, MD

## 2019-10-01 NOTE — Progress Notes (Signed)
Improved menses on Lysteda (tranexamic acid 1300 mg tid with menses. Still would benefit from IV iron  Pt investigating Reproductive Endocrinology optioons and will let us know of preference for referral

## 2019-12-10 ENCOUNTER — Other Ambulatory Visit: Payer: Self-pay | Admitting: Obstetrics

## 2019-12-10 DIAGNOSIS — N944 Primary dysmenorrhea: Secondary | ICD-10-CM

## 2019-12-11 DIAGNOSIS — D649 Anemia, unspecified: Secondary | ICD-10-CM | POA: Diagnosis not present

## 2019-12-11 DIAGNOSIS — N924 Excessive bleeding in the premenopausal period: Secondary | ICD-10-CM | POA: Diagnosis not present

## 2019-12-11 DIAGNOSIS — D251 Intramural leiomyoma of uterus: Secondary | ICD-10-CM | POA: Diagnosis not present

## 2019-12-11 DIAGNOSIS — N920 Excessive and frequent menstruation with regular cycle: Secondary | ICD-10-CM | POA: Diagnosis not present

## 2019-12-12 ENCOUNTER — Other Ambulatory Visit: Payer: Self-pay | Admitting: *Deleted

## 2019-12-12 DIAGNOSIS — N944 Primary dysmenorrhea: Secondary | ICD-10-CM

## 2019-12-12 MED ORDER — IBUPROFEN 800 MG PO TABS: ORAL_TABLET | ORAL | 2 refills | Status: DC

## 2019-12-31 DIAGNOSIS — Z20822 Contact with and (suspected) exposure to covid-19: Secondary | ICD-10-CM | POA: Diagnosis not present

## 2020-01-01 ENCOUNTER — Encounter: Payer: Self-pay | Admitting: Obstetrics

## 2020-01-01 ENCOUNTER — Other Ambulatory Visit: Payer: Self-pay

## 2020-01-01 ENCOUNTER — Ambulatory Visit (INDEPENDENT_AMBULATORY_CARE_PROVIDER_SITE_OTHER): Payer: BC Managed Care – PPO | Admitting: Obstetrics

## 2020-01-01 ENCOUNTER — Other Ambulatory Visit (HOSPITAL_COMMUNITY)
Admission: RE | Admit: 2020-01-01 | Discharge: 2020-01-01 | Disposition: A | Discharge status: Home / Self Care | Payer: BC Managed Care – PPO | Source: Ambulatory Visit | Attending: Obstetrics | Admitting: Obstetrics

## 2020-01-01 VITALS — BP 117/67 | HR 69 | Wt 201.8 lb

## 2020-01-01 DIAGNOSIS — Z1239 Encounter for other screening for malignant neoplasm of breast: Secondary | ICD-10-CM

## 2020-01-01 DIAGNOSIS — N393 Stress incontinence (female) (male): Secondary | ICD-10-CM | POA: Diagnosis not present

## 2020-01-01 DIAGNOSIS — D250 Submucous leiomyoma of uterus: Secondary | ICD-10-CM

## 2020-01-01 DIAGNOSIS — N898 Other specified noninflammatory disorders of vagina: Secondary | ICD-10-CM | POA: Diagnosis not present

## 2020-01-01 DIAGNOSIS — D251 Intramural leiomyoma of uterus: Secondary | ICD-10-CM

## 2020-01-01 NOTE — Progress Notes (Addendum)
Patient is in the office for GYN visit Last pap 09-07-19 at [facility] Pt states that she has not had a mammogram in a year Pt's mother has hx of breast cancer, pt declines hereditary testing. Pt reports vaginal discharge and odor.   Subjective:        Sherry Blair is a 49 y.o. female here for a complaint of vaginal discharge and leaking urine when she cough, sneezes and any abdominal pressure.  Sometimes has pools of fluid coming from vagina.  She has to wear a pad, and the leaking of urine makes socialization very difficult.  Personal health questionnaire:  Is patient [name], have a family history of breast and/or ovarian cancer: yes Is there a family history of uterine cancer diagnosed at age < 11, gastrointestinal cancer, urinary tract cancer, family member who is a Field seismologist syndrome-associated carrier: no Is the patient overweight and hypertensive, family history of diabetes, personal history of gestational diabetes, preeclampsia or PCOS: no Is patient over 58, have PCOS,  family history of premature CHD under age 45, diabetes, smoke, have hypertension or peripheral artery disease:  no At any time, has a partner hit, kicked or otherwise hurt or frightened you?: no Over the past 2 weeks, have you felt down, depressed or hopeless?: no Over the past 2 weeks, have you felt little interest or pleasure in doing things?:no   Gynecologic History Patient's last menstrual period was 12/15/2019. Contraception: none Last Pap: 09-07-2019. Results were: normal Last mammogram: 04-25-2015. Results were: normal  Obstetric History OB History  Gravida Para Term Preterm AB Living  4 2 2   2 2   SAB TAB Ectopic Multiple Live Births    1 1   2     # Outcome Date GA Lbr Len/2nd Weight Sex Delivery Anes PTL Lv  4 Ectopic 2011 [redacted]w[redacted]d       DEC  3 Term 09/19/97 [redacted]w[redacted]d  7 lb 8 oz (3.402 kg) M Vag-Spont EPI  LIV  2 Term 08/24/96 109w0d  7 lb 6 oz (3.345 kg) F Vag-Spont EPI  LIV  1 TAB         DEC     Past Medical History:  Diagnosis Date  . Anemia    low iron and fibroids  . Chronic kidney disease 09/2018   Acute kidney failure  . History of ectopic pregnancy 07/20/2009  . Infertility, female   . Leiomyoma of uterus     Past Surgical History:  Procedure Laterality Date  . DX LAPAROSCOPY/  EXPLORATORY LAPARTOMY/ RIGHT SALPINGECTOMY/ WEDGE RESECTION FO THE UTERINE CORNU ON THE RIGHT , ECTOPIC PREGNANCY  07-20-2009    dr Delsa Sale  [facility]  . GASTRIC BYPASS       Current Outpatient Medications:  .  Biotin 1 MG CAPS, Take by mouth., Disp: , Rfl:  .  Cholecalciferol (VITAMIN D) 50 MCG (2000 UT) CAPS, Take by mouth., Disp: , Rfl:  .  ferrous sulfate 325 (65 FE) MG tablet, TAKE 1 TABLET BY MOUTH 3 TIMES A DAY WITH MEALS, Disp: 90 tablet, Rfl: 3 .  ibuprofen (ADVIL) 800 MG tablet, TAKE 1 TABLET BY MOUTH EVERY 8 HOURS START BEFORE PERIOD UNTIL PERIOD ENDS, MONTHLY, Disp: 30 tablet, Rfl: 2 .  tranexamic acid (LYSTEDA) 650 MG TABS tablet, Take 2 tablets (1,300 mg total) by mouth 3 (three) times daily. To control heavy bleeding (Patient not taking: Reported on 09/29/2019), Disp: 30 tablet, Rfl: 2 No Known Allergies  Social History   Tobacco Use  .  Smoking status: Never Smoker  . Smokeless tobacco: Never Used  Substance Use Topics  . Alcohol use: Yes    Alcohol/week: 0.0 standard drinks    Comment: socially    Family History  Problem Relation Age of Onset  . Hyperlipidemia Mother   . Lupus Mother   . Breast cancer Mother   . Hypertension Father   . Diabetes Father   . Hypertension Brother       Review of Systems  Constitutional: negative for fatigue and weight loss Respiratory: negative for cough and wheezing Cardiovascular: negative for chest pain, fatigue and palpitations Gastrointestinal: negative for abdominal pain and change in bowel habits Musculoskeletal:negative for myalgias Neurological: negative for gait problems and tremors Behavioral/Psych: negative for  abusive relationship, depression Endocrine: negative for temperature intolerance    Genitourinary:negative for abnormal menstrual periods, genital lesions, hot flashes, sexual problems and vaginal discharge Integument/breast: negative for breast lump, breast tenderness, nipple discharge and skin lesion(s)    Objective:       BP 117/67   Pulse 69   Wt 201 lb 12.8 oz (91.5 kg)   LMP 12/15/2019   BMI 35.75 kg/m  General:   alert and no distress  Skin:   no rash or abnormalities  Lungs:   clear to auscultation bilaterally  Heart:   regular rate and rhythm, S1, S2 normal, no murmur, click, rub or gallop  Breasts:   normal without suspicious masses, skin or nipple changes or axillary nodes  Abdomen:  normal findings: no organomegaly, soft, non-tender and no hernia  Pelvis:  External genitalia: normal general appearance Urinary system: urethral meatus normal and bladder without fullness, nontender Vaginal: normal without tenderness, induration or masses Cervix: normal appearance Adnexa: normal bimanual exam Uterus: anteverted and non-tender, normal size   Lab Review Urine pregnancy test Labs reviewed yes Radiologic studies reviewed yes  50% of 20 min visit spent on counseling and coordination of care.   Assessment:     1. Vaginal discharge Rx: - Cervicovaginal ancillary only( [place])  2. Intramural and submucous leiomyoma of uterus  3. SUI (stress urinary incontinence, female) Rx: - Ambulatory referral to Urogynecology  4. Screening breast examination Rx: - MM Digital Screening; Future    Plan:    Education reviewed: calcium supplements, depression evaluation, low fat, low cholesterol diet, safe sex/STD prevention, self breast exams and weight bearing exercise. Mammogram ordered. Follow up in: 1 year.    Orders Placed This Encounter  Procedures  . MM Digital Screening    Standing Status:   Future    Standing Expiration Date:   12/31/2020    Order Specific  Question:   Reason for Exam (SYMPTOM  OR DIAGNOSIS REQUIRED)    Answer:   Screening    Order Specific Question:   Is the patient pregnant?    Answer:   No    Order Specific Question:   Preferred imaging location?    Answer:   [facility]  . Ambulatory referral to Urogynecology    Referral Priority:   Routine    Referral Type:   Consultation    Referral Reason:   Specialty Services Required    Requested Specialty:   Urology    Number of Visits Requested:   1    [name] Bombard, MD 01/01/2020 2:35 PM

## 2020-01-02 ENCOUNTER — Other Ambulatory Visit: Payer: Self-pay | Admitting: Obstetrics

## 2020-01-02 DIAGNOSIS — N760 Acute vaginitis: Secondary | ICD-10-CM

## 2020-01-02 DIAGNOSIS — B9689 Other specified bacterial agents as the cause of diseases classified elsewhere: Secondary | ICD-10-CM

## 2020-01-02 LAB — CERVICOVAGINAL ANCILLARY ONLY
Bacterial Vaginitis (gardnerella): POSITIVE — AB
Candida Glabrata: NEGATIVE
Candida Vaginitis: NEGATIVE
Chlamydia: NEGATIVE
Comment: NEGATIVE
Comment: NEGATIVE
Comment: NEGATIVE
Comment: NEGATIVE
Comment: NEGATIVE
Comment: NORMAL
Neisseria Gonorrhea: NEGATIVE
Trichomonas: NEGATIVE

## 2020-01-02 MED ORDER — METRONIDAZOLE 500 MG PO TABS: 500.0000 mg | ORAL_TABLET | Freq: Two times a day (BID) | ORAL | 2 refills | Status: DC

## 2020-01-03 ENCOUNTER — Other Ambulatory Visit: Payer: BC Managed Care – PPO

## 2020-01-23 ENCOUNTER — Ambulatory Visit
Admission: RE | Admit: 2020-01-23 | Discharge: 2020-01-23 | Disposition: A | Discharge status: Home / Self Care | Payer: BC Managed Care – PPO | Source: Ambulatory Visit | Attending: Obstetrics | Admitting: Obstetrics

## 2020-01-23 ENCOUNTER — Other Ambulatory Visit: Payer: Self-pay

## 2020-01-23 DIAGNOSIS — Z1239 Encounter for other screening for malignant neoplasm of breast: Secondary | ICD-10-CM

## 2020-01-23 DIAGNOSIS — Z1231 Encounter for screening mammogram for malignant neoplasm of breast: Secondary | ICD-10-CM | POA: Diagnosis not present

## 2020-01-23 IMAGING — MG DIGITAL SCREENING BILAT W/ CAD
4 series · 4 of 4 positions shown · non-contrast
Comparison: Previous exam(s).

CLINICAL DATA: Screening.

EXAM:
DIGITAL SCREENING BILATERAL MAMMOGRAM WITH CAD

[L MLO]
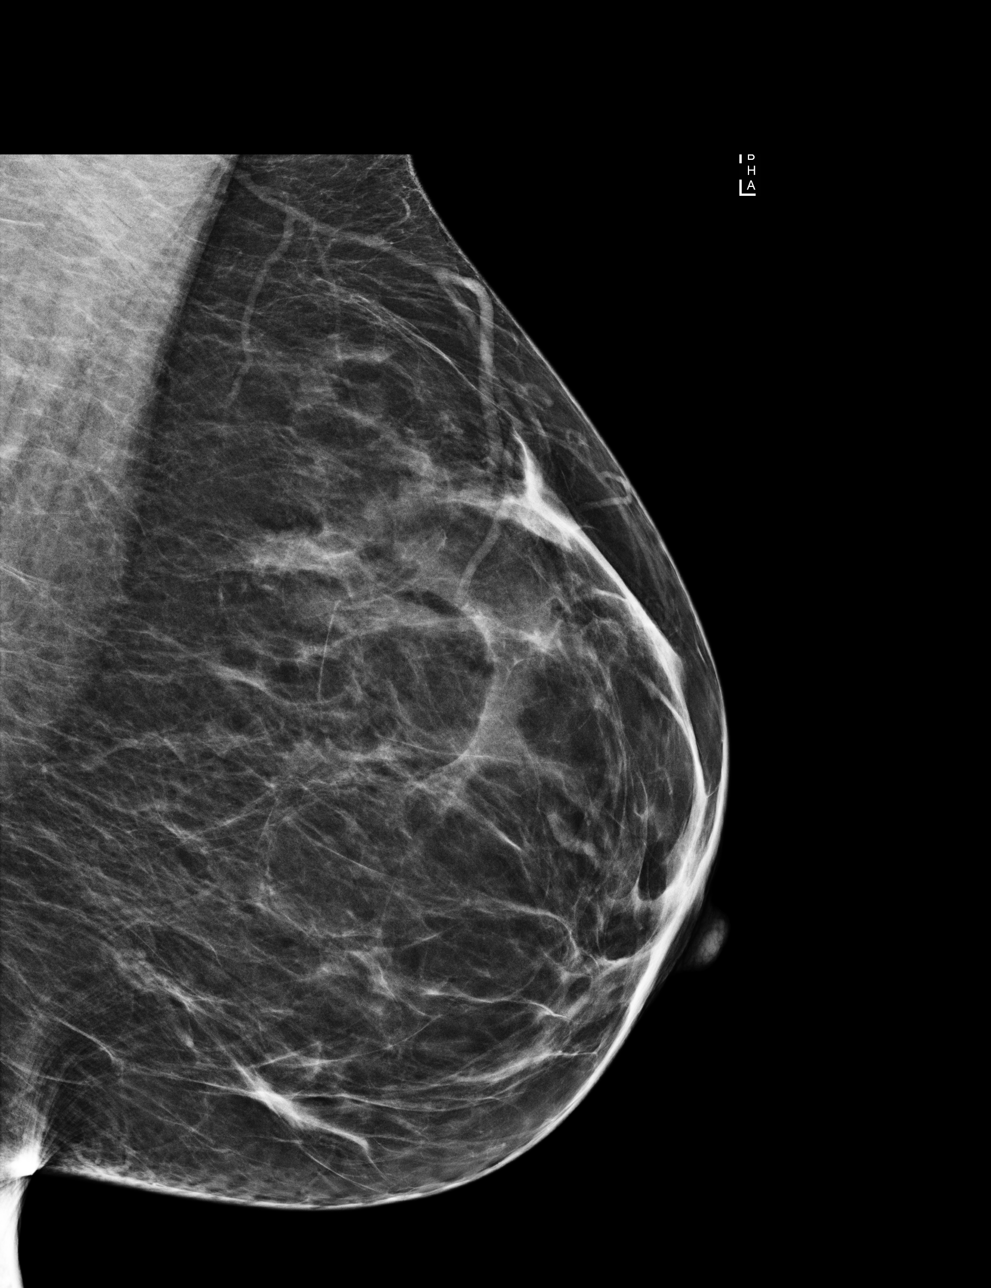

[R CC]
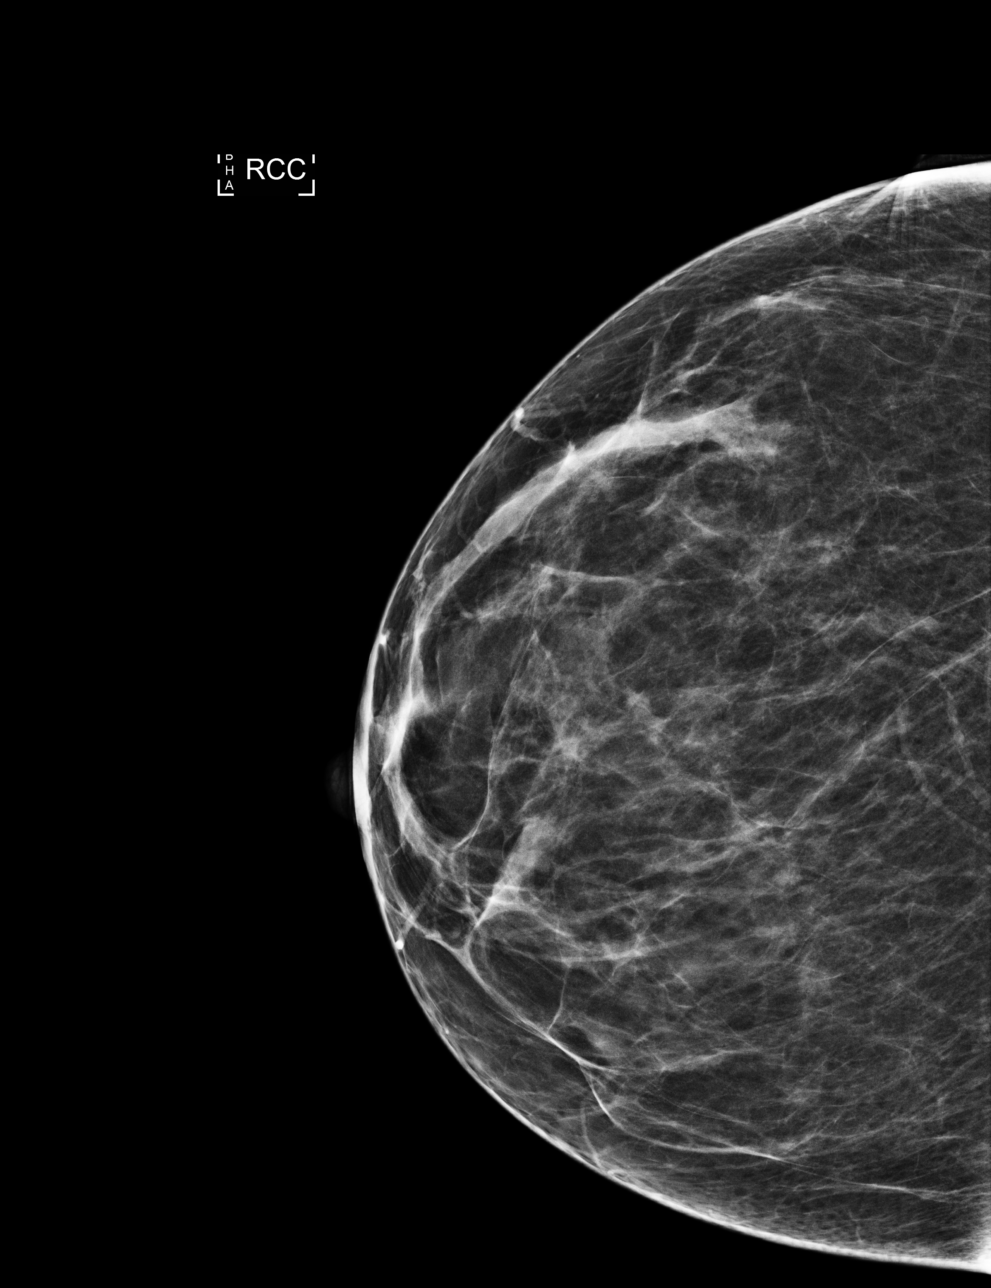

[R MLO]
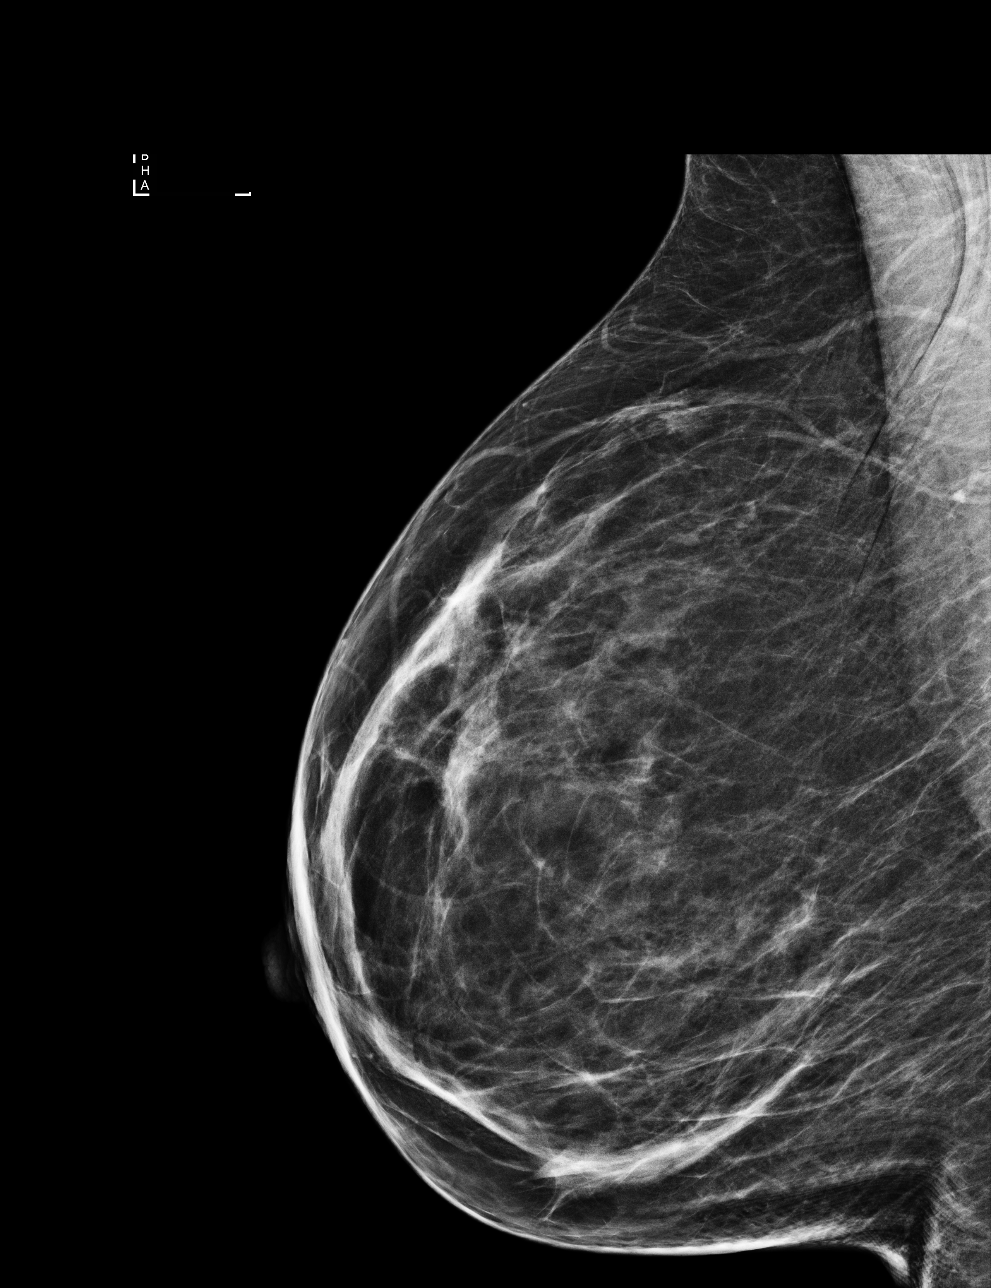

[L CC]
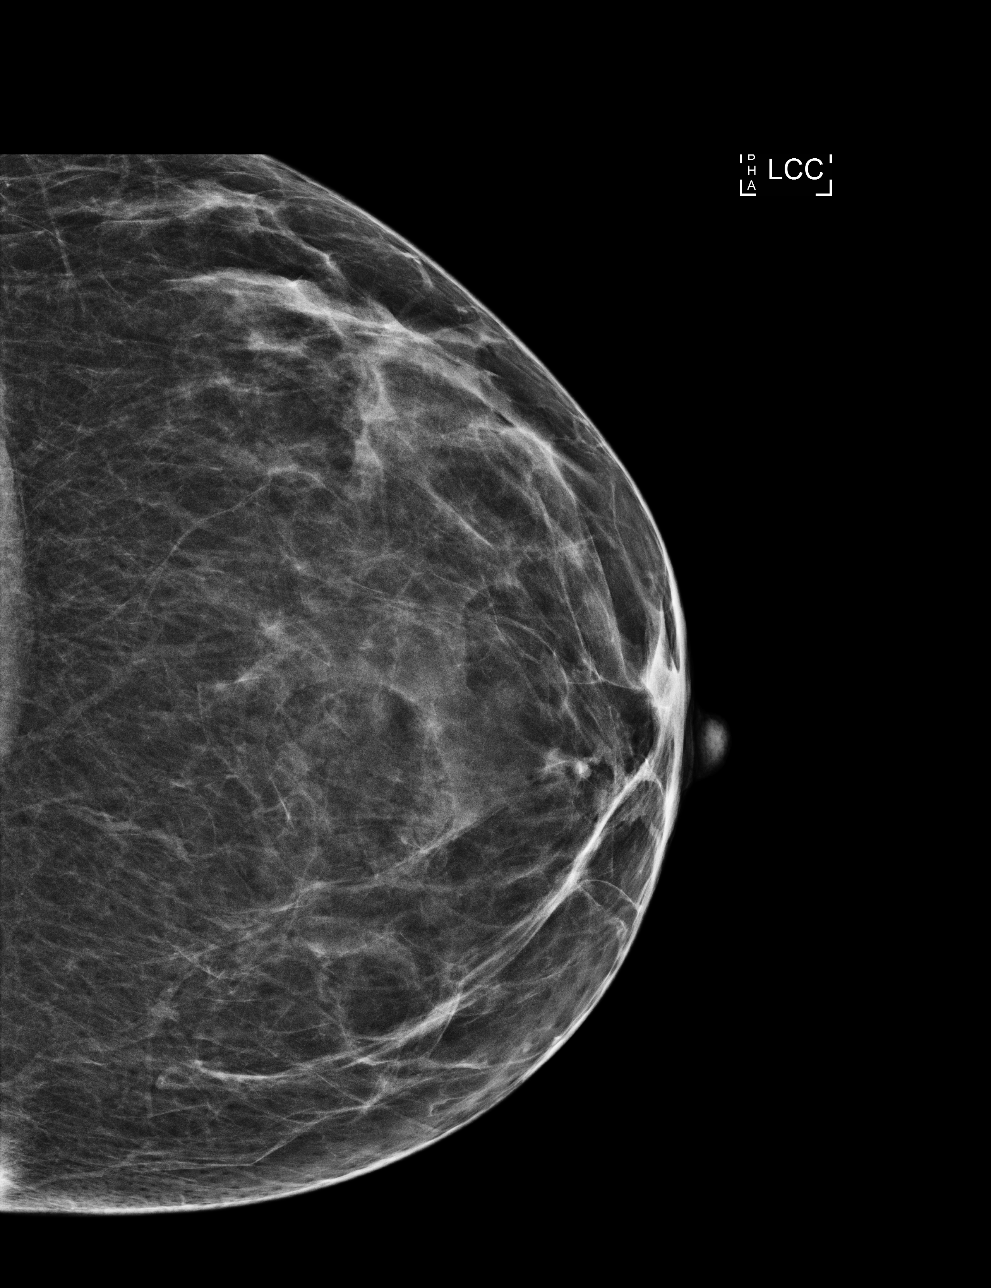

[4 of 4 positions shown; findings below may reference images not displayed]

ACR Breast Density Category b: There are scattered areas of
fibroglandular density.
FINDINGS: There are no findings suspicious for malignancy. Images were
processed with CAD.
IMPRESSION: No mammographic evidence of malignancy. A result letter of this
screening mammogram will be mailed directly to the patient.

RECOMMENDATION:
Screening mammogram in one year. (Code:[US])

BI-RADS CATEGORY  1: Negative.

## 2020-01-26 ENCOUNTER — Other Ambulatory Visit: Payer: Self-pay

## 2020-01-26 DIAGNOSIS — N898 Other specified noninflammatory disorders of vagina: Secondary | ICD-10-CM

## 2020-01-26 MED ORDER — METRONIDAZOLE 0.75 % VA GEL: 1.0000 | Freq: Every day | VAGINAL | 0 refills | Status: DC

## 2020-01-26 NOTE — Progress Notes (Signed)
Rx sent per Dr.Harper since pt had no relief from Flagyl.Rx Pt notified to make appt for urine cx to rule out UTI.

## 2020-01-30 ENCOUNTER — Ambulatory Visit: Payer: BC Managed Care – PPO

## 2020-01-31 ENCOUNTER — Ambulatory Visit (INDEPENDENT_AMBULATORY_CARE_PROVIDER_SITE_OTHER): Payer: BC Managed Care – PPO

## 2020-01-31 ENCOUNTER — Other Ambulatory Visit: Payer: Self-pay

## 2020-01-31 DIAGNOSIS — R300 Dysuria: Secondary | ICD-10-CM

## 2020-01-31 LAB — POCT URINALYSIS DIPSTICK
Bilirubin, UA: NEGATIVE
Glucose, UA: NEGATIVE
Ketones, UA: NEGATIVE
Nitrite, UA: NEGATIVE
Protein, UA: NEGATIVE
Spec Grav, UA: 1.025 (ref 1.010–1.025)
Urobilinogen, UA: 0.2 E.U./dL
pH, UA: 6 (ref 5.0–8.0)

## 2020-01-31 NOTE — Progress Notes (Signed)
SUBJECTIVE: Sherry Blair is a 49 y.o. female who complains of urinary frequency, urgency and dysuria since last office visit with Dr.Haper 01/01/20 without flank pain, fever, chills, or abnormal vaginal discharge or bleeding.   OBJECTIVE: Appears well, in no apparent distress.  . Urine dipstick shows positive for RBC's and positive for leukocytes.    ASSESSMENT: Dysuria  PLAN: Send Urine Culture. Call or return to clinic prn if these symptoms worsen or fail to improve as anticipated. Patient made aware results will be on MyChart.

## 2020-02-01 NOTE — Progress Notes (Signed)
I have reviewed this chart and agree with the RN/CMA assessment and management.    K. Meryl Abdulkarim Eberlin, M.D. Attending [facility] (Faculty Practice)   

## 2020-02-04 LAB — URINE CULTURE

## 2020-02-05 ENCOUNTER — Other Ambulatory Visit: Payer: Self-pay | Admitting: Obstetrics and Gynecology

## 2020-02-05 MED ORDER — NITROFURANTOIN MONOHYD MACRO 100 MG PO CAPS
100.0000 mg | ORAL_CAPSULE | Freq: Two times a day (BID) | ORAL | 0 refills | Status: DC
Start: 2020-02-05 — End: 2020-04-10

## 2020-02-16 DIAGNOSIS — Z20822 Contact with and (suspected) exposure to covid-19: Secondary | ICD-10-CM | POA: Diagnosis not present

## 2020-04-10 ENCOUNTER — Encounter: Payer: Self-pay | Admitting: Obstetrics and Gynecology

## 2020-04-10 ENCOUNTER — Ambulatory Visit (INDEPENDENT_AMBULATORY_CARE_PROVIDER_SITE_OTHER): Payer: Self-pay | Admitting: Obstetrics and Gynecology

## 2020-04-10 ENCOUNTER — Other Ambulatory Visit: Payer: Self-pay

## 2020-04-10 VITALS — BP 117/78 | HR 56 | Wt 206.0 lb

## 2020-04-10 DIAGNOSIS — R339 Retention of urine, unspecified: Secondary | ICD-10-CM

## 2020-04-10 DIAGNOSIS — N3941 Urge incontinence: Secondary | ICD-10-CM

## 2020-04-10 DIAGNOSIS — R350 Frequency of micturition: Secondary | ICD-10-CM

## 2020-04-10 DIAGNOSIS — D259 Leiomyoma of uterus, unspecified: Secondary | ICD-10-CM

## 2020-04-10 DIAGNOSIS — N898 Other specified noninflammatory disorders of vagina: Secondary | ICD-10-CM

## 2020-04-10 DIAGNOSIS — N1330 Unspecified hydronephrosis: Secondary | ICD-10-CM

## 2020-04-10 LAB — POCT URINALYSIS DIPSTICK
Bilirubin, UA: NEGATIVE
Glucose, UA: NEGATIVE
Nitrite, UA: NEGATIVE
Protein, UA: POSITIVE — AB
Spec Grav, UA: >=1.03 — AB (ref 1.010–1.025)
Urobilinogen, UA: 1 E.U./dL
pH, UA: 5 (ref 5.0–8.0)

## 2020-04-10 NOTE — Patient Instructions (Signed)
Plan:   1. MRI of pelvis 2. Renal ultrasound 3. Urodynamic testing

## 2020-04-10 NOTE — Addendum Note (Signed)
Addended by: Jaquita Folds on: 04/10/2020 03:04 PM   Modules accepted: Orders

## 2020-04-10 NOTE — Progress Notes (Signed)
I reviewed the physician's note and agree with the plan of care.   [name] Bombard, MD 09/17/2017 10:46 AM

## 2020-04-10 NOTE — Progress Notes (Addendum)
[facility] New Patient Evaluation and Consultation  Referring Provider: [name] Bombard, MD PCP: [facility] Date of Service: 04/10/2020  SUBJECTIVE Chief Complaint: New Patient (Initial Visit) (Dr Jodi Mourning referral - incontinence)  History of Present Illness: Sherry Blair is a 49 y.o. Black or African-American female seen in consultation at the request of Dr. Jodi Mourning for evaluation of urinary leakage.    Review of records significant for: Reports leakage of urine with stress. Sometimes wears a pad. Difficult to socialize because of leakage.   Potentially planning myomectomy and considering having another child.   Urinary Symptoms: Leaks urine with going from sitting to standing, with a full bladder, with movement to the bathroom and with urgency. Usually leaks after the restroom. Denies leakage with cough, laugh and sneeze.  Leaks 8 time(s) per days.  Pad use: 3 liners/ mini-pads per day.   Drinks: flavored water- 3-4 16oz bottles She is bothered by her UI symptoms. Feels that she is always leaking urine, has a strong smell. Has been going on for a few years since her fibroids were diagnosed.   Day time voids 6.  Nocturia: 1 times per night to void. Voiding dysfunction: she empties her bladder well.  does not use a catheter to empty bladder.  When urinating, she feels dribbling after finishing  UTIs: 2 UTI's in the last year.   Denies history of blood in urine and kidney or bladder stones  Pelvic Organ Prolapse Symptoms:                  She Denies a feeling of a bulge the vaginal area. She Denies seeing a bulge.  This bulge is bothersome.  Bowel Symptom: Bowel movements: 1 time(s) per day Stool consistency: soft  Straining: no.  Splinting: no.  Incomplete evacuation: no.  She Denies accidental bowel leakage / fecal incontinence Bowel regimen: none Last colonoscopy: n/a  Sexual Function Sexually active: yes.  Sexual  orientation: did not answer Pain with sex: Yes, deep in the pelvis  Pelvic Pain Denies pelvic pain  Past Medical History:  Past Medical History:  Diagnosis Date  . Anemia    low iron and fibroids  . Chronic kidney disease 09/2018   Acute kidney failure  . History of ectopic pregnancy 07/20/2009  . Infertility, female   . Leiomyoma of uterus      Past Surgical History:   Past Surgical History:  Procedure Laterality Date  . DX LAPAROSCOPY/  EXPLORATORY LAPARTOMY/ RIGHT SALPINGECTOMY/ WEDGE RESECTION FO THE UTERINE CORNU ON THE RIGHT , ECTOPIC PREGNANCY  07-20-2009    dr Delsa Sale  [facility]  . GASTRIC BYPASS       Past OB/GYN History: G4 P2 Vaginal deliveries: 2, Forceps/ Vacuum deliveries: 0, Cesarean section: 0 Menopausal: No, LMP 03/06/20 Contraception: none- had some side effects from OCPs Last pap smear was 08/2019.  Any history of abnormal pap smears: no.   Medications: She has a current medication list which includes the following prescription(s): biotin, vitamin d, ferrous sulfate, ibuprofen, and tranexamic acid.   Allergies: Patient has No Known Allergies.   Social History:  Social History   Tobacco Use  . Smoking status: Never Smoker  . Smokeless tobacco: Never Used  Vaping Use  . Vaping Use: Never used  Substance Use Topics  . Alcohol use: Yes    Alcohol/week: 0.0 standard drinks    Comment: socially  . Drug use: No    Relationship status: long-term partner She lives  with fiance.   She is employed from home. Regular exercise: No History of abuse: No  Family History:   Family History  Problem Relation Age of Onset  . Hyperlipidemia Mother   . Lupus Mother   . Breast cancer Mother   . Hypertension Father   . Diabetes Father   . Hypertension Brother      Review of Systems: Review of Systems  Constitutional: Positive for malaise/fatigue and weight loss.  Neurological: Positive for headaches.  All other systems reviewed and are  negative.    OBJECTIVE Physical Exam: Vitals:   04/10/20 1310  BP: 117/78  Pulse: (!) 56    Physical Exam   GU / Detailed Urogynecologic Evaluation:  Pelvic Exam: Normal external female genitalia; Bartholin's and Skene's glands normal in appearance; urethral meatus normal in appearance, no urethral masses or discharge.   CST: negative   Speculum exam reveals normal vaginal mucosa without atrophy. Cervix normal appearance with copious watery yellow/ green discharge coming from cervix, pooling in vault. Uterus enlarged and 16 week size, irregular. Adnexa no mass, fullness, tenderness.     Pelvic floor strength III/V  Pelvic floor musculature: Right levator non-tender, Right obturator non-tender, Left levator non-tender, Left obturator non-tender  POP-Q:   POP-Q  -2                                            Aa   -2                                           Ba  -9                                              C   2.5                                            Gh  4.5                                            Pb  11                                            tvl   -2                                            Ap  -2                                            Bp  -10  D     Rectal Exam:  deferred  Post-Void Residual (PVR) by Bladder Scan: In order to evaluate bladder emptying, we discussed obtaining a postvoid residual and she agreed to this procedure.  Procedure: The ultrasound unit was placed on the patient's abdomen in the suprapubic region after the patient had voided. A PVR of 298 ml was obtained by bladder scan.  Laboratory Results: Urinalysis    Component Value Date/Time   BILIRUBINUR neg 04/10/2020 1336   PROTEINUR Positive (A) 04/10/2020 1336   UROBILINOGEN 1.0 04/10/2020 1336   NITRITE neg 04/10/2020 1336   LEUKOCYTESUR Trace (A) 04/10/2020 1336    I visualized the urine specimen, noting the  specimen to be dark yellow    09/22/19 Pelvic ultrasound: FINDINGS: Uterus  Measurements: 14.0 x 9.4 x 12.3 cm = volume: 840 mL. Enlarged and heterogeneous. Tiny submucosal leiomyoma anterior upper uterus 2.5 x 1.3 x 2.4 cm. Additional large posterior to central leiomyoma at the mid upper uterus 10.2 x 9.4 x 9.8 cm, based on size and position almost certainly submucosal  ASSESSMENT AND PLAN Sherry Blair is a 49 y.o. with:  1. Frequency of urination   2. Uterine leiomyoma, unspecified location   3. Vaginal discharge   4. Retention of urine   5. Urge incontinence of urine     1. Urge incontinence/ urinary frequency/ elevated PVR - reports symptoms of urge incontinence, but may be due to overflow due as she had elevated PVR today - POC urine showed no sign of infection today - Normally would order renal ultrasound ordered to assess for hydronephrosis since she has been unaware of incomplete emptying, but since I am already planning for pelvic MRI, will also get abdominal MRI at the same time to r/o obstruction and hydronephrosis. - will also need to proceed with urodynamic testing to better evaluate bladder function.   2. Vaginal discharge/ uterine fibroids - Copious discharge coming from cervix, which may be due to degenerating fibroids, or potentially malignancy. Will order pelvic MRI to better characterize fibroids.   Patient will return for urodynamic testing and an appointment to discuss results of testing.   Jaquita Folds, MD   Medical Decision Making:   - Reviewed/ ordered a clinical laboratory test - Reviewed/ ordered a radiologic study - Reviewed/ ordered medicine test - Review and summation of prior records - Independent review of urine specimen

## 2020-05-04 ENCOUNTER — Ambulatory Visit (HOSPITAL_COMMUNITY): Payer: Self-pay | Attending: Obstetrics and Gynecology

## 2020-06-12 ENCOUNTER — Encounter: Payer: Self-pay | Admitting: Obstetrics and Gynecology

## 2020-07-25 ENCOUNTER — Other Ambulatory Visit: Payer: Self-pay | Admitting: Obstetrics

## 2020-07-25 DIAGNOSIS — D500 Iron deficiency anemia secondary to blood loss (chronic): Secondary | ICD-10-CM

## 2020-08-26 ENCOUNTER — Ambulatory Visit (INDEPENDENT_AMBULATORY_CARE_PROVIDER_SITE_OTHER): Payer: 59 | Admitting: Obstetrics & Gynecology

## 2020-08-26 ENCOUNTER — Encounter: Payer: Self-pay | Admitting: Obstetrics & Gynecology

## 2020-08-26 ENCOUNTER — Other Ambulatory Visit: Payer: Self-pay

## 2020-08-26 VITALS — BP 111/73 | HR 67 | Ht 63.0 in | Wt 212.0 lb

## 2020-08-26 DIAGNOSIS — N393 Stress incontinence (female) (male): Secondary | ICD-10-CM

## 2020-08-26 DIAGNOSIS — D259 Leiomyoma of uterus, unspecified: Secondary | ICD-10-CM | POA: Diagnosis not present

## 2020-08-26 DIAGNOSIS — N939 Abnormal uterine and vaginal bleeding, unspecified: Secondary | ICD-10-CM

## 2020-08-26 NOTE — Progress Notes (Signed)
Patient ID: Sherry Blair, female   DOB: [birth year], 50 y.o.   MRN: [id number]  Chief Complaint  Patient presents with  . Consult    Discuss hysterectomy    HPI Sherry Blair is a 50 y.o. female.  [id number] Patient's last menstrual period was 08/25/2020 (exact date). Patient did not complete her evaluation by Dr. Wannetta Sender as she lost her insurance temporarily. She still has heavy menses and urinary incontinence and wants to have definitive treatment.  HPI  Past Medical History:  Diagnosis Date  . Anemia    low iron and fibroids  . Chronic kidney disease 09/2018   Acute kidney failure  . History of ectopic pregnancy 07/20/2009  . Infertility, female   . Leiomyoma of uterus     Past Surgical History:  Procedure Laterality Date  . DX LAPAROSCOPY/  EXPLORATORY LAPARTOMY/ RIGHT SALPINGECTOMY/ WEDGE RESECTION FO THE UTERINE CORNU ON THE RIGHT , ECTOPIC PREGNANCY  07-20-2009    dr Delsa Sale  [facility]  . GASTRIC BYPASS      Family History  Problem Relation Age of Onset  . Hyperlipidemia Mother   . Lupus Mother   . Breast cancer Mother   . Hypertension Father   . Diabetes Father   . Hypertension Brother     Social History Social History   Tobacco Use  . Smoking status: Never Smoker  . Smokeless tobacco: Never Used  Vaping Use  . Vaping Use: Never used  Substance Use Topics  . Alcohol use: Yes    Alcohol/week: 0.0 standard drinks    Comment: socially  . Drug use: No    No Known Allergies  Current Outpatient Medications  Medication Sig Dispense Refill  . Biotin 1 MG CAPS Take by mouth.    . Cholecalciferol (VITAMIN D) 50 MCG (2000 UT) CAPS Take by mouth.    . ferrous sulfate 325 (65 FE) MG tablet Take 1 tablet (325 mg total) by mouth every other day. 30 tablet 5  . ibuprofen (ADVIL) 800 MG tablet TAKE 1 TABLET BY MOUTH EVERY 8 HOURS START BEFORE PERIOD UNTIL PERIOD ENDS, MONTHLY 30 tablet 2  . tranexamic acid (LYSTEDA) 650 MG TABS tablet Take 2  tablets (1,300 mg total) by mouth 3 (three) times daily. To control heavy bleeding (Patient not taking: No sig reported) 30 tablet 2   No current facility-administered medications for this visit.    Review of Systems Review of Systems  Constitutional: Negative.   Gastrointestinal: Positive for abdominal pain.  Genitourinary: Positive for frequency, menstrual problem and pelvic pain. Negative for vaginal discharge.    Blood pressure 111/73, pulse 67, height 5\' 3"  (1.6 m), weight 212 lb (96.2 kg), last menstrual period 08/25/2020.  Physical Exam Physical Exam Vitals and nursing note reviewed.  Constitutional:      Appearance: Normal appearance. She is not ill-appearing.  Pulmonary:     Effort: Pulmonary effort is normal.  Skin:    General: Skin is warm and dry.  Neurological:     Mental Status: She is alert.  Psychiatric:        Mood and Affect: Mood normal.        Behavior: Behavior normal.     Data Reviewed Pap result normal 08/2019 Assessment SUI (stress urinary incontinence, female) - Plan: Ambulatory referral to Urogynecology  Abnormal uterine bleeding (AUB)    Plan I encouraged her to follow Dr.Schroeder's recommendation to complete evaluation of her fibroids and urinary tract. She will call to reschedule with her office.  Sherry Blair 08/26/2020, 2:36 PM

## 2020-08-26 NOTE — Patient Instructions (Signed)
Uterine Fibroids  Uterine fibroids are lumps of tissue (tumors) in the womb (uterus). Fibroids are not cancerous. Most women with this condition do not need treatment. Sometimes, fibroids can make it harder to have children. If this happens, you may need surgery to take out the fibroids. What are the causes? The cause of this condition is not known. What increases the risk?  You are in your 30s or 40s and have not gone through menopause. Menopause is when you have not had a menstrual period for 12 months.  Having a history of fibroids in your family.  You are of African American descent.  You started your period at age 4 or younger.  You have not given birth.  You are overweight or very overweight. What are the signs or symptoms?  Bleeding between menstrual periods.  Heavy bleeding during your menstrual period.  Pain in the area between your hips.  Needing to pee (urinate) right away or more often than usual.  Not being able to have children (infertility).  Not being able to stay pregnant (miscarriage). Many women do not have symptoms.  How is this treated? Treatment may include:  Follow-up visits with your doctor to check your fibroids for any changes.  Medicines to help with pain, such as aspirin or ibuprofen.  Hormone therapy. This may be given as a pill, in a shot, or with a type of birth control device called an IUD.  Surgery that would do one of these things: ? Take out the fibroids. This may be done if you want to become pregnant. ? Take out the womb (hysterectomy). ? Stop the blood flow to the fibroids. Follow these instructions at home: Medicines  Take over-the-counter and prescription medicines only as told by your doctor.  Ask your doctor if you should: ? Take iron pills. ? Eat more foods that have a lot of iron in them, such as dark green, leafy vegetables. Managing pain If told, put heat on your back or belly. Do this as often as told by your  doctor. Use the heat source that your doctor recommends, such as a moist heat pack or a heating pad. To do this:  Put a towel between your skin and the heat pack or pad.  Leave the heat on for 20-30 minutes.  Take off the heat if your skin turns bright red. This is very important. If you cannot feel pain, heat, or cold, you may have a greater risk of getting burned.   General instructions  Tell your doctor about any changes to your menstrual period, such as: ? Heavy bleeding that needs a change of tampons or pads more than normal. ? A change in how many days your period lasts. ? A change in symptoms that come with your period. This might be belly cramps or back pain.  Keep all follow-up visits. Contact a doctor if:  You have pain that does not get better with medicine or heat. This may include pain or cramps in: ? The area between your hip bones. ? Your back. ? Your belly.  You have new bleeding between your periods.  You have more bleeding during or between your periods.  You feel very tired or weak.  You feel dizzy. Get help right away if:  You faint.  You have pain in the area between your hip bones that gets worse.  You have bleeding that soaks a tampon or pad in 30 minutes or less. Summary  Uterine fibroids are lumps of  tissue (tumors) in your womb. They are not cancerous.  Medicines such as aspirin or ibuprofen may be used to help with pain.  Contact a doctor if you have pain or cramps that do not get better with medicine.  Know the symptoms for when you should get help right away. This information is not intended to replace advice given to you by your health care provider. Make sure you discuss any questions you have with your health care provider. Document Revised: 01/09/2020 Document Reviewed: 01/09/2020 Elsevier Patient Education  2021 [employer]  

## 2020-08-26 NOTE — Progress Notes (Signed)
Pt presents today to discuss hysterectomy. Pt is having painful, heavy cycles. They are usually every 28 days and last about 5-6 days with 3 being very heavy changing pad about every 1-2 hours. Pt started menstrual cycle when she was 13. Pt states the heavy cycles have only been within the last 3 years. Last gyn exam was 08/2019, pap was normal.

## 2020-08-29 NOTE — Progress Notes (Signed)
[facility] Urodynamics Procedure  Referring Physician: [facility]* Date of Procedure: 09/02/2020  Sherry Blair is a 50 y.o. female who presents for urodynamic evaluation. Indication(s) for study: mixed incontinence and incomplete bladder emptying  Vital Signs: BP 106/71   Pulse 74   Ht 5\' 3"  (1.6 m)   Wt 212 lb (96.2 kg)   LMP 08/25/2020 (Exact Date)   BMI 37.55 kg/m   Laboratory Results: A catheterized urine specimen revealed:  POC urine: negative   Voiding Diary: Not performed  Procedure Timeout:  The correct patient was verified and the correct procedure was verified. The patient was in the correct position and safety precautions were reviewed based on at the patient's history.  Urodynamic Procedure A 5F dual lumen urodynamics catheter was placed under sterile conditions into the patient's bladder. A 5F catheter was placed into the vagina in order to measure abdominal pressure. EMG patches were placed in the appropriate position.  All connections were confirmed and calibrations/adjusted made. Saline was instilled into the bladder through the dual lumen catheters.  Cough/valsalva pressures were measured periodically during filling.  Patient was allowed to void.  The bladder was then emptied of its residual.  UROFLOW: Uroflow did not record voided volume. Voided 33ml, PVR 22ml via catheterization.   CMG: This was performed with sterile water in the sitting position at a fill rate of 20- 30 mL/min.    First sensation of fullness was 46 mLs,  First urge was 60 mLs,  Strong urge was 70 mLs and  Capacity was 433 mLs  Stress incontinence was demonstrated Highest negative CLPP was 116 cmH20 at 150 ml. Lowest positive VLPP was 76 cmH20at 433 ml.  Detrusor function was normal, with no phasic contractions seen.    Compliance:  normal. End fill detrusor pressure was 0cmH20.    UPP: MUCP with/ without barrier reduction was 116 cm of  water.    MICTURITION STUDY: Voiding was performed without reduction in the sitting position.  Pdet at Qmax was 42 cm of water.  Qmax was 10 mL/sec.  It was a interrupted pattern.  She voided 227 mL through the Uroflow (more was present on the floor) and had a residual of 75 mL.  It was a volitional void, sustained detrusor contraction was present and abdominal straining was present  EMG: This was performed with patches.  She had voluntary contractions, recruitment with fill was present and urethral sphincter was relaxed with void.  The details of the procedure with the study tracings have been scanned into EPIC.   Urodynamic Impression:  1. Sensation was increased; capacity was normal 2. Stress Incontinence was demonstrated at normal pressures; 3. Detrusor Overactivity was not demonstrated. 4. Emptying was normal with a normal PVR, a sustained detrusor contraction present,  abdominal straining present, normal urethral sphincter activity on EMG.  Plan: - The patient will follow up  to discuss the findings and treatment options.   Jaquita Folds, MD

## 2020-09-02 ENCOUNTER — Ambulatory Visit (INDEPENDENT_AMBULATORY_CARE_PROVIDER_SITE_OTHER): Payer: 59 | Admitting: Obstetrics and Gynecology

## 2020-09-02 ENCOUNTER — Encounter: Payer: Self-pay | Admitting: Obstetrics and Gynecology

## 2020-09-02 VITALS — BP 106/71 | HR 74 | Ht 63.0 in | Wt 212.0 lb

## 2020-09-02 DIAGNOSIS — R350 Frequency of micturition: Secondary | ICD-10-CM | POA: Diagnosis not present

## 2020-09-02 DIAGNOSIS — N393 Stress incontinence (female) (male): Secondary | ICD-10-CM

## 2020-09-02 LAB — POCT URINALYSIS DIPSTICK
Appearance: NORMAL
Bilirubin, UA: NEGATIVE
Blood, UA: NEGATIVE
Glucose, UA: NEGATIVE
Ketones, UA: NEGATIVE
Leukocytes, UA: NEGATIVE
Nitrite, UA: NEGATIVE
Protein, UA: NEGATIVE
Spec Grav, UA: 1.01 (ref 1.010–1.025)
Urobilinogen, UA: 0.2 E.U./dL
pH, UA: 5 (ref 5.0–8.0)

## 2020-09-02 NOTE — Patient Instructions (Signed)
Taking Care of Yourself after Urodynamics, Cystoscopy, Coaptite Injection, or Botox Injection   Drink plenty of water for a day or two following your procedure. Try to have about 8 ounces (one cup) at a time, and do this 6 times or more per day unless you have fluid restrictitons AVOID irritative beverages such as coffee, tea, soda, alcoholic or citrus drinks for a day or two, as this may cause burning with urination.  For the first 1-2 days after the procedure, your urine may be pink or red in color. You may have some blood in your urine as a normal side effect of the procedure. Large amounts of bleeding or difficulty urinating are NOT normal. Call the nurse line if this happens or go to the nearest Emergency Room if the bleeding is heavy or you cannot urinate at all and it is after hours.  You may experience some discomfort or a burning sensation with urination after having this procedure. You can use over the counter Azo or pyridium to help with burning and follow the instructions on the packaging. If it does not improve within 1-2 days, or other symptoms appear (fever, chills, or difficulty urinating) call the office to speak to a nurse.  You may return to normal daily activities such as work, school, driving, exercising and housework on the day of the procedure. If your doctor gave you a prescription, take it as ordered.  If you need a return appointment, the front desk staff will arrange it when you check out.   

## 2020-09-11 ENCOUNTER — Other Ambulatory Visit: Payer: Self-pay

## 2020-09-11 ENCOUNTER — Ambulatory Visit (INDEPENDENT_AMBULATORY_CARE_PROVIDER_SITE_OTHER): Payer: 59 | Admitting: Bariatrics

## 2020-09-11 ENCOUNTER — Encounter (INDEPENDENT_AMBULATORY_CARE_PROVIDER_SITE_OTHER): Payer: Self-pay | Admitting: Bariatrics

## 2020-09-11 VITALS — BP 107/74 | HR 69 | Temp 98.0°F | Ht 63.0 in | Wt 207.0 lb

## 2020-09-11 DIAGNOSIS — Z6836 Body mass index (BMI) 36.0-36.9, adult: Secondary | ICD-10-CM

## 2020-09-11 DIAGNOSIS — E559 Vitamin D deficiency, unspecified: Secondary | ICD-10-CM

## 2020-09-11 DIAGNOSIS — R5383 Other fatigue: Secondary | ICD-10-CM | POA: Diagnosis not present

## 2020-09-11 DIAGNOSIS — Z0289 Encounter for other administrative examinations: Secondary | ICD-10-CM

## 2020-09-11 DIAGNOSIS — D509 Iron deficiency anemia, unspecified: Secondary | ICD-10-CM | POA: Diagnosis not present

## 2020-09-11 DIAGNOSIS — R7309 Other abnormal glucose: Secondary | ICD-10-CM

## 2020-09-11 DIAGNOSIS — D75839 Thrombocytosis, unspecified: Secondary | ICD-10-CM

## 2020-09-11 DIAGNOSIS — R0602 Shortness of breath: Secondary | ICD-10-CM | POA: Diagnosis not present

## 2020-09-11 DIAGNOSIS — Z9189 Other specified personal risk factors, not elsewhere classified: Secondary | ICD-10-CM | POA: Diagnosis not present

## 2020-09-11 DIAGNOSIS — Z1331 Encounter for screening for depression: Secondary | ICD-10-CM

## 2020-09-11 DIAGNOSIS — E7800 Pure hypercholesterolemia, unspecified: Secondary | ICD-10-CM

## 2020-09-12 ENCOUNTER — Encounter (INDEPENDENT_AMBULATORY_CARE_PROVIDER_SITE_OTHER): Payer: Self-pay | Admitting: Bariatrics

## 2020-09-12 ENCOUNTER — Ambulatory Visit (HOSPITAL_COMMUNITY): Payer: 59

## 2020-09-12 DIAGNOSIS — E559 Vitamin D deficiency, unspecified: Secondary | ICD-10-CM | POA: Insufficient documentation

## 2020-09-12 LAB — COMPREHENSIVE METABOLIC PANEL
ALT: 34 IU/L — ABNORMAL HIGH (ref 0–32)
AST: 32 IU/L (ref 0–40)
Albumin/Globulin Ratio: 1.3 (ref 1.2–2.2)
Albumin: 3.7 g/dL — ABNORMAL LOW (ref 3.8–4.8)
Alkaline Phosphatase: 60 IU/L (ref 44–121)
BUN/Creatinine Ratio: 19 (ref 9–23)
BUN: 11 mg/dL (ref 6–24)
Bilirubin Total: 0.4 mg/dL (ref 0.0–1.2)
CO2: 20 mmol/L (ref 20–29)
Calcium: 8.5 mg/dL — ABNORMAL LOW (ref 8.7–10.2)
Chloride: 104 mmol/L (ref 96–106)
Creatinine, Ser: 0.59 mg/dL (ref 0.57–1.00)
Globulin, Total: 2.9 g/dL (ref 1.5–4.5)
Glucose: 89 mg/dL (ref 65–99)
Potassium: 4.6 mmol/L (ref 3.5–5.2)
Sodium: 140 mmol/L (ref 134–144)
Total Protein: 6.6 g/dL (ref 6.0–8.5)
eGFR: 110 mL/min/{1.73_m2} (ref >59)

## 2020-09-12 LAB — ANEMIA PANEL
Ferritin: 37 ng/mL (ref 15–150)
Folate, Hemolysate: 373 ng/mL
Folate, RBC: 1100 ng/mL (ref >498)
Hematocrit: 33.9 % — ABNORMAL LOW (ref 34.0–46.6)
Iron Saturation: 18 % (ref 15–55)
Iron: 69 ug/dL (ref 27–159)
Retic Ct Pct: 1.2 % (ref 0.6–2.6)
Total Iron Binding Capacity: 390 ug/dL (ref 250–450)
UIBC: 321 ug/dL (ref 131–425)
Vitamin B-12: 519 pg/mL (ref 232–1245)

## 2020-09-12 LAB — LIPID PANEL WITH LDL/HDL RATIO
Cholesterol, Total: 197 mg/dL (ref 100–199)
HDL: 71 mg/dL (ref >39)
LDL Chol Calc (NIH): 113 mg/dL — ABNORMAL HIGH (ref 0–99)
LDL/HDL Ratio: 1.6 ratio (ref 0.0–3.2)
Triglycerides: 74 mg/dL (ref 0–149)
VLDL Cholesterol Cal: 13 mg/dL (ref 5–40)

## 2020-09-12 LAB — VITAMIN D 25 HYDROXY (VIT D DEFICIENCY, FRACTURES): Vit D, 25-Hydroxy: 19.4 ng/mL — ABNORMAL LOW (ref 30.0–100.0)

## 2020-09-12 LAB — INSULIN, RANDOM: INSULIN: 5.3 u[IU]/mL (ref 2.6–24.9)

## 2020-09-12 LAB — HEMOGLOBIN A1C
Est. average glucose Bld gHb Est-mCnc: 105 mg/dL
Hgb A1c MFr Bld: 5.3 % (ref 4.8–5.6)

## 2020-09-12 LAB — TSH+T4F+T3FREE
Free T4: 0.99 ng/dL (ref 0.82–1.77)
T3, Free: 1.8 pg/mL — ABNORMAL LOW (ref 2.0–4.4)
TSH: 4.27 u[IU]/mL (ref 0.450–4.500)

## 2020-09-12 NOTE — Progress Notes (Signed)
Chief Complaint:   OBESITY Sherry Blair (MR# [id number]) is a 50 y.o. female who presents for evaluation and treatment of obesity and related comorbidities. Current BMI is Body mass index is 36.67 kg/m. Sherry Blair has been struggling with her weight for many years and has been unsuccessful in either losing weight, maintaining weight loss, or reaching her healthy weight goal.  Sherry Blair is currently in the action stage of change and ready to dedicate time achieving and maintaining a healthier weight. Sherry Blair is interested in becoming our patient and working on intensive lifestyle modifications including (but not limited to) diet and exercise for weight loss.  Sherry Blair does like to cook and denies barriers. She craves carbohydrates.  Sherry Blair's habits were reviewed today and are as follows: Her family eats meals together, she thinks her family will eat healthier with her, her desired weight loss is 62 lbs, she started gaining weight within the past 2 years, her heaviest weight ever was 255 pounds, she has significant food cravings issues and she is frequently drinking liquids with calories.  Depression Screen Sherry Blair's Food and Mood (modified PHQ-9) score was 2.  Depression screen Sherry Blair 2/9 09/11/2020  Decreased Interest 1  Down, Depressed, Hopeless 0  PHQ - 2 Score 1  Altered sleeping 0  Tired, decreased energy 1  Change in appetite 0  Feeling bad or failure about yourself  0  Trouble concentrating 0  Moving slowly or fidgety/restless 0  Suicidal thoughts 0  PHQ-9 Score 2  Difficult doing work/chores Somewhat difficult   Subjective:   1. Other fatigue Sherry Blair admits to daytime somnolence and admits to waking up still tired. Patent has a history of symptoms of morning fatigue. Sherry Blair generally gets 8 hours of sleep per night, and states that she has generally restful sleep. Snoring is present. Apneic episodes are not present. Epworth Sleepiness Score is 4.  2. SOB (shortness of  breath) on exertion Sherry Blair notes increasing shortness of breath with exercising and seems to be worsening over time with weight gain. She notes getting out of breath sooner with activity than she used to. This has not gotten worse recently. Sherry Blair denies shortness of breath at rest or orthopnea.  3. Thrombocytosis Unknown reason.  4. Iron deficiency anemia, unspecified iron deficiency anemia type Sherry Blair has a history of heavy vaginal bleeding and fibroids. She is taking ferrous sulfate.  5. Elevated glucose Sherry Blair has a paternal family history of elevated glucose.  6. Elevated cholesterol Sherry Blair is not on statin therapy.  7. Vitamin D deficiency Sherry Blair is taking OTC Vit D3 2,000 IU daily.  8. At risk for activity intolerance Sherry Blair is at risk for exercise intolerance due to obesity.  Assessment/Plan:   1. Other fatigue Sherry Blair does feel that her weight is causing her energy to be lower than it should be. Fatigue may be related to obesity, depression or many other causes. Labs will be ordered, and in the meanwhile, Sherry Blair will focus on self care including making healthy food choices, increasing physical activity and focusing on stress reduction. Check labs today.  - EKG 12-Lead - Vitamin B12 - CBC with Differential/Platelet - Insulin, random - Lipid Panel With LDL/HDL Ratio - T3 - T4, free - TSH - TSH+T4F+T3Free  2. SOB (shortness of breath) on exertion Sherry Blair does not feel that she gets out of breath more easily that she used to when she exercises. Sherry Blair's shortness of breath appears to be obesity related and exercise induced. She has agreed to  work on weight loss and gradually increase exercise to treat her exercise induced shortness of breath. Will continue to monitor closely.  3. Thrombocytosis Check labs today.  4. Iron deficiency anemia, unspecified iron deficiency anemia type Check anemia panel today.  - Anemia panel  5. Elevated glucose Fasting labs will be  obtained and results with be discussed with Sherry Blair in 2 weeks at her follow up visit. In the meanwhile Sherry Blair was started on a lower simple carbohydrate diet and will work on weight loss efforts. Check labs today.  - Hemoglobin A1c - Insulin, random  6. Elevated cholesterol Cardiovascular risk and specific lipid/LDL goals reviewed.  We discussed several lifestyle modifications today and Sherry Blair will continue to work on diet, exercise and weight loss efforts. Orders and follow up as documented in patient record. Check labs today.  Counseling Intensive lifestyle modifications are the first line treatment for this issue. . Dietary changes: Increase soluble fiber. Decrease simple carbohydrates. . Exercise changes: Moderate to vigorous-intensity aerobic activity 150 minutes per week if tolerated. . Lipid-lowering medications: see documented in medical record.  - Comprehensive metabolic panel - Lipid Panel With LDL/HDL Ratio  7. Vitamin D deficiency Low Vitamin D level contributes to fatigue and are associated with obesity, breast, and colon cancer. She agrees to continue to take OTC Vitamin D @2 ,000 IU daily and will follow-up for routine testing of Vitamin D, at least 2-3 times per year to avoid over-replacement. Check labs today.  - VITAMIN D 25 Hydroxy (Vit-D Deficiency, Fractures)  8. Depression screen Sherry Blair had a negative depression screening. Depression is commonly associated with obesity and often results in emotional eating behaviors. We will monitor this closely and work on CBT to help improve the non-hunger eating patterns. Referral to Psychology may be required if no improvement is seen as she continues in our clinic.  9. At risk for activity intolerance Sherry Blair was given approximately 15 minutes of exercise intolerance counseling today. She is 50 y.o. female and has risk factors exercise intolerance including obesity. We discussed intensive lifestyle modifications today with an  emphasis on specific weight loss instructions and strategies. Sherry Blair will slowly increase activity as tolerated.  Repetitive spaced learning was employed today to elicit superior memory formation and behavioral change.  10. Class 2 severe obesity due to excess calories with serious comorbidity and body mass index (BMI) of 36.0 to 36.9 in adult [facility]) Raiza is currently in the action stage of change and her goal is to continue with weight loss efforts. I recommend Bernadean begin the structured treatment plan as follows:  She has agreed to the Category 3 Plan.  Exercise goals: No exercise has been prescribed at this time.  Behavioral modification strategies: increasing lean protein intake, decreasing simple carbohydrates, increasing vegetables, increasing water intake, decreasing eating out, no skipping meals, meal planning and cooking strategies, keeping healthy foods in the home and planning for success.  She was informed of the importance of frequent follow-up visits to maximize her success with intensive lifestyle modifications for her multiple health conditions. She was informed we would discuss her lab results at her next visit unless there is a critical issue that needs to be addressed sooner. Dorena agreed to keep her next visit at the agreed upon time to discuss these results.  Objective:   Blood pressure 107/74, pulse 69, temperature 98 F (36.7 C), height 5\' 3"  (1.6 m), weight 207 lb (93.9 kg), last menstrual period 08/25/2020, SpO2 98 %. Body mass index is 36.67 kg/m.  EKG: Normal sinus rhythm, rate 70.  Indirect Calorimeter completed today shows a VO2 of 296 and a REE of 2062.  Her calculated basal metabolic rate is 2111 thus her basal metabolic rate is better than expected.  General: Cooperative, alert, well developed, in no acute distress. HEENT: Conjunctivae and lids unremarkable. Cardiovascular: Regular rhythm.  Lungs: Normal work of breathing. Neurologic: No focal deficits.    Lab Results  Component Value Date   CREATININE 0.59 09/11/2020   BUN 11 09/11/2020   NA 140 09/11/2020   K 4.6 09/11/2020   CL 104 09/11/2020   CO2 20 09/11/2020   Lab Results  Component Value Date   ALT 34 (H) 09/11/2020   AST 32 09/11/2020   ALKPHOS 60 09/11/2020   BILITOT 0.4 09/11/2020   Lab Results  Component Value Date   HGBA1C 5.3 09/11/2020   Lab Results  Component Value Date   INSULIN 5.3 09/11/2020   Lab Results  Component Value Date   TSH 4.270 09/11/2020   Lab Results  Component Value Date   CHOL 197 09/11/2020   HDL 71 09/11/2020   LDLCALC 113 (H) 09/11/2020   TRIG 74 09/11/2020   Lab Results  Component Value Date   WBC 6.2 02/10/2019   HGB 7.7 (A) 09/29/2019   HCT 33.9 (L) 09/11/2020   MCV 56.2 (L) 02/10/2019   PLT 328 02/10/2019   Lab Results  Component Value Date   IRON 69 09/11/2020   TIBC 390 09/11/2020   FERRITIN 37 09/11/2020    Attestation Statements:   Reviewed by clinician on day of visit: allergies, medications, problem list, medical history, surgical history, family history, social history, and previous encounter notes.  Coral Ceo, am acting as Location manager for [employer], DO.  I have reviewed the above documentation for accuracy and completeness, and I agree with the above. Jearld Lesch, DO

## 2020-09-15 ENCOUNTER — Encounter (INDEPENDENT_AMBULATORY_CARE_PROVIDER_SITE_OTHER): Payer: Self-pay | Admitting: Bariatrics

## 2020-09-18 ENCOUNTER — Ambulatory Visit: Payer: 59 | Admitting: Obstetrics and Gynecology

## 2020-09-24 ENCOUNTER — Ambulatory Visit (HOSPITAL_COMMUNITY)
Admission: RE | Admit: 2020-09-24 | Discharge: 2020-09-24 | Disposition: A | Discharge status: Home / Self Care | Payer: 59 | Source: Ambulatory Visit | Attending: Obstetrics and Gynecology | Admitting: Obstetrics and Gynecology

## 2020-09-24 ENCOUNTER — Encounter (HOSPITAL_COMMUNITY): Payer: Self-pay

## 2020-09-24 ENCOUNTER — Other Ambulatory Visit: Payer: Self-pay

## 2020-09-24 DIAGNOSIS — N898 Other specified noninflammatory disorders of vagina: Secondary | ICD-10-CM | POA: Insufficient documentation

## 2020-09-24 DIAGNOSIS — N1330 Unspecified hydronephrosis: Secondary | ICD-10-CM | POA: Diagnosis present

## 2020-09-24 DIAGNOSIS — D259 Leiomyoma of uterus, unspecified: Secondary | ICD-10-CM | POA: Diagnosis not present

## 2020-09-24 IMAGING — MR MR ABDOMEN WO/W CM
22 of 32 series · 25 of 48 positions shown · IV contrast (gadavist)
Comparison: None

CLINICAL DATA: Symptomatic fibroids. Elevated postvoid residual.
Hydronephrosis.

EXAM:
MRI ABDOMEN AND PELVIS WITHOUT AND WITH CONTRAST
TECHNIQUE: Multiplanar multisequence MR imaging of the abdomen and pelvis was
performed both before and after the administration of intravenous
contrast.
CONTRAST:  9mL GADAVIST GADOBUTROL 1 MMOL/ML IV SOLN

[Series 3: cor ssfse nav · coronal · 6.0mm · 0.86mm/px · 1 of 38 slices shown]
[im 1/38]
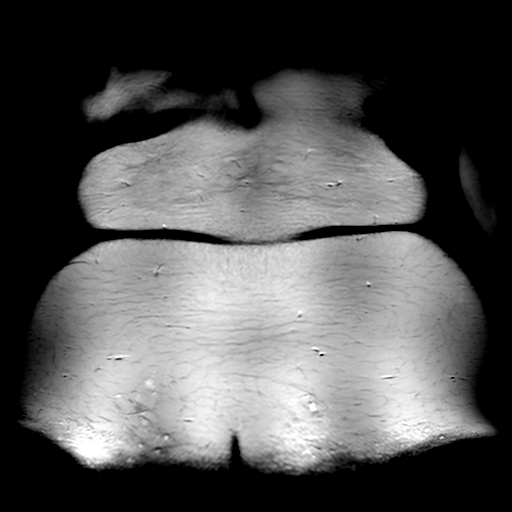

[Series 4: ax ssfse nav · axial · 6.0mm · 0.82mm/px · 1 of 44 slices shown]
[im 1/44]
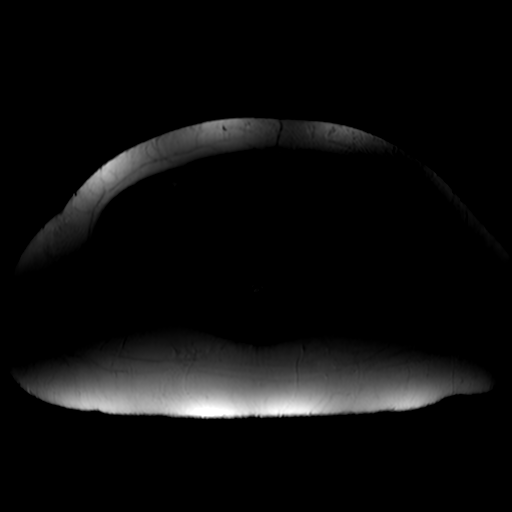

[Series 5: T2 fat-sat · axial · 6.0mm · 0.82mm/px · 1 of 40 slices shown (1 of 2)]
[im 1/40]
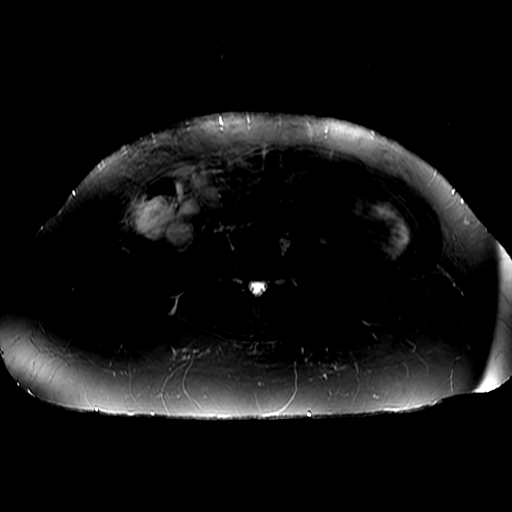

[Series 6: DWI b500 · axial · 8.0mm · 1.64mm/px · 1 of 54 slices shown]
[im 1/54]
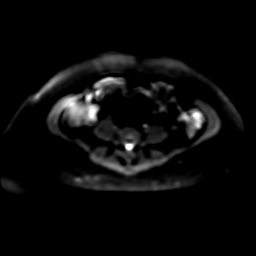

[Series 8: bSSFP · axial · 4.0mm · 0.82mm/px · 1 of 64 slices shown]
[im 1/64]
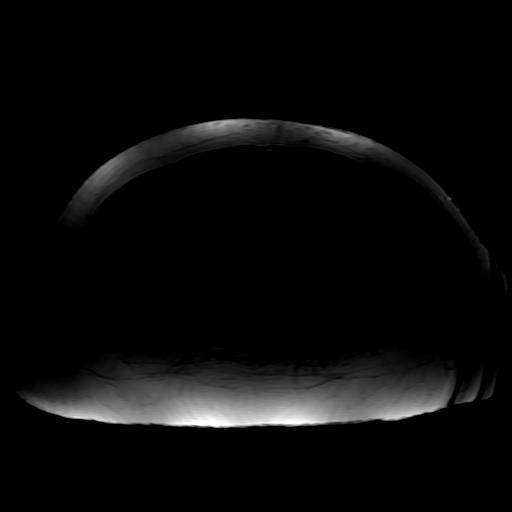

[Series 10: cor ssfse overview · coronal · 4.0mm · 0.82mm/px · 1 of 48 slices shown]
[im 1/48]
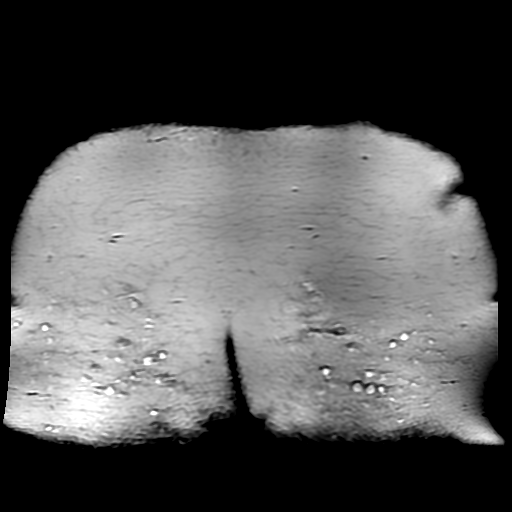

[Series 11: T2 · axial · 4.0mm · 0.55mm/px · 1 of 40 slices shown (1 of 3)]
[im 1/40]
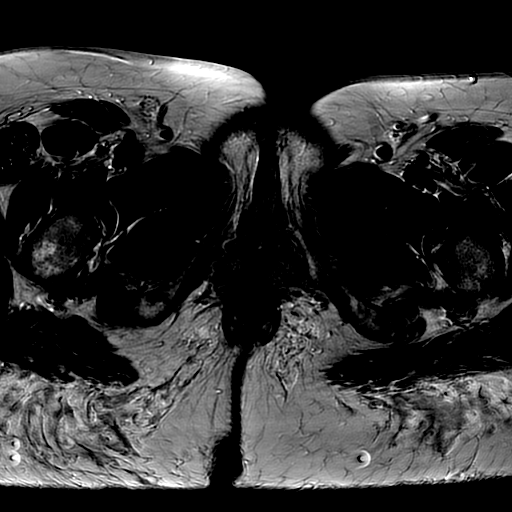

[Series 12: T2 fat-sat · axial · 4.0mm · 0.55mm/px · 1 of 40 slices shown (2 of 2)]
[im 1/40]
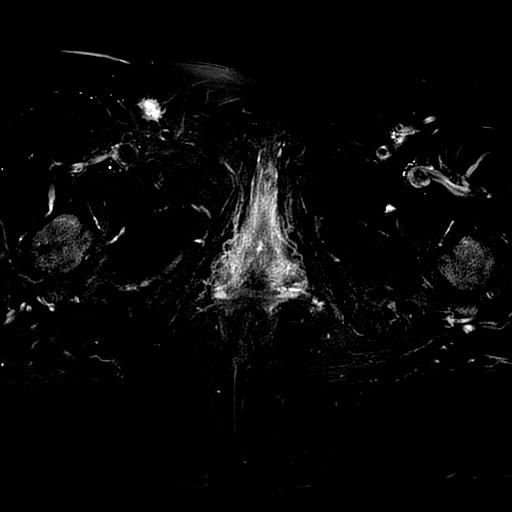

[Series 13: T2 · sagittal · 4.0mm · 0.47mm/px · 1 of 40 slices shown (2 of 3)]
[im 1/40]
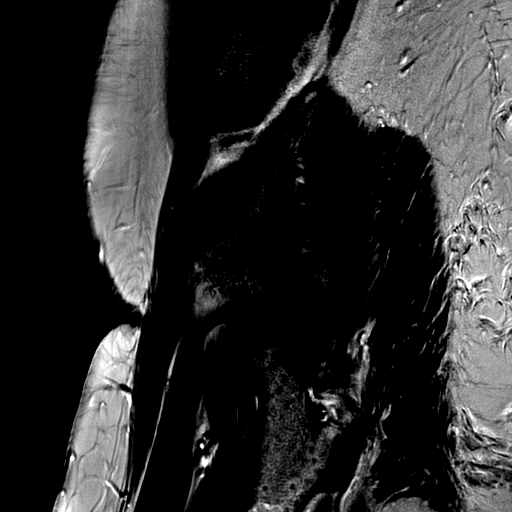

[Series 14: DWI · axial · 8.0mm · 1.09mm/px · 1 of 60 slices shown]
[im 1/60]
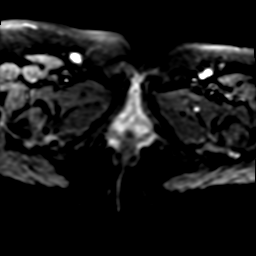

[Series 15: T2 · coronal · 4.0mm · 0.47mm/px · 1 of 38 slices shown (3 of 3)]
[im 1/38]
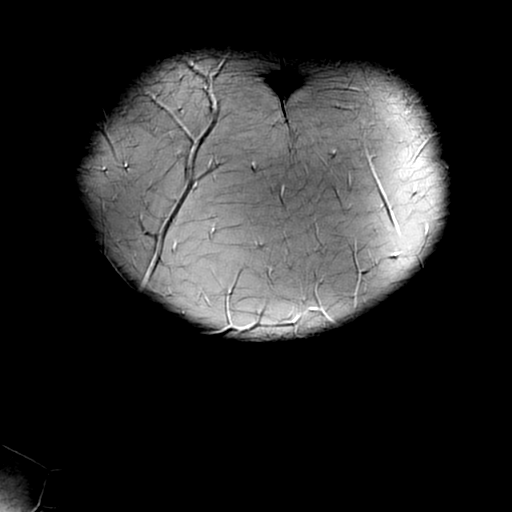

[Series 17: T1 dynamic · axial · non-contrast · 4.0mm · 0.94mm/px · 1 of 46 slices shown (1 of 3)]
[im 1/46]
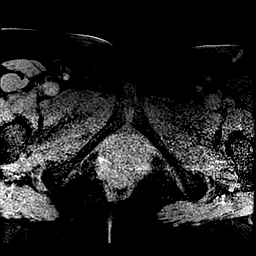

[Series 19: T1 dynamic post-contrast · axial · 4.0mm · 0.94mm/px · 1 of 46 slices shown (1 of 3)]
[im 1/46]
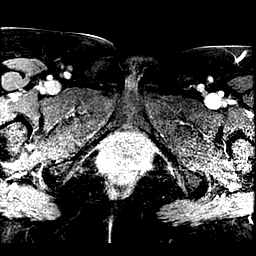

[Series 20: T1 dynamic post-contrast · axial · 3.9mm · 0.86mm/px · z∈[+31,+301]mm · 2 of 136 slices shown (2 of 3)]
[im 1/136]
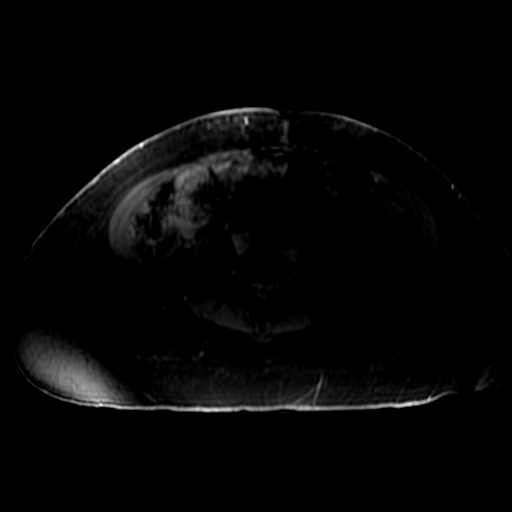
[im 136/136]
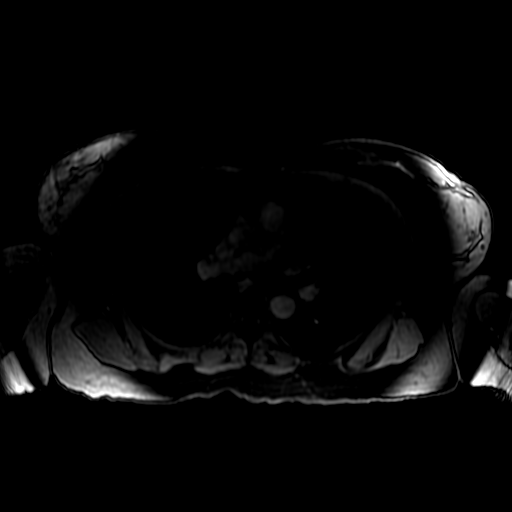

[Series 21: T1 dynamic · axial · 4.0mm · 1.37mm/px · 1 of 42 slices shown (2 of 3)]
[im 1/42]
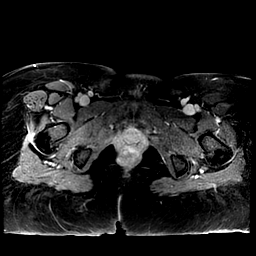

[Series 22: T2 post-contrast · sagittal · 4.0mm · 0.47mm/px · 1 of 40 slices shown]
[im 1/40]
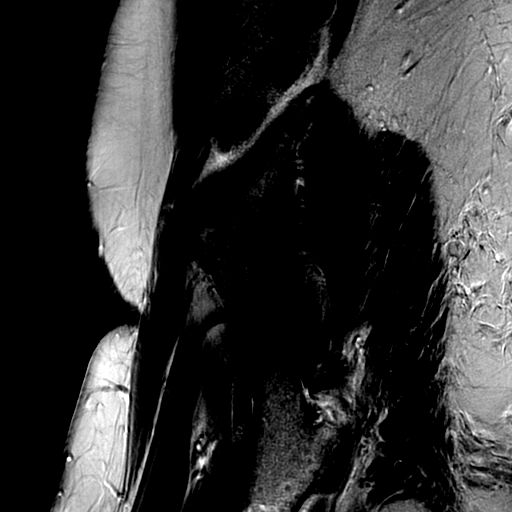

[Series 23: T1 · sagittal · 4.0mm · 0.47mm/px · 1 of 40 slices shown]
[im 1/40]
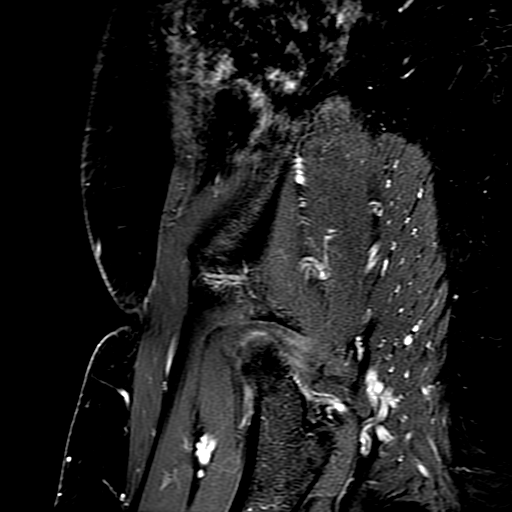

[Series 24: T1 dynamic · coronal · 3.3mm · 1.72mm/px · 2 of 136 slices shown (3 of 3)]
[im 1/136]
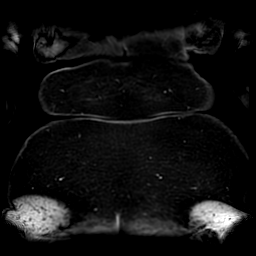
[im 136/136]
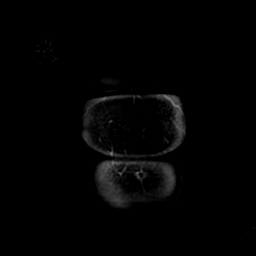

[Series 104: ((date)..46)-((date)..46) · axial · 4.0mm · 0.94mm/px · 1 of 46 slices shown]
[im 1/46]
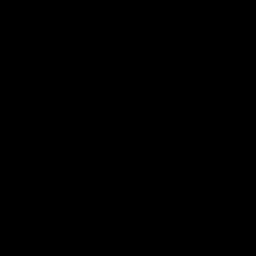

[Series 650: ADC · axial · 8.0mm · 1.64mm/px · 1 of 27 slices shown (1 of 2)]
[im 1/27]
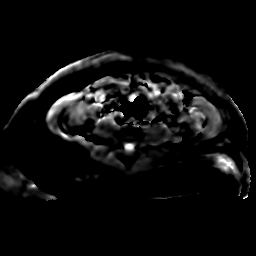

[Series 1450: ADC · axial · 8.0mm · 1.09mm/px · 1 of 20 slices shown (2 of 2)]
[im 1/20]
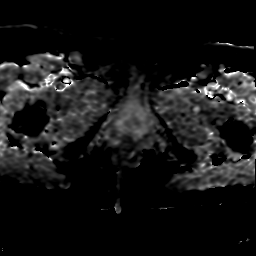

[Series 1800: T1 dynamic post-contrast · axial · non-contrast · 3.9mm · 0.86mm/px · z∈[+31,+301]mm · 2 of 136 slices shown (3 of 3)]
[im 1/136]
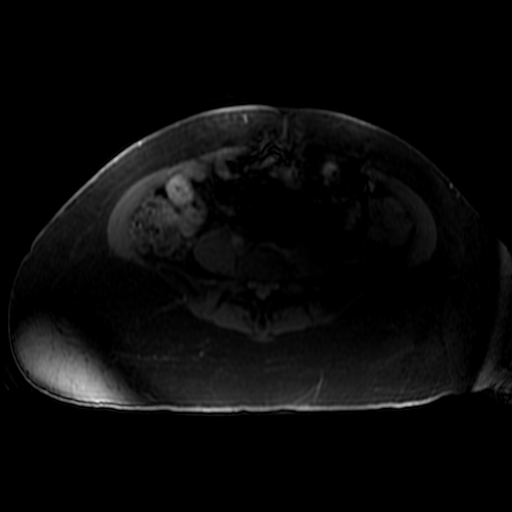
[im 136/136]
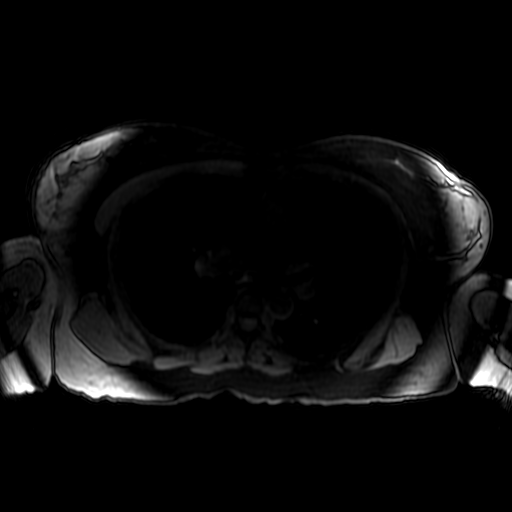

[25 of 48 positions shown; findings below may reference images not displayed]

FINDINGS: COMBINED FINDINGS FOR BOTH MR ABDOMEN AND PELVIS

Lower Chest: No acute findings.

Hepatobiliary: No hepatic masses identified. Gallbladder is
unremarkable. No evidence of biliary ductal dilatation.

Pancreas:  No mass or inflammatory changes.

Spleen: Within normal limits in size and appearance.

Adrenals/Urinary Tract: No masses identified. Two small right renal
sinus cysts are noted, however there is no evidence hydronephrosis
or ureteral dilatation. Urinary bladder is normal in appearance.

Stomach/Bowel: No evidence of obstruction, inflammatory process or
abnormal fluid collections.

Vascular/Lymphatic: No pathologically enlarged lymph nodes. No
abdominal aortic aneurysm.

Reproductive:

-- Uterus: Measures 15.4 x 11.3 x 11.5 cm (volume = [IK] cm^3).
Multiple uterine fibroids are seen which involve the uterus
diffusely. These range in size from less than 1 cm, to the largest
in the posterior uterine corpus measuring 9.9 cm in maximum
diameter.

-- Intracavitary fibroids:  None.

-- Pedunculated fibroids: None.

-- Fibroid contrast enhancement: All fibroids show contrast
enhancement, without significant degeneration/devascularization.

-- Right ovary:  Appears normal.  No mass identified.

-- Left ovary:  Appears normal.  No mass identified.

Other:  Tiny amount of free fluid noted in the right adnexa.

Musculoskeletal:  No suspicious bone lesions identified.
IMPRESSION: Diffuse uterine involvement by multiple uterine fibroids, largest
measuring 9.9 cm. No intracavitary or pedunculated fibroids
identified.

Normal appearance of both ovaries. No adnexal mass identified.

Two small right renal sinus cysts noted. No evidence of
hydroureteronephrosis. Normal appearance of bladder.

## 2020-09-24 IMAGING — MR MR PELVIS WO/W CM
22 of 32 series · 25 of 48 positions shown · IV contrast (Y GAD)
Comparison: None

CLINICAL DATA: Symptomatic fibroids. Elevated postvoid residual.
Hydronephrosis.

EXAM:
MRI ABDOMEN AND PELVIS WITHOUT AND WITH CONTRAST
TECHNIQUE: Multiplanar multisequence MR imaging of the abdomen and pelvis was
performed both before and after the administration of intravenous
contrast.
CONTRAST:  9mL GADAVIST GADOBUTROL 1 MMOL/ML IV SOLN

[Series 3: cor ssfse nav · coronal · 6.0mm · 0.86mm/px · 1 of 38 slices shown]
[im 1/38]
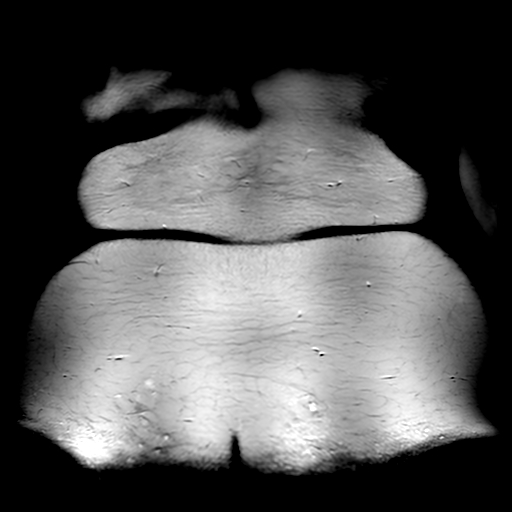

[Series 4: ax ssfse nav · axial · 6.0mm · 0.82mm/px · 1 of 44 slices shown]
[im 1/44]
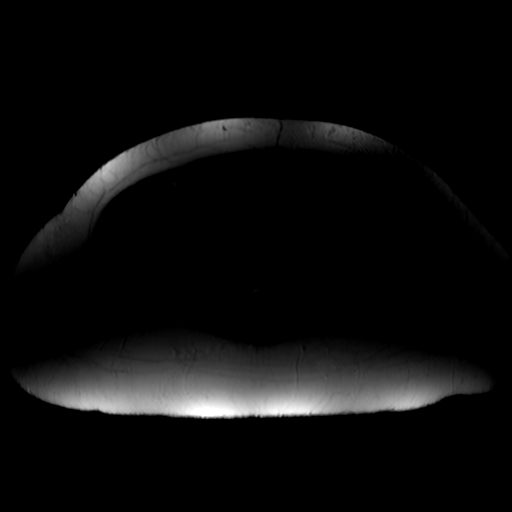

[Series 5: T2 fat-sat · axial · 6.0mm · 0.82mm/px · 1 of 40 slices shown (1 of 2)]
[im 1/40]
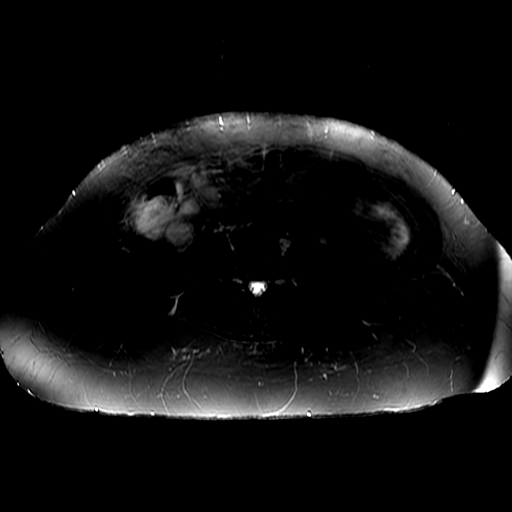

[Series 6: DWI b500 · axial · 8.0mm · 1.64mm/px · 1 of 54 slices shown]
[im 1/54]
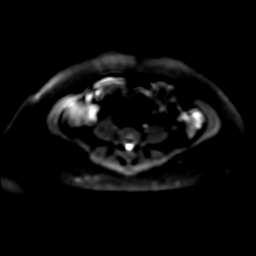

[Series 8: bSSFP · axial · 4.0mm · 0.82mm/px · 1 of 64 slices shown]
[im 1/64]
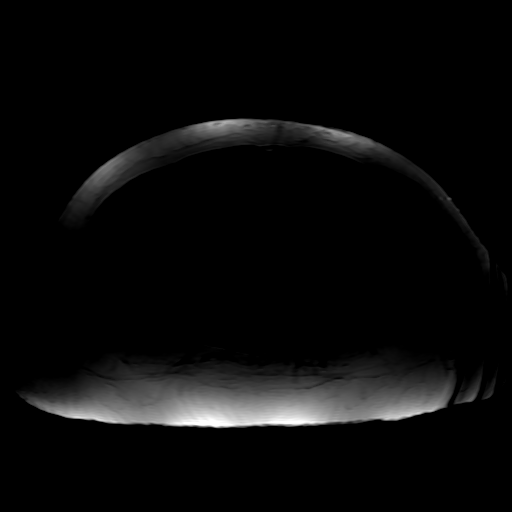

[Series 10: cor ssfse overview · coronal · 4.0mm · 0.82mm/px · 1 of 48 slices shown]
[im 1/48]
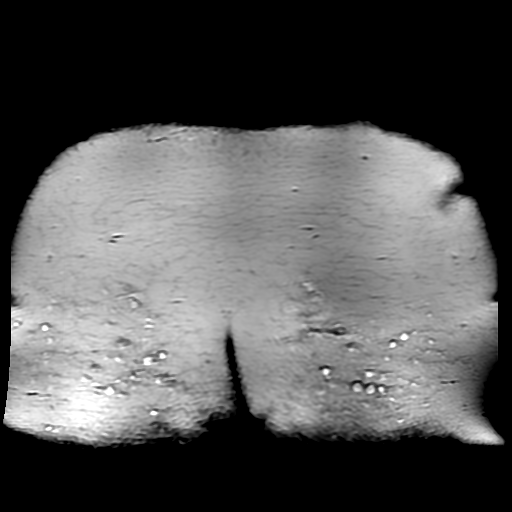

[Series 11: T2 · axial · 4.0mm · 0.55mm/px · 1 of 40 slices shown (1 of 3)]
[im 1/40]
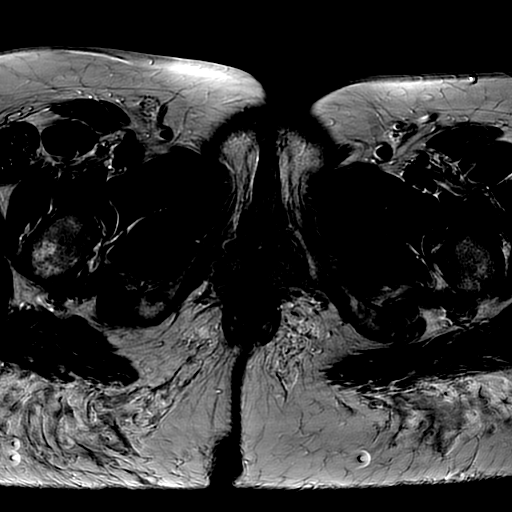

[Series 12: T2 fat-sat · axial · 4.0mm · 0.55mm/px · 1 of 40 slices shown (2 of 2)]
[im 1/40]
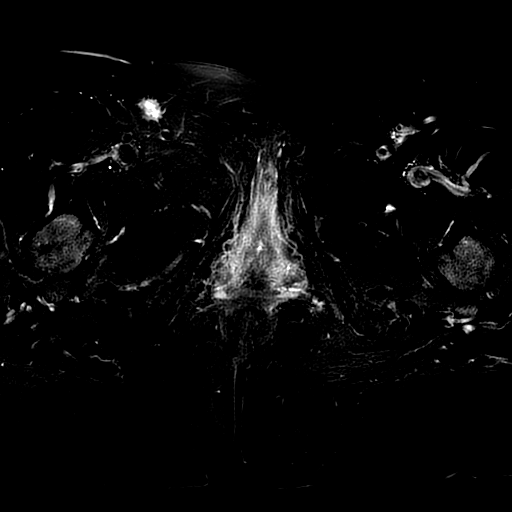

[Series 13: T2 · sagittal · 4.0mm · 0.47mm/px · 1 of 40 slices shown (2 of 3)]
[im 1/40]
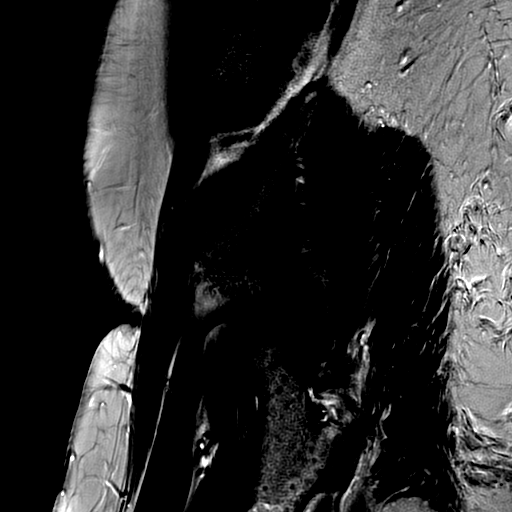

[Series 14: DWI · axial · 8.0mm · 1.09mm/px · 1 of 60 slices shown]
[im 1/60]
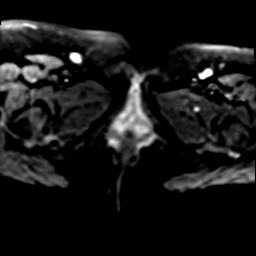

[Series 15: T2 · coronal · 4.0mm · 0.47mm/px · 1 of 38 slices shown (3 of 3)]
[im 1/38]
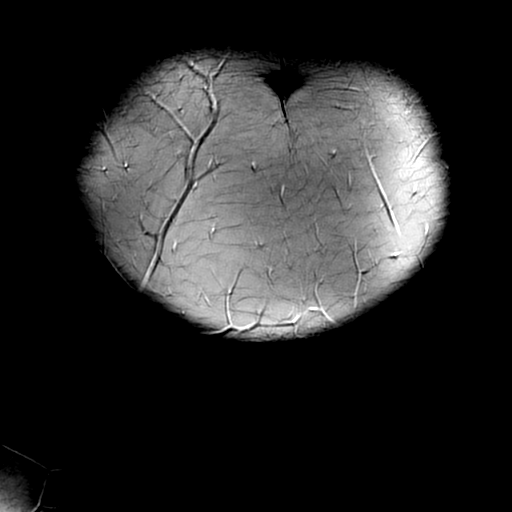

[Series 17: T1 dynamic · axial · non-contrast · 4.0mm · 0.94mm/px · 1 of 46 slices shown (1 of 3)]
[im 1/46]
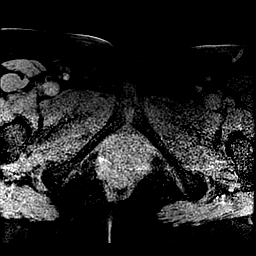

[Series 19: T1 dynamic post-contrast · axial · 4.0mm · 0.94mm/px · 1 of 46 slices shown (1 of 3)]
[im 1/46]
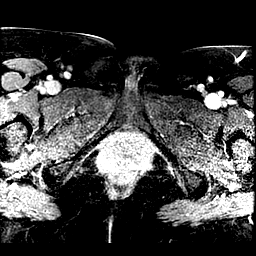

[Series 20: T1 dynamic post-contrast · axial · 3.9mm · 0.86mm/px · z∈[+31,+301]mm · 2 of 136 slices shown (2 of 3)]
[im 1/136]
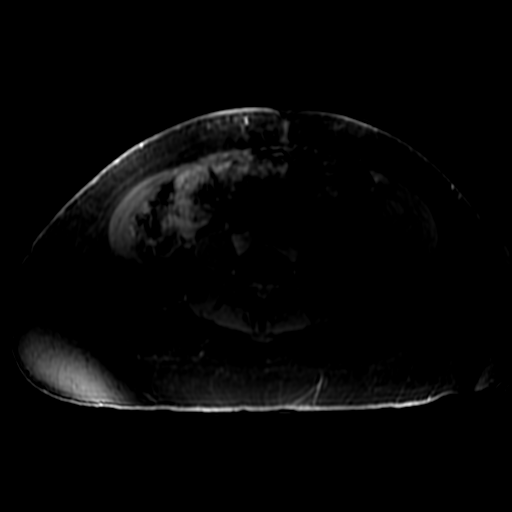
[im 136/136]
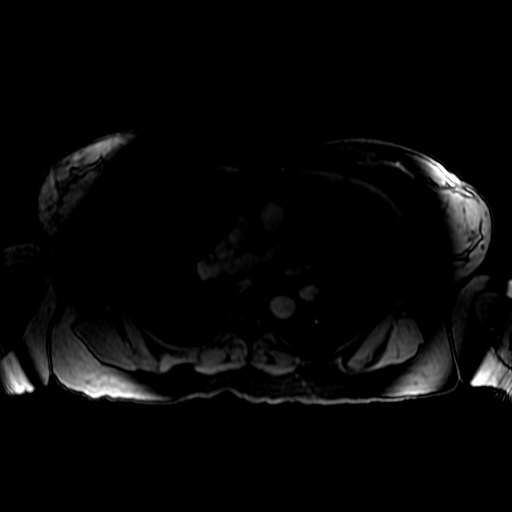

[Series 21: T1 dynamic · axial · 4.0mm · 1.37mm/px · 1 of 42 slices shown (2 of 3)]
[im 1/42]
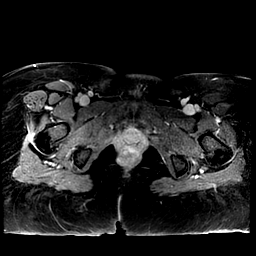

[Series 22: T2 post-contrast · sagittal · 4.0mm · 0.47mm/px · 1 of 40 slices shown]
[im 1/40]
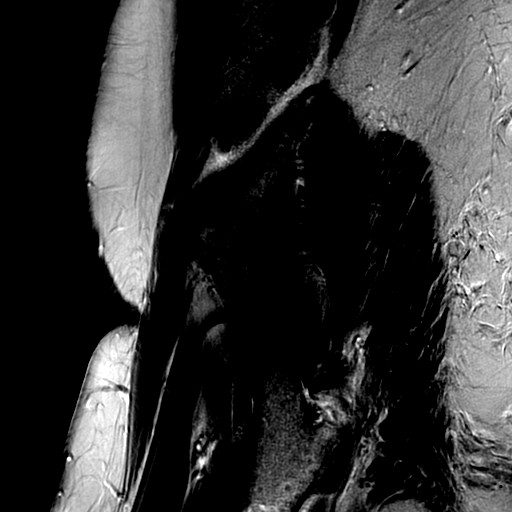

[Series 23: T1 · sagittal · 4.0mm · 0.47mm/px · 1 of 40 slices shown]
[im 1/40]
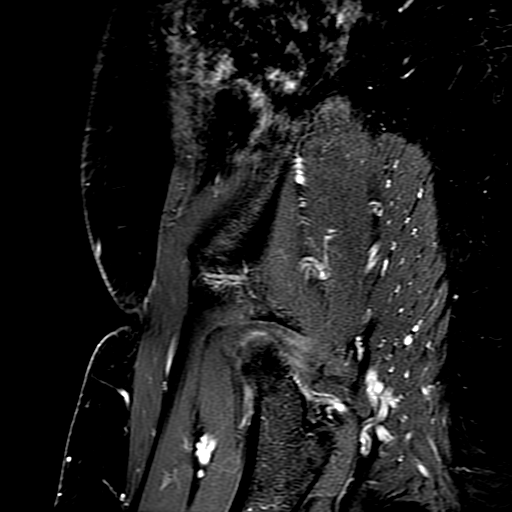

[Series 24: T1 dynamic · coronal · 3.3mm · 1.72mm/px · 2 of 136 slices shown (3 of 3)]
[im 1/136]
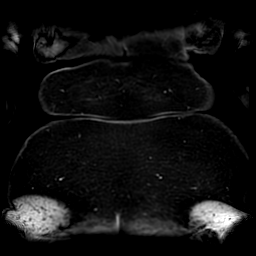
[im 136/136]
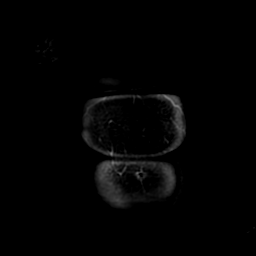

[Series 104: ((date)..46)-((date)..46) · axial · 4.0mm · 0.94mm/px · 1 of 46 slices shown]
[im 1/46]
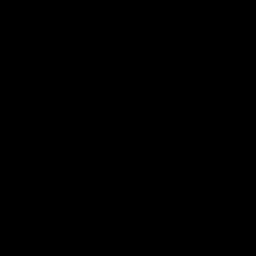

[Series 650: ADC · axial · 8.0mm · 1.64mm/px · 1 of 27 slices shown (1 of 2)]
[im 1/27]
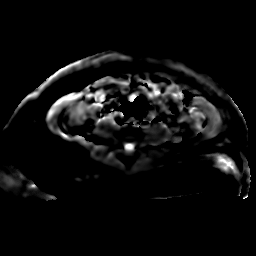

[Series 1450: ADC · axial · 8.0mm · 1.09mm/px · 1 of 20 slices shown (2 of 2)]
[im 1/20]
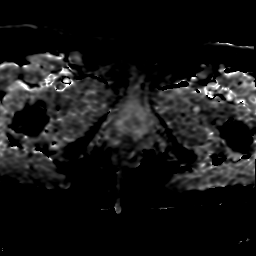

[Series 1800: T1 dynamic post-contrast · axial · non-contrast · 3.9mm · 0.86mm/px · z∈[+31,+301]mm · 2 of 136 slices shown (3 of 3)]
[im 1/136]
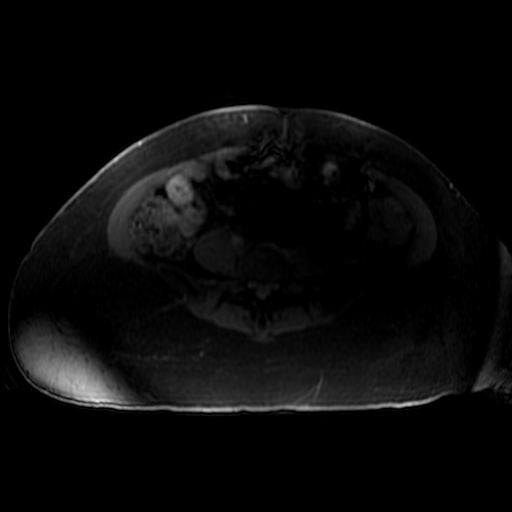
[im 136/136]
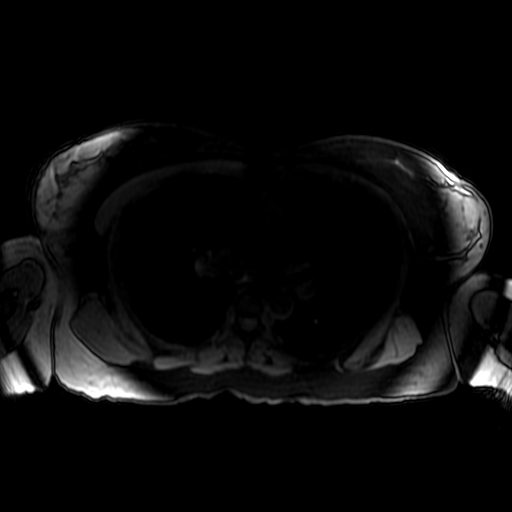

[25 of 48 positions shown; findings below may reference images not displayed]

FINDINGS: COMBINED FINDINGS FOR BOTH MR ABDOMEN AND PELVIS

Lower Chest: No acute findings.

Hepatobiliary: No hepatic masses identified. Gallbladder is
unremarkable. No evidence of biliary ductal dilatation.

Pancreas:  No mass or inflammatory changes.

Spleen: Within normal limits in size and appearance.

Adrenals/Urinary Tract: No masses identified. Two small right renal
sinus cysts are noted, however there is no evidence hydronephrosis
or ureteral dilatation. Urinary bladder is normal in appearance.

Stomach/Bowel: No evidence of obstruction, inflammatory process or
abnormal fluid collections.

Vascular/Lymphatic: No pathologically enlarged lymph nodes. No
abdominal aortic aneurysm.

Reproductive:

-- Uterus: Measures 15.4 x 11.3 x 11.5 cm (volume = [IK] cm^3).
Multiple uterine fibroids are seen which involve the uterus
diffusely. These range in size from less than 1 cm, to the largest
in the posterior uterine corpus measuring 9.9 cm in maximum
diameter.

-- Intracavitary fibroids:  None.

-- Pedunculated fibroids: None.

-- Fibroid contrast enhancement: All fibroids show contrast
enhancement, without significant degeneration/devascularization.

-- Right ovary:  Appears normal.  No mass identified.

-- Left ovary:  Appears normal.  No mass identified.

Other:  Tiny amount of free fluid noted in the right adnexa.

Musculoskeletal:  No suspicious bone lesions identified.
IMPRESSION: Diffuse uterine involvement by multiple uterine fibroids, largest
measuring 9.9 cm. No intracavitary or pedunculated fibroids
identified.

Normal appearance of both ovaries. No adnexal mass identified.

Two small right renal sinus cysts noted. No evidence of
hydroureteronephrosis. Normal appearance of bladder.

## 2020-09-24 MED ORDER — GADOBUTROL 1 MMOL/ML IV SOLN
9.0000 mL | Freq: Once | INTRAVENOUS | Status: AC | PRN
  Administered 2020-09-24: 9 mL via INTRAVENOUS

## 2020-09-25 ENCOUNTER — Other Ambulatory Visit: Payer: Self-pay

## 2020-09-25 ENCOUNTER — Ambulatory Visit (INDEPENDENT_AMBULATORY_CARE_PROVIDER_SITE_OTHER): Payer: 59 | Admitting: Bariatrics

## 2020-09-25 VITALS — BP 107/74 | HR 72 | Temp 98.3°F | Ht 63.0 in | Wt 202.0 lb

## 2020-09-25 DIAGNOSIS — Z6836 Body mass index (BMI) 36.0-36.9, adult: Secondary | ICD-10-CM | POA: Diagnosis not present

## 2020-09-25 DIAGNOSIS — D508 Other iron deficiency anemias: Secondary | ICD-10-CM

## 2020-09-25 DIAGNOSIS — E559 Vitamin D deficiency, unspecified: Secondary | ICD-10-CM | POA: Diagnosis not present

## 2020-09-25 DIAGNOSIS — Z9189 Other specified personal risk factors, not elsewhere classified: Secondary | ICD-10-CM

## 2020-09-25 MED ORDER — VITAMIN D (ERGOCALCIFEROL) 1.25 MG (50000 UNIT) PO CAPS
50000.0000 [IU] | ORAL_CAPSULE | ORAL | 0 refills | Status: AC
Start: 2020-09-25 — End: ?

## 2020-09-26 NOTE — Progress Notes (Signed)
[facility] Return Visit  SUBJECTIVE  History of Present Illness: Sherry Blair is a 50 y.o. female seen in follow-up after urodynamic testing and MRI.   She has not had a period in a few months- she thinks she may be starting menopause. She is looking for a less invasive procedure for her fibroids- has an appointment with Dr Dellis Filbert to discuss the [facility]. She is also potentially interested in uterine artery embolization.   As far as her leakage, it is not as bothersome as her fibroid symptoms currently.   Urodynamic Impression:  1. Sensation was increased; capacity was normal 2. Stress Incontinence was demonstrated at normal pressures; 3. Detrusor Overactivity was not demonstrated. 4. Emptying was normal with a normal PVR, a sustained detrusor contraction present,  abdominal straining present, normal urethral sphincter activity on EMG.  MRI A/P: Reproductive:  -- Uterus: Measures 15.4 x 11.3 x 11.5 cm (volume = 1050 cm^3). Multiple uterine fibroids are seen which involve the uterus diffusely. These range in size from less than 1 cm, to the largest in the posterior uterine corpus measuring 9.9 cm in maximum diameter.  -- Intracavitary fibroids:  None.  -- Pedunculated fibroids: None.  -- Fibroid contrast enhancement: All fibroids show contrast enhancement, without significant degeneration/devascularization.  -- Right ovary:  Appears normal.  No mass identified.  -- Left ovary:  Appears normal.  No mass identified.  Other:  Tiny amount of free fluid noted in the right adnexa.  Musculoskeletal:  No suspicious bone lesions identified.  IMPRESSION: Diffuse uterine involvement by multiple uterine fibroids, largest measuring 9.9 cm. No intracavitary or pedunculated fibroids identified.  Normal appearance of both ovaries. No adnexal mass identified.  Two small right renal sinus cysts noted. No evidence of hydroureteronephrosis. Normal  appearance of bladder.   Past Medical History: Patient  has a past medical history of Anemia, Chronic kidney disease (09/2018), Fibroids, GERD (gastroesophageal reflux disease), History of ectopic pregnancy (07/20/2009), Infertility, female, Leiomyoma of uterus, and Vitamin D deficiency.   Past Surgical History: She  has a past surgical history that includes Gastric bypass and DX LAPAROSCOPY/  EXPLORATORY LAPARTOMY/ RIGHT SALPINGECTOMY/ WEDGE RESECTION FO THE UTERINE CORNU ON THE RIGHT , ECTOPIC PREGNANCY (07-20-2009    dr Delsa Sale  [facility]).   Medications: She has a current medication list which includes the following prescription(s): vitamin c, biotin, vitamin d, ferrous sulfate, ibuprofen, and vitamin d (ergocalciferol).   Allergies: Patient has No Known Allergies.   Social History: Patient  reports that she has never smoked. She has never used smokeless tobacco. She reports current alcohol use. She reports that she does not use drugs.      OBJECTIVE     Physical Exam: Vitals:   09/30/20 1446  BP: 104/70  Pulse: 80  Weight: 202 lb (91.6 kg)   Gen: No apparent distress, A&O x 3.  Detailed Urogynecologic Evaluation:  Deferred.     ASSESSMENT AND PLAN    Ms. Asch is a 50 y.o. with:  1. Intramural and subserous leiomyoma of uterus   2. SUI (stress urinary incontinence, female)    1. Uterine fibroids - She is interested in uterine artery embolization. Discussed that this would be done by radiology and referral placed today. Also has an appt with Dr Dellis Filbert to discuss the sonata radiofrequency ablation as an option.  - She is not interested in a hysterectomy at this time. Potentially would be interested in a Myomectomy if she is not a candidate for one of  the less invasive procedures.   2. SUI For treatment of stress urinary incontinence,  non-surgical options include expectant management, weight loss, physical therapy, as well as a pessary.  Surgical options include a  midurethral sling, Burch urethropexy, and transurethral injection of a bulking agent. - She would like to see how her fibroid procedure goes first since this is likely causing bulk symptoms and pressure on her bladder.  - We discussed that there were previous concerns for incomplete bladder emptying. She emptied well on her urodynamic testing, however she did have abdominal straining with voiding. Discussed pelvic floor relaxation with voiding to avoid incomplete emptying. She may also have some fibroid effect on her uterus. MRI did not show any signs of hydronephrosis.   Handouts were provided on midurethral sling and urethral bulking and she will contact us if she decides to pursue one of these.   Jaquita Folds, MD  Time spent: I spent 30 minutes dedicated to the care of this patient on the date of this encounter to include pre-visit review of records, face-to-face time with the patient and post visit documentation and ordering referrals.

## 2020-09-30 ENCOUNTER — Other Ambulatory Visit: Payer: Self-pay

## 2020-09-30 ENCOUNTER — Encounter: Payer: Self-pay | Admitting: Obstetrics and Gynecology

## 2020-09-30 ENCOUNTER — Ambulatory Visit (INDEPENDENT_AMBULATORY_CARE_PROVIDER_SITE_OTHER): Payer: 59 | Admitting: Obstetrics and Gynecology

## 2020-09-30 ENCOUNTER — Encounter (INDEPENDENT_AMBULATORY_CARE_PROVIDER_SITE_OTHER): Payer: Self-pay | Admitting: Bariatrics

## 2020-09-30 VITALS — BP 104/70 | HR 80 | Wt 202.0 lb

## 2020-09-30 DIAGNOSIS — D252 Subserosal leiomyoma of uterus: Secondary | ICD-10-CM

## 2020-09-30 DIAGNOSIS — N393 Stress incontinence (female) (male): Secondary | ICD-10-CM | POA: Diagnosis not present

## 2020-09-30 DIAGNOSIS — D251 Intramural leiomyoma of uterus: Secondary | ICD-10-CM

## 2020-09-30 NOTE — Progress Notes (Signed)
Chief Complaint:   OBESITY Sherry Blair is here to discuss her progress with her obesity treatment plan along with follow-up of her obesity related diagnoses. Sherry Blair is on the Category 3 Plan and states she is following her eating plan approximately 60% of the time. Sherry Blair states she is not exercising regularly.  Today's visit was #: 2 Starting weight: 207 lbs Starting date: 09/11/2020 Today's weight: 202 lbs Today's date: 09/25/2020 Total lbs lost to date: 5 lbs Total lbs lost since last in-office visit: 5 lbs  Interim History: Sherry Blair is down 5 pounds since her first visit.  She denies any struggles.  Subjective:   1. Vitamin D deficiency Sherry Blair's Vitamin D level was 19.4 on 09/11/2020. She is currently taking OTC vitamin D 2,000 IU each day. She denies nausea, vomiting or muscle weakness.  2. Other iron deficiency anemia Stable.  Taking iron.  CBC Latest Ref Rng & Units 09/11/2020 09/29/2019 02/10/2019  WBC 4.0 - 10.5 K/uL - - 6.2  Hemoglobin 11 - 14.6 g/dL - 7.7(A) 6.9(LL)  Hematocrit 34.0 - 46.6 % 33.9(L) - 27.3(L)  Platelets 150 - 400 K/uL - - 328   Lab Results  Component Value Date   IRON 69 09/11/2020   TIBC 390 09/11/2020   FERRITIN 37 09/11/2020   Lab Results  Component Value Date   VITAMINB12 519 09/11/2020   3. At risk for osteoporosis Sherry Blair is at higher risk of osteopenia and osteoporosis due to Vitamin D deficiency.   Assessment/Plan:   1. Vitamin D deficiency Low Vitamin D level contributes to fatigue and are associated with obesity, breast, and colon cancer. She agrees to start to take prescription Vitamin D @50 ,000 IU every week and will follow-up for routine testing of Vitamin D, at least 2-3 times per year to avoid over-replacement.  - Start Vitamin D, Ergocalciferol, (DRISDOL) 1.25 MG (50000 UNIT) CAPS capsule; Take 1 capsule (50,000 Units total) by mouth every 7 (seven) days.  Dispense: 4 capsule; Refill: 0  2. Other iron deficiency anemia Continue  iron supplement.    Counseling . Iron is essential for our bodies to make red blood cells.  Reasons that someone may be deficient include: an iron-deficient diet (more likely in those following vegan or vegetarian diets), women with heavy menses, patients with GI disorders or poor absorption, patients that have had bariatric surgery, frequent blood donors, patients with cancer, and patients with heart disease.   Sherry Blair foods include dark leafy greens, red and white meats, eggs, seafood, and beans.   . Certain foods and drinks prevent your body from absorbing iron properly. Avoid eating these foods in the same meal as iron-rich foods or with iron supplements. These foods include: coffee, black tea, and red wine; milk, dairy products, and foods that are high in calcium; beans and soybeans; whole grains.  . Constipation can be a side effect of iron supplementation. Increased water and fiber intake are helpful. Water goal: > 2 liters/day. Fiber goal: > 25 grams/day.  3. At risk for osteoporosis Sherry Blair was given approximately 15 minutes of osteoporosis prevention counseling today. Sherry Blair is at risk for osteopenia and osteoporosis due to her Vitamin D deficiency. She was encouraged to take her Vitamin D and follow her higher calcium diet and increase strengthening exercise to help strengthen her bones and decrease her risk of osteopenia and osteoporosis.  Repetitive spaced learning was employed today to elicit superior memory formation and behavioral change.  4. Obesity, current BMI 35  Sherry Blair is  currently in the action stage of change. As such, her goal is to continue with weight loss efforts. She has agreed to keeping a food journal and adhering to recommended goals of 1500 calories and 90 grams of protein and the [place] +300 calories for snacks.  She will work on meal planning, intentional eating.  Labs from 09/11/2020 including CMP, lipid panel, iron and anemia panel, vitamin D, B12,  A1c, and insulin level, were reviewed today.   Exercise goals: Will start going to the gym.  Behavioral modification strategies: increasing lean protein intake, decreasing simple carbohydrates, increasing vegetables, increasing water intake, decreasing eating out, no skipping meals, meal planning and cooking strategies, keeping healthy foods in the home and planning for success.  Sherry Blair has agreed to follow-up with our clinic in 2 weeks. She was informed of the importance of frequent follow-up visits to maximize her success with intensive lifestyle modifications for her multiple health conditions.   Objective:   Blood pressure 107/74, pulse 72, temperature 98.3 F (36.8 C), height 5\' 3"  (1.6 m), weight 202 lb (91.6 kg), SpO2 98 %. Body mass index is 35.78 kg/m.  General: Cooperative, alert, well developed, in no acute distress. HEENT: Conjunctivae and lids unremarkable. Cardiovascular: Regular rhythm.  Lungs: Normal work of breathing. Neurologic: No focal deficits.   Lab Results  Component Value Date   CREATININE 0.59 09/11/2020   BUN 11 09/11/2020   NA 140 09/11/2020   K 4.6 09/11/2020   CL 104 09/11/2020   CO2 20 09/11/2020   Lab Results  Component Value Date   ALT 34 (H) 09/11/2020   AST 32 09/11/2020   ALKPHOS 60 09/11/2020   BILITOT 0.4 09/11/2020   Lab Results  Component Value Date   HGBA1C 5.3 09/11/2020   Lab Results  Component Value Date   INSULIN 5.3 09/11/2020   Lab Results  Component Value Date   TSH 4.270 09/11/2020   Lab Results  Component Value Date   CHOL 197 09/11/2020   HDL 71 09/11/2020   LDLCALC 113 (H) 09/11/2020   TRIG 74 09/11/2020   Lab Results  Component Value Date   WBC 6.2 02/10/2019   HGB 7.7 (A) 09/29/2019   HCT 33.9 (L) 09/11/2020   MCV 56.2 (L) 02/10/2019   PLT 328 02/10/2019   Lab Results  Component Value Date   IRON 69 09/11/2020   TIBC 390 09/11/2020   FERRITIN 37 09/11/2020   Attestation Statements:   Reviewed  by clinician on day of visit: allergies, medications, problem list, medical history, surgical history, family history, social history, and previous encounter notes.  I, Water quality scientist, CMA, am acting as Location manager for [employer], DO  I have reviewed the above documentation for accuracy and completeness, and I agree with the above. Jearld Lesch, DO

## 2020-10-02 NOTE — Addendum Note (Signed)
Addended by: Jaquita Folds on: 10/02/2020 11:48 AM   Modules accepted: Orders

## 2020-10-21 ENCOUNTER — Ambulatory Visit (INDEPENDENT_AMBULATORY_CARE_PROVIDER_SITE_OTHER): Payer: 59 | Admitting: Bariatrics

## 2020-10-29 ENCOUNTER — Encounter: Payer: Self-pay | Admitting: Obstetrics & Gynecology

## 2020-10-29 DIAGNOSIS — Z0289 Encounter for other administrative examinations: Secondary | ICD-10-CM

## 2020-11-28 ENCOUNTER — Telehealth: Payer: Self-pay | Admitting: Obstetrics and Gynecology

## 2020-11-28 ENCOUNTER — Other Ambulatory Visit: Payer: Self-pay | Admitting: Obstetrics and Gynecology

## 2020-11-28 DIAGNOSIS — D259 Leiomyoma of uterus, unspecified: Secondary | ICD-10-CM

## 2020-11-28 NOTE — Telephone Encounter (Signed)
Pt informed that [facility] had been contacted about the referral that was placed in April. Advised that there office should be calling today to schedule an appt for her. Advised if they have not called her by tomorrow to let us know. Pt verbalized understanding.

## 2020-12-02 ENCOUNTER — Ambulatory Visit (INDEPENDENT_AMBULATORY_CARE_PROVIDER_SITE_OTHER): Payer: 59 | Admitting: *Deleted

## 2020-12-02 ENCOUNTER — Other Ambulatory Visit (HOSPITAL_COMMUNITY)
Admission: RE | Admit: 2020-12-02 | Discharge: 2020-12-02 | Disposition: A | Discharge status: Home / Self Care | Payer: 59 | Source: Ambulatory Visit | Attending: Obstetrics and Gynecology | Admitting: Obstetrics and Gynecology

## 2020-12-02 ENCOUNTER — Other Ambulatory Visit: Payer: Self-pay

## 2020-12-02 DIAGNOSIS — R8290 Unspecified abnormal findings in urine: Secondary | ICD-10-CM | POA: Diagnosis not present

## 2020-12-02 DIAGNOSIS — N898 Other specified noninflammatory disorders of vagina: Secondary | ICD-10-CM

## 2020-12-02 LAB — POCT URINALYSIS DIPSTICK
Bilirubin, UA: NEGATIVE
Glucose, UA: NEGATIVE
Nitrite, UA: POSITIVE
Protein, UA: POSITIVE — AB
Spec Grav, UA: >=1.03 — AB (ref 1.010–1.025)
Urobilinogen, UA: 0.2 E.U./dL
pH, UA: 6 (ref 5.0–8.0)

## 2020-12-02 NOTE — Progress Notes (Signed)
Pt is in office for Self swab and urine dip. Pt states she has had increase in vaginal discharge and has an odor to her urine.   Urine dipstick shows positive for nitrites, leukocytes, red blood cells, protein, ketones. Urine Culture sent today.   Pt states she would prefer to wait for results to determine treatment. Pt states no other symptoms at this time.   Advised if symptoms become worse, to contact office for Rx.

## 2020-12-02 NOTE — Progress Notes (Signed)
Agree with A & P. 

## 2020-12-03 ENCOUNTER — Other Ambulatory Visit: Payer: Self-pay

## 2020-12-03 DIAGNOSIS — N760 Acute vaginitis: Secondary | ICD-10-CM

## 2020-12-03 LAB — CERVICOVAGINAL ANCILLARY ONLY
Bacterial Vaginitis (gardnerella): POSITIVE — AB
Candida Glabrata: NEGATIVE
Candida Vaginitis: NEGATIVE
Comment: NEGATIVE
Comment: NEGATIVE
Comment: NEGATIVE

## 2020-12-03 MED ORDER — METRONIDAZOLE 500 MG PO TABS: 500.0000 mg | ORAL_TABLET | Freq: Two times a day (BID) | ORAL | 0 refills | Status: DC

## 2020-12-06 ENCOUNTER — Other Ambulatory Visit: Payer: Self-pay

## 2020-12-06 ENCOUNTER — Telehealth: Payer: Self-pay

## 2020-12-06 LAB — URINE CULTURE

## 2020-12-06 MED ORDER — AMPICILLIN 500 MG PO CAPS: 500.0000 mg | ORAL_CAPSULE | Freq: Three times a day (TID) | ORAL | 0 refills | Status: DC

## 2020-12-06 NOTE — Telephone Encounter (Signed)
-----   Message from Chancy Milroy, MD sent at 12/06/2020 11:29 AM EDT ----- Please send in Rx for Ampicillin 500 mg po tid x7 days for UTI. Pt is aware Thanks Legrand Como

## 2020-12-06 NOTE — Telephone Encounter (Signed)
Results reviewed online, rx sent

## 2020-12-10 ENCOUNTER — Other Ambulatory Visit: Payer: Self-pay

## 2020-12-10 ENCOUNTER — Ambulatory Visit
Admission: RE | Admit: 2020-12-10 | Discharge: 2020-12-10 | Disposition: A | Discharge status: Home / Self Care | Payer: 59 | Source: Ambulatory Visit | Attending: Obstetrics and Gynecology | Admitting: Obstetrics and Gynecology

## 2020-12-10 DIAGNOSIS — D259 Leiomyoma of uterus, unspecified: Secondary | ICD-10-CM

## 2020-12-10 HISTORY — PX: IR RADIOLOGIST EVAL & MGMT: IMG5224

## 2020-12-13 ENCOUNTER — Encounter: Payer: Self-pay | Admitting: *Deleted

## 2020-12-20 NOTE — Consult Note (Signed)
Chief Complaint: Patient was consulted remotely today (TeleHealth) for symptomatic uterine fibroids at the request of Hull N.    Referring Physician(s): Schroeder,Michelle N  History of Present Illness: Sherry Blair is a 50 y.o. G32P2 African American female with known history of uterine fibroids for approximately the last 4 years and associated menorrhagia and bulk symptoms. Uterine fibroids have enlarged by ultrasound between 2018 and 2021 with largest fibroid measuring approximately 10 cm by ultrasound in 2021 and uterine volume of approximately 840 mL at that time. She did require blood transfusions for anemia in 2018 and 2019 and has been on iron replacement since that time. Recent MRI on 09/24/20 shows uterine enlargement with estimated volume of 1050 mL and multiple fibroids, the largest roughly 10 cm in maximum diameter.  Sherry Blair describes monthly menstrual cycles that typically last 7 days with 3 days of heavy bleeding and passage of clots. She has severe cramping and bloating symptoms during her menstrual cycles. Bulk symptoms consist of pelvic pressure and increased urinary frequency. She awakens 1-2 times every night to urinate and frequently urinates during waking hours. She did take [place] in August of 2021 for a month but did not tolerate it due to nausea and nervousness. Pap smear on 09/07/19 was negative. 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 She has had a history of bacterial vaginitis. She does not have any plans for future pregnancy.  Past Medical History:  Diagnosis Date   Anemia    low iron and fibroids   Chronic kidney disease 09/2018   Acute kidney failure   Fibroids    GERD (gastroesophageal reflux disease)    History of ectopic pregnancy 07/20/2009   Infertility, female    Leiomyoma of uterus    Vitamin D deficiency     Past Surgical History:  Procedure Laterality Date    DX LAPAROSCOPY/  EXPLORATORY LAPARTOMY/ RIGHT SALPINGECTOMY/ WEDGE RESECTION FO THE UTERINE CORNU ON THE RIGHT , ECTOPIC PREGNANCY  07-20-2009    dr Delsa Sale  [facility]   GASTRIC BYPASS     IR RADIOLOGIST EVAL & MGMT  12/10/2020    Allergies: Patient has no known allergies.  Medications: Prior to Admission medications   Medication Sig Start Date End Date Taking? Authorizing Provider  ampicillin (PRINCIPEN) 500 MG capsule Take 1 capsule (500 mg total) by mouth 3 (three) times daily. 12/06/20   [name] Bombard, MD  Ascorbic Acid (VITAMIN C) 100 MG tablet Take 100 mg by mouth daily.    [provider]  Biotin 1 MG CAPS Take by mouth.    [provider]  Cholecalciferol (VITAMIN D) 50 MCG (2000 UT) CAPS Take by mouth.    [provider]  ferrous sulfate 325 (65 FE) MG tablet Take 1 tablet (325 mg total) by mouth every other day. 07/25/20   [name] Bombard, MD  ibuprofen (ADVIL) 800 MG tablet TAKE 1 TABLET BY MOUTH EVERY 8 HOURS START BEFORE PERIOD UNTIL PERIOD ENDS, MONTHLY 12/12/19   Florian Buff, MD  metroNIDAZOLE (FLAGYL) 500 MG tablet Take 1 tablet (500 mg total) by mouth 2 (two) times daily. 12/03/20   Chancy Milroy, MD  Vitamin D, Ergocalciferol, (DRISDOL) 1.25 MG (50000 UNIT) CAPS capsule Take 1 capsule (50,000 Units total) by mouth every 7 (seven) days. 09/25/20   Georgia Lopes, DO     Family History  Problem Relation Age of Onset   Hyperlipidemia Mother  Lupus Mother    Breast cancer Mother    Hypertension Father    Diabetes Father    Obesity Father    Hypertension Brother     Social History   Socioeconomic History   Marital status: Single    Spouse name: Not on file   Number of children: Not on file   Years of education: Not on file   Highest education level: Not on file  Occupational History   Occupation: Accountant  Tobacco Use   Smoking status: Never   Smokeless tobacco: Never  Vaping Use   Vaping Use: Never used  Substance and  Sexual Activity   Alcohol use: Yes    Alcohol/week: 0.0 standard drinks    Comment: socially   Drug use: No   Sexual activity: Yes    Partners: Male    Birth control/protection: None  Other Topics Concern   Not on file  Social History Narrative   Not on file   Social Determinants of Health   Financial Resource Strain: Not on file  Food Insecurity: Not on file  Transportation Needs: Not on file  Physical Activity: Not on file  Stress: Not on file  Social Connections: Not on file     Review of Systems  Constitutional: Negative.   Respiratory: Negative.    Cardiovascular: Negative.   Gastrointestinal: Negative.   Genitourinary:  Positive for frequency, menstrual problem, pelvic pain and vaginal bleeding.  Musculoskeletal: Negative.   Neurological: Negative.    Review of Systems: A 12 point ROS discussed and pertinent positives are indicated in the HPI above.  All other systems are negative.  Physical Exam No direct physical exam was performed (except for noted visual exam findings with Video Visits).   Vital Signs: There were no vitals taken for this visit.  Imaging: IR Radiologist Eval & Mgmt  Result Date: 12/13/2020 Please refer to notes tab for details about interventional procedure. (Op Note)   Labs:  CBC: Recent Labs    09/11/20 1124  HCT 33.9*    COAGS: No results for input(s): INR, APTT in the last 8760 hours.  BMP: Recent Labs    09/11/20 1124  NA 140  K 4.6  CL 104  CO2 20  GLUCOSE 89  BUN 11  CALCIUM 8.5*  CREATININE 0.59    LIVER FUNCTION TESTS: Recent Labs    09/11/20 1124  BILITOT 0.4  AST 32  ALT 34*  ALKPHOS 60  PROT 6.6  ALBUMIN 3.7*     Assessment and Plan:  I spoke with Sherry Blair by phone and we discussed options for treatment of her fibroid disease including hysterectomy and uterine artery embolization. She has had some acceleration of her bulk symptoms over the last year or so due to uterine enlargement. In  reviewing her MRI myself, there are at least 10-12 total fibroids with a dominant posterior fundal 10 cm intramural fibroid. All fibroids enhance after contrast administration. I estimate uterine volume as 1175 mL by measurements. There are no pedunculated fibroids that would be of poor morphology for embolization.  Sherry Blair is at the point of wanting to pursue a therapeutic procedure given her symptoms and failure to tolerate hormonal therapy. She is interested in fibroid embolization as she has had friends and family members have successful outcomes with embolization in the [place] area where she is originally from. She would prefer a less invasive procedure currently and would prefer to proceed with embolization at this time over hysterectomy. I did  outline the advantage of hysterectomy being more definitive, but embolization being less invasive and having a short recovery time.  After discussion, Sherry Blair would like to proceed with the scheduling process. We will begin the authorization process.  Thank you for this interesting consult.  I greatly enjoyed meeting Sherry Blair and look forward to participating in their care.  A copy of this report was sent to the requesting provider on this date.  Electronically Signed: Azzie Roup 12/20/2020, 4:18 PM    I spent a total of  30 Minutes   in remote  clinical consultation, greater than 50% of which was counseling/coordinating care for symptomatic uterine fibroids.    Visit type: Audio only (telephone). Audio (no video) only due to patient's lack of internet/smartphone capability. Alternative for in-person consultation at [facility] [address] [place], [place], [place]. This visit type was conducted due to national recommendations for restrictions regarding the COVID-19 Pandemic (e.g. social distancing).  This format is felt to be most appropriate for this patient at this time.  All issues noted in this  document were discussed and addressed.

## 2021-02-06 ENCOUNTER — Other Ambulatory Visit (HOSPITAL_COMMUNITY): Payer: Self-pay | Admitting: Interventional Radiology

## 2021-02-06 DIAGNOSIS — D259 Leiomyoma of uterus, unspecified: Secondary | ICD-10-CM

## 2021-02-27 ENCOUNTER — Other Ambulatory Visit (HOSPITAL_COMMUNITY): Payer: 59

## 2021-03-05 ENCOUNTER — Other Ambulatory Visit: Payer: Self-pay | Admitting: Physician Assistant

## 2021-03-05 ENCOUNTER — Other Ambulatory Visit: Payer: Self-pay | Admitting: Student

## 2021-03-05 ENCOUNTER — Other Ambulatory Visit: Payer: Self-pay

## 2021-03-06 ENCOUNTER — Other Ambulatory Visit (HOSPITAL_COMMUNITY): Payer: Self-pay | Admitting: Interventional Radiology

## 2021-03-06 ENCOUNTER — Other Ambulatory Visit: Payer: Self-pay

## 2021-03-06 ENCOUNTER — Ambulatory Visit (HOSPITAL_COMMUNITY)
Admission: RE | Admit: 2021-03-06 | Discharge: 2021-03-06 | Disposition: A | Discharge status: Home / Self Care | Payer: 59 | Source: Ambulatory Visit | Attending: Interventional Radiology | Admitting: Interventional Radiology

## 2021-03-06 ENCOUNTER — Observation Stay (HOSPITAL_COMMUNITY)
Admission: RE | Admit: 2021-03-06 | Discharge: 2021-03-07 | Disposition: A | Discharge status: Home / Self Care | Payer: 59 | Source: Ambulatory Visit | Attending: Interventional Radiology | Admitting: Interventional Radiology

## 2021-03-06 ENCOUNTER — Encounter (HOSPITAL_COMMUNITY): Payer: Self-pay

## 2021-03-06 DIAGNOSIS — D259 Leiomyoma of uterus, unspecified: Secondary | ICD-10-CM

## 2021-03-06 DIAGNOSIS — Z79899 Other long term (current) drug therapy: Secondary | ICD-10-CM | POA: Diagnosis not present

## 2021-03-06 DIAGNOSIS — D631 Anemia in chronic kidney disease: Secondary | ICD-10-CM | POA: Insufficient documentation

## 2021-03-06 DIAGNOSIS — N189 Chronic kidney disease, unspecified: Secondary | ICD-10-CM | POA: Diagnosis not present

## 2021-03-06 HISTORY — PX: IR ANGIOGRAM SELECTIVE EACH ADDITIONAL VESSEL: IMG667

## 2021-03-06 HISTORY — PX: IR US GUIDE VASC ACCESS RIGHT: IMG2390

## 2021-03-06 HISTORY — PX: IR 3D INDEPENDENT WKST: IMG2385

## 2021-03-06 HISTORY — PX: IR EMBO TUMOR ORGAN ISCHEMIA INFARCT INC GUIDE ROADMAPPING: IMG5449

## 2021-03-06 HISTORY — PX: IR ANGIOGRAM PELVIS SELECTIVE OR SUPRASELECTIVE: IMG661

## 2021-03-06 LAB — SARS CORONAVIRUS 2 (TAT 6-24 HRS): SARS Coronavirus 2: NEGATIVE

## 2021-03-06 LAB — CBC WITH DIFFERENTIAL/PLATELET
Abs Immature Granulocytes: 0.01 10*3/uL (ref 0.00–0.07)
Basophils Absolute: 0 10*3/uL (ref 0.0–0.1)
Basophils Relative: 1 %
Eosinophils Absolute: 0.1 10*3/uL (ref 0.0–0.5)
Eosinophils Relative: 1 %
HCT: 30.9 % — ABNORMAL LOW (ref 36.0–46.0)
Hemoglobin: 8.6 g/dL — ABNORMAL LOW (ref 12.0–15.0)
Immature Granulocytes: 0 %
Lymphocytes Relative: 33 %
Lymphs Abs: 1.8 10*3/uL (ref 0.7–4.0)
MCH: 17.3 pg — ABNORMAL LOW (ref 26.0–34.0)
MCHC: 27.8 g/dL — ABNORMAL LOW (ref 30.0–36.0)
MCV: 62.2 fL — ABNORMAL LOW (ref 80.0–100.0)
Monocytes Absolute: 0.5 10*3/uL (ref 0.1–1.0)
Monocytes Relative: 8 %
Neutro Abs: 3.1 10*3/uL (ref 1.7–7.7)
Neutrophils Relative %: 57 %
Platelets: 328 10*3/uL (ref 150–400)
RBC: 4.97 MIL/uL (ref 3.87–5.11)
RDW: 21.3 % — ABNORMAL HIGH (ref 11.5–15.5)
WBC: 5.4 10*3/uL (ref 4.0–10.5)
nRBC: 0 % (ref 0.0–0.2)

## 2021-03-06 LAB — BASIC METABOLIC PANEL
Anion gap: 6 (ref 5–15)
BUN: 17 mg/dL (ref 6–20)
CO2: 25 mmol/L (ref 22–32)
Calcium: 8.4 mg/dL — ABNORMAL LOW (ref 8.9–10.3)
Chloride: 107 mmol/L (ref 98–111)
Creatinine, Ser: 0.59 mg/dL (ref 0.44–1.00)
GFR, Estimated: >60 mL/min (ref >60)
Glucose, Bld: 93 mg/dL (ref 70–99)
Potassium: 4 mmol/L (ref 3.5–5.1)
Sodium: 138 mmol/L (ref 135–145)

## 2021-03-06 LAB — HCG, SERUM, QUALITATIVE: Preg, Serum: NEGATIVE

## 2021-03-06 LAB — PROTIME-INR
INR: 0.9 (ref 0.8–1.2)
Prothrombin Time: 12.6 seconds (ref 11.4–15.2)

## 2021-03-06 IMAGING — XA IR EMBO TUMOR ORGAN ISCHEMIA INFARCT INC GUIDE ROADMAPPING
3 series · 14 of 24 positions shown · non-contrast
Comparison: none

CLINICAL DATA: Symptomatic uterine fibroids with menorrhagia and
bulk symptoms. The patient presents for elective uterine fibroid
embolization.

[Series 1: processed: ir embo tumor organ ischemia  · 7 acquisitions, 9 frames shown]
[im 1/7]
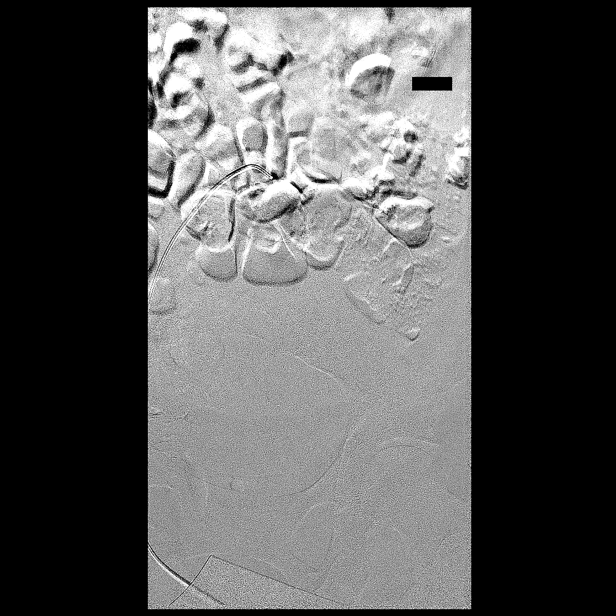
[im 1/7]
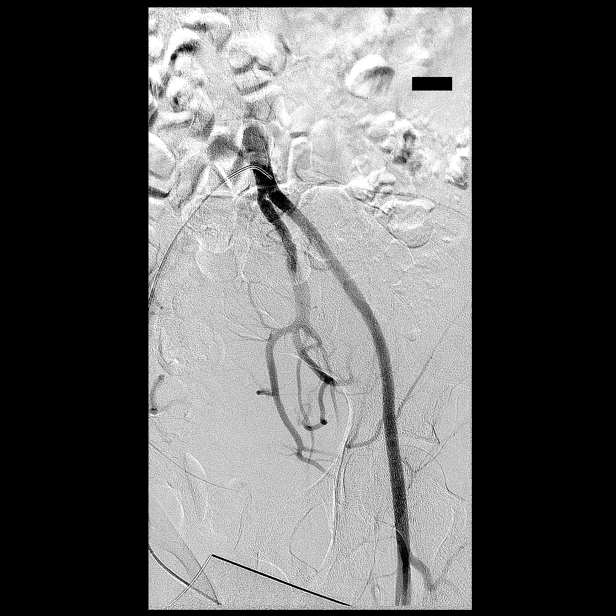
[im 3/7]
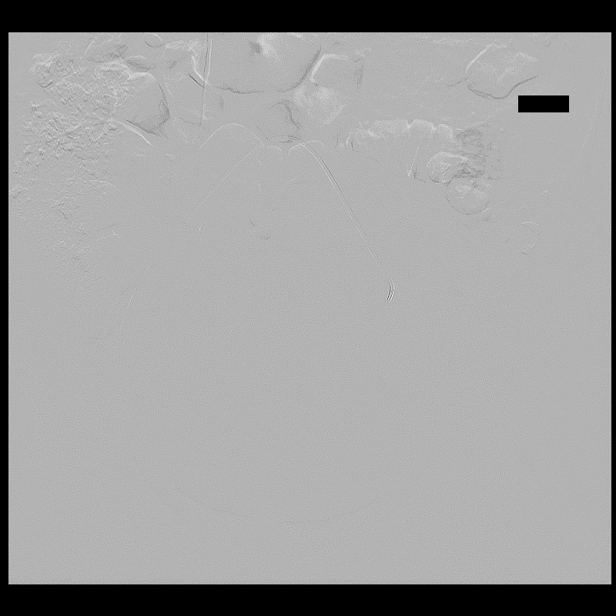
[im 3/7]
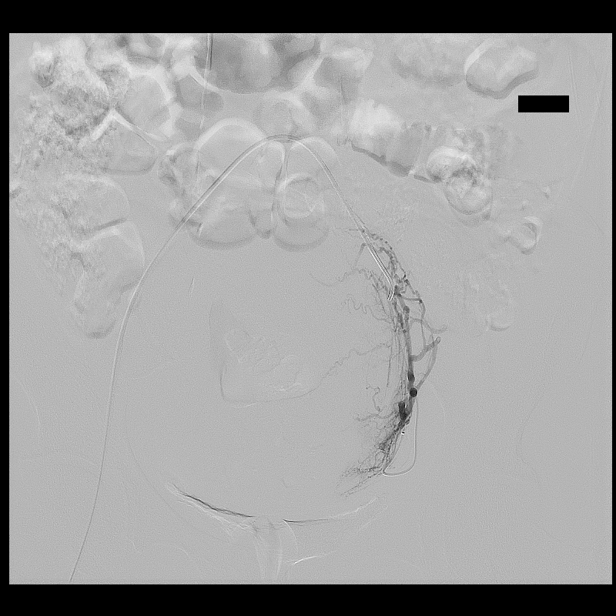
[im 4/7]
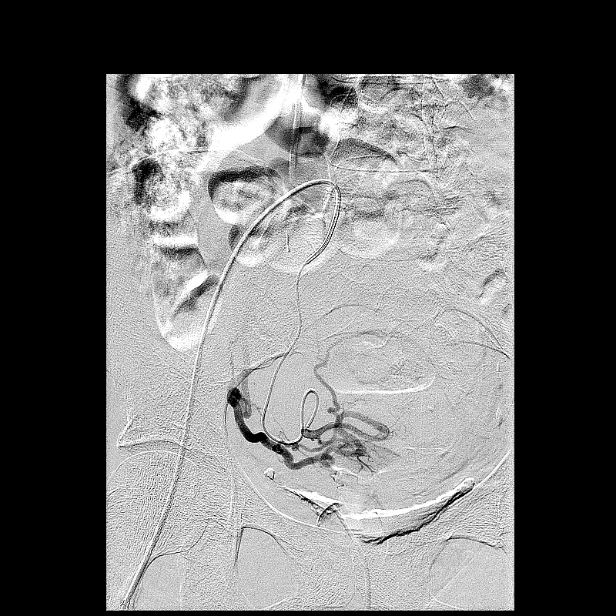
[im 5/7]
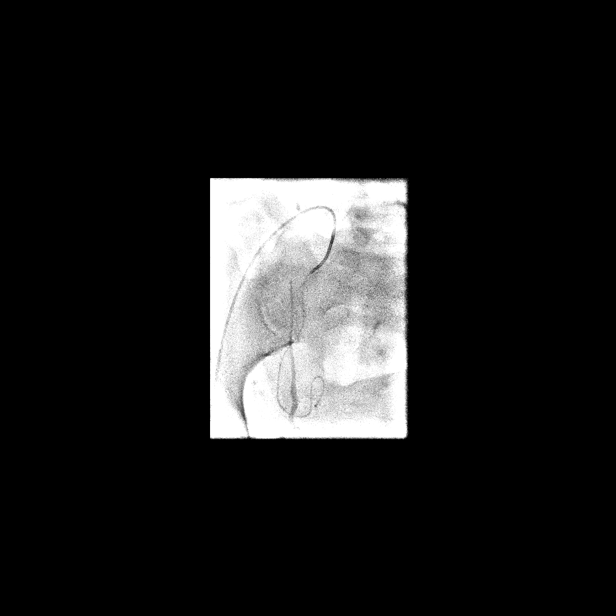
[im 6/7]
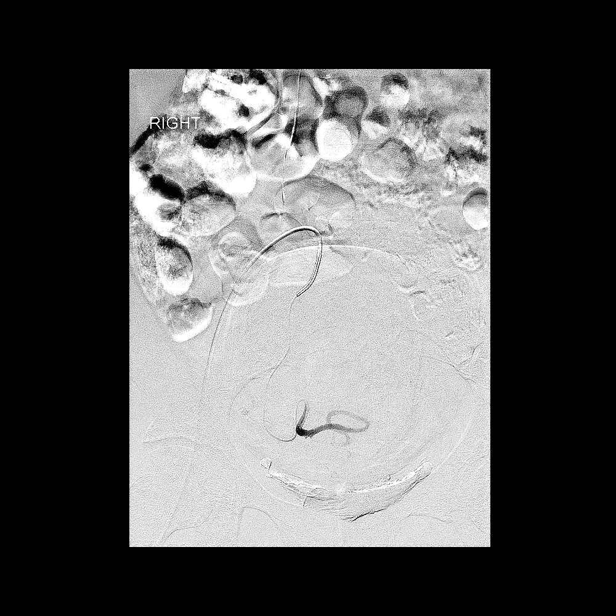
[im 6/7]
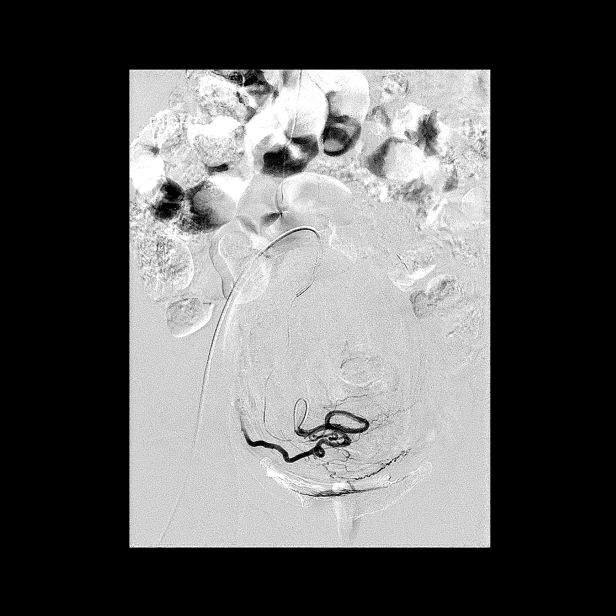
[im 7/7]
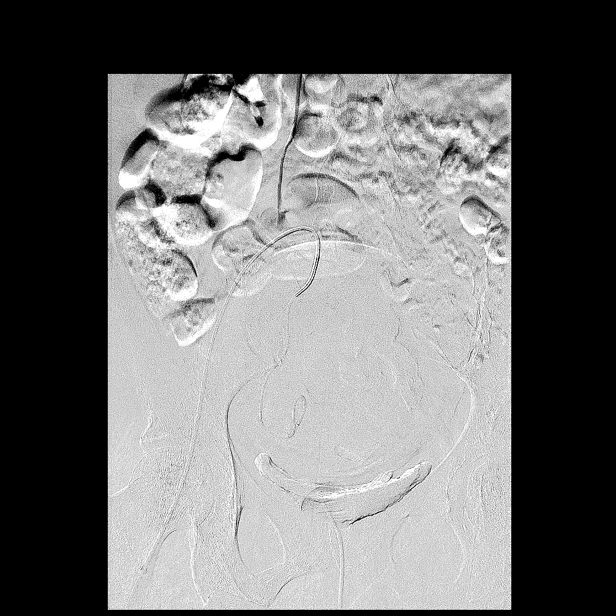

[Series 1: ir (id) (id)/(id)/(id) · 1 of 1 slices shown]
[im 1/1]
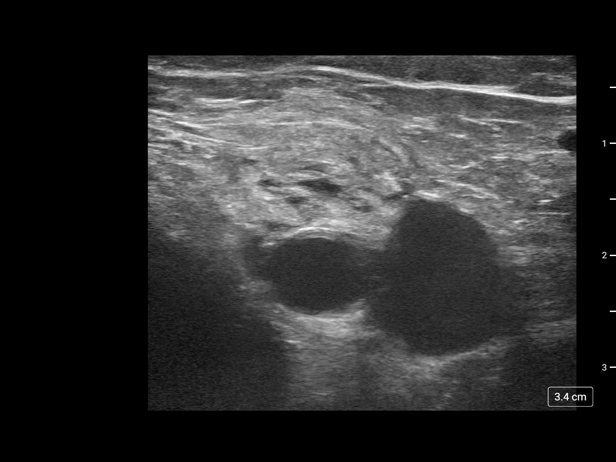

[Series 1: ir embo tumor organ ischemia infarct inc · 3 acquisitions, 4 frames shown]
[im 1/3]
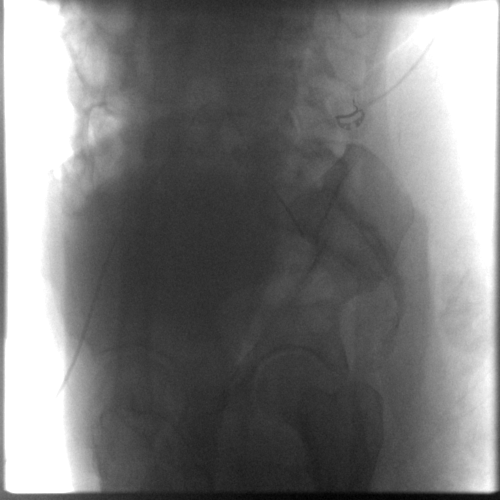
[im 2/3]
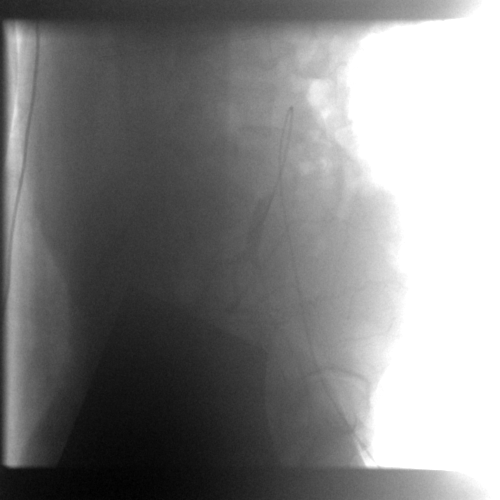
[im 3/3]
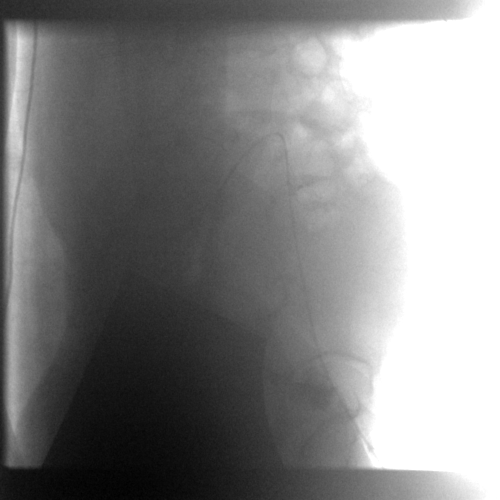
[im 3/3]
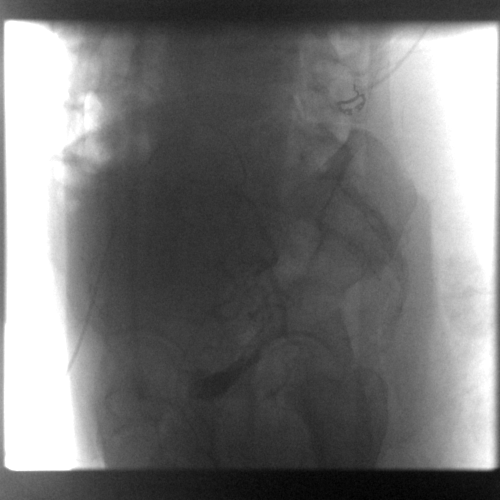

[14 of 24 positions shown; findings below may reference images not displayed]

EXAM:
1. ULTRASOUND GUIDANCE FOR VASCULAR ACCESS OF THE RIGHT COMMON
FEMORAL ARTERY
2. SELECTIVE PELVIC ARTERIOGRAPHY OF BILATERAL INTERNAL ILIAC
ARTERIES
3. THREE-DIMENSIONAL RECONSTRUCTION OF ROTATIONAL ARTERIOGRAPHY
UTILIZING CONE BEAM CT AND INDEPENDENT WORKSTATION
4. SELECTIVE ARTERIOGRAPHY OF BILATERAL UTERINE ARTERIES
5. TRANSCATHETER EMBOLIZATION BILATERAL UTERINE ARTERIES FOR
EMBOLIZATION OF THE UTERUS

ANESTHESIA/SEDATION:
Moderate (conscious) sedation was employed during this procedure. A
total of Versed 6.0 mg and Fentanyl 200 mcg was administered
intravenously.

Moderate Sedation Time: 105 minutes. The patient's level of
consciousness and vital signs were monitored continuously by
radiology nursing throughout the procedure under my direct
supervision.

MEDICATIONS:
2 g IV Ancef, 30 mg IV Toradol

FLUOROSCOPY TIME:  28 minutes.  [ET] mGy.

CONTRAST:  175 mL Omnipaque 300

PROCEDURE:
The procedure, risks, benefits, and alternatives were explained to
the patient. Questions regarding the procedure were encouraged and
answered. The patient understands and consents to the procedure. A
time-out was performed prior to initiating the procedure.

The right groin was prepped with chlorhexidine in a sterile fashion,
and a sterile drape was applied covering the operative field. A
sterile gown and sterile gloves were used for the procedure. Local
anesthesia was provided with 1% Lidocaine. After small skin
incision, a 21 gauge needle was advanced into the right common
femoral artery under direct ultrasound guidance. Ultrasound image
documentation was performed. After establishing guide wire access, a
5-French sheath was placed.

A 5 Fr diagnostic catheter was advanced over a guidewire into the
distal abdominal aorta. The catheter was then used to selectively
catheterize the left common iliac artery followed by the left
internal iliac artery. Selective pelvic arteriography was performed
of the left internal iliac artery utilizing a rotational angiography
acquisition. The rotational data was then reconstructed into a
three-dimensional model and CT model on an independent workstation.
This 3-D model was utilized to localize and catheterize the left
uterine artery.

A 2.8 Fr coaxial microcatheter was then introduced through the
diagnostic catheter and used to selectively catheterize the left
uterine artery. Selective arteriography of the left uterine artery
was performed through the microcatheter.

Left uterine artery embolization was then performed with
installation of microsphere particles. Follow-up arteriography was
performed after embolization.

The microcatheter was removed. The diagnostic catheter was then
retracted and used to selectively catheterize the right internal
iliac artery. Selective pelvic arteriography was performed of the
right internal iliac artery utilizing a rotational angiography
acquisition. The rotational data was then reconstructed into a
three-dimensional model and CT model on an independent workstation.
This 3-D model was utilized to localize and catheterize the right
uterine artery.

The microcatheter was then reintroduced and used to selectively
catheterize the right uterine artery. Selective arteriography of the
right uterine artery was then performed through the microcatheter.
Right uterine artery embolization was then performed with
installation of microsphere particles. Follow-up arteriography was
performed after embolization.

Right femoral arteriotomy hemostasis: Cordis ExoSeal device

COMPLICATIONS:
None.
FINDINGS: Bilateral uterine arteriography shows multiple enlarged
hypervascular trunks supplying uterine fibroids.

Left uterine artery embolization was performed utilizing [REDACTED]
vials of 500-700 micron sized Embosphere particles. Completion
arteriography demonstrates adequate occlusion of branches supplying
uterine fibroids.

Right uterine artery embolization was performed utilizing [REDACTED]
vials of 500-700 micron sized and [REDACTED] vials of 700-900 micron
sized Embosphere particles. Completion arteriography demonstrates
adequate occlusion of branches supplying uterine fibroids.

Adequate hemostasis was achieved at the femoral arteriotomy site.
IMPRESSION: Successful bilateral uterine artery embolization to treat
symptomatic uterine fibroid disease. Adequate occlusion of branch
vessels supplying uterine fibroids was achieved with microsphere
particle embolization. The patient was admitted for overnight
observation for treatment of post embolization symptoms.

## 2021-03-06 IMAGING — XA IR EMBO TUMOR ORGAN ISCHEMIA INFARCT INC GUIDE ROADMAPPING
4 series · 12 of 24 positions shown · non-contrast
Comparison: none

CLINICAL DATA: Symptomatic uterine fibroids with menorrhagia and
bulk symptoms. The patient presents for elective uterine fibroid
embolization.

[Series 2: vision 2 · 2 of 27 frames shown (1 of 4)]
[frame 9/27]
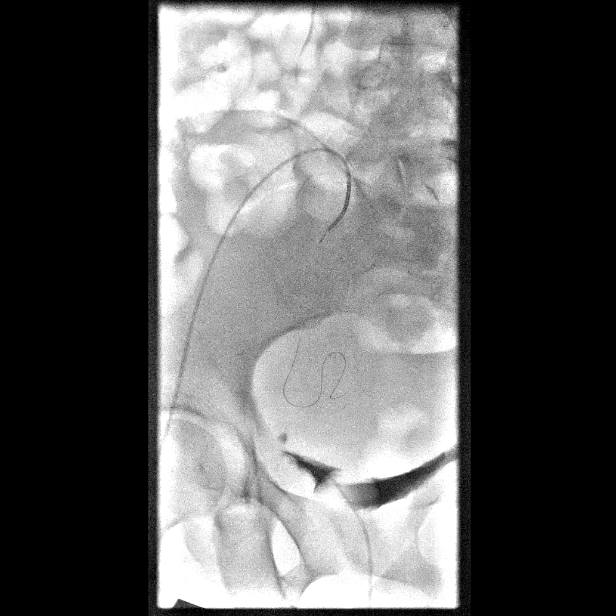
[frame 23/27]
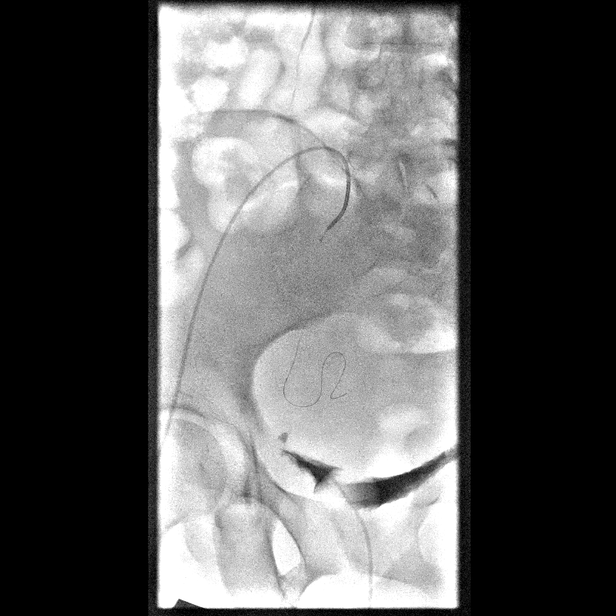

[Series 3: vision 2 · 4 of 27 slices shown (2 of 4)]
[im 6/27]
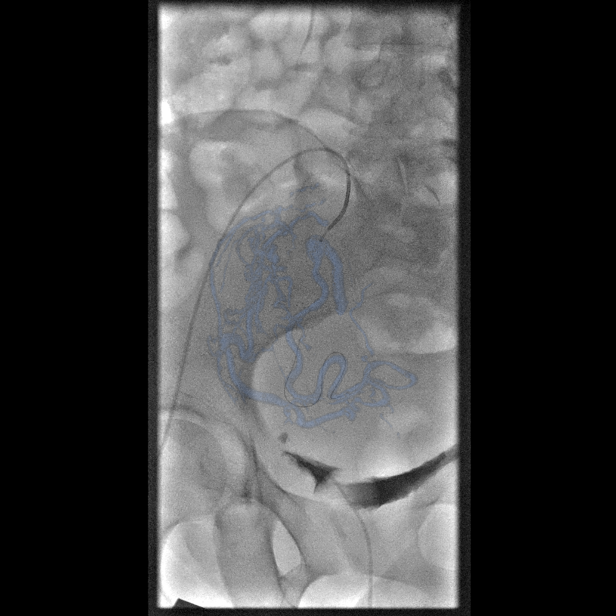
[im 11/27]
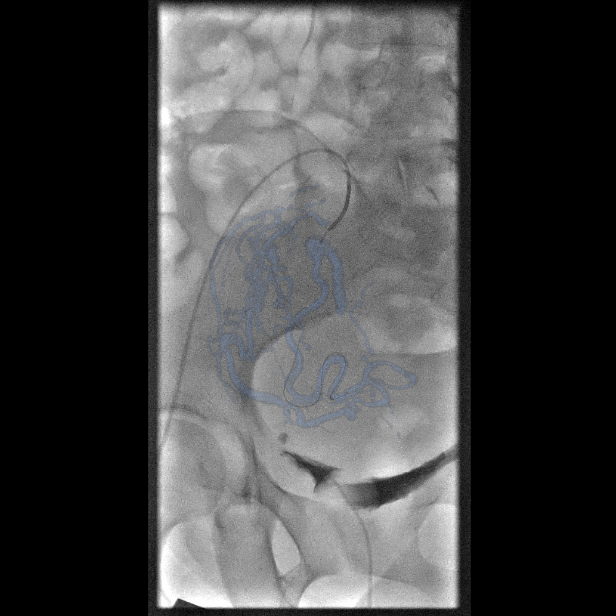
[im 16/27]
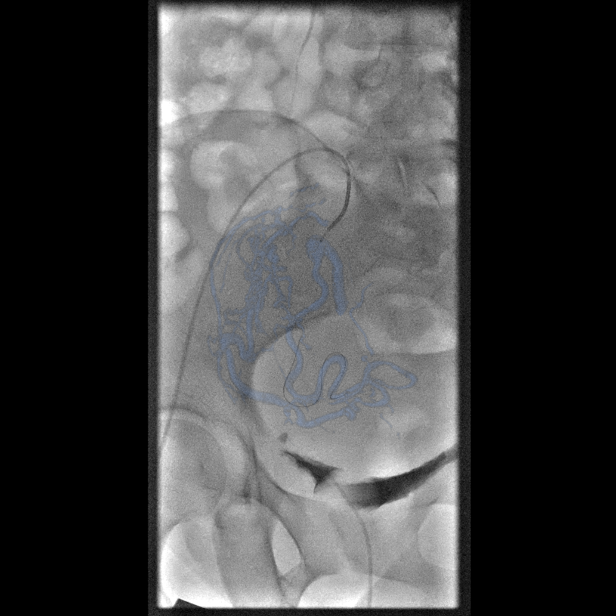
[im 21/27]
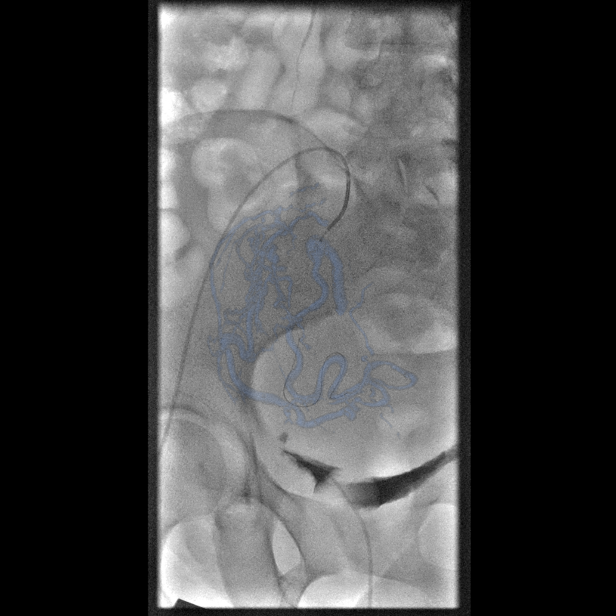

[Series 4: vision 2 · 1 of 1 slices shown (3 of 4)]
[im 1/1]
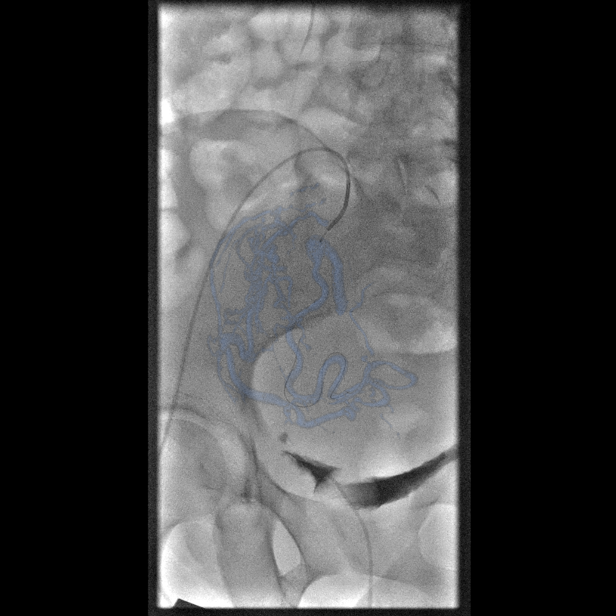

[Series 5: vision 2 · 5 of 27 slices shown (4 of 4)]
[im 3/27]
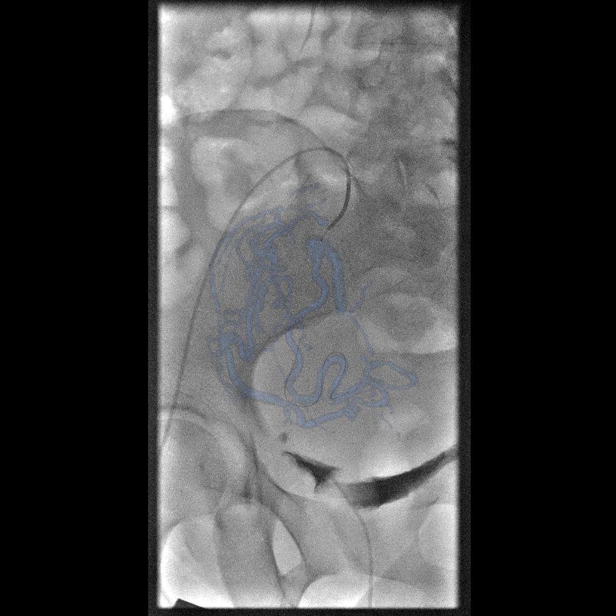
[im 8/27]
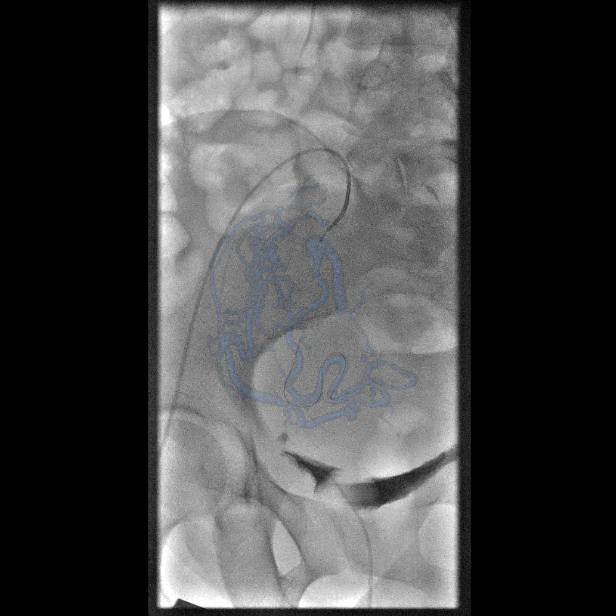
[im 14/27]
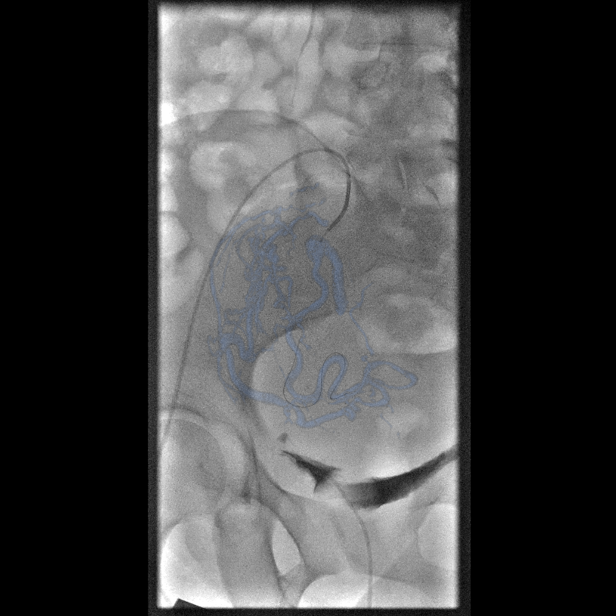
[im 21/27]
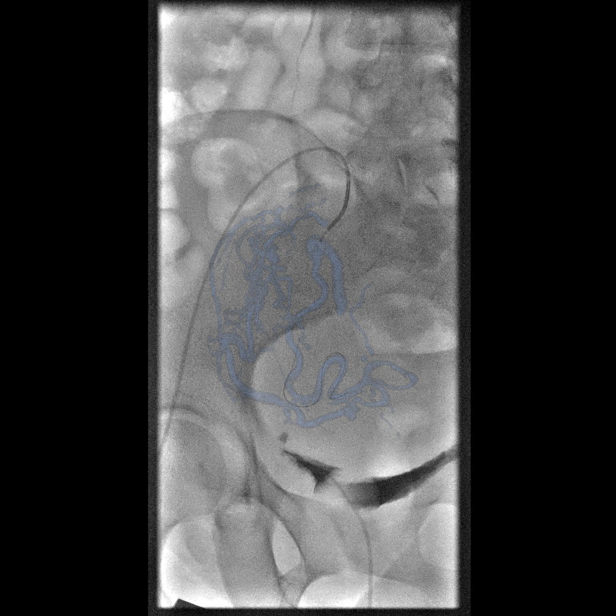
[im 27/27]
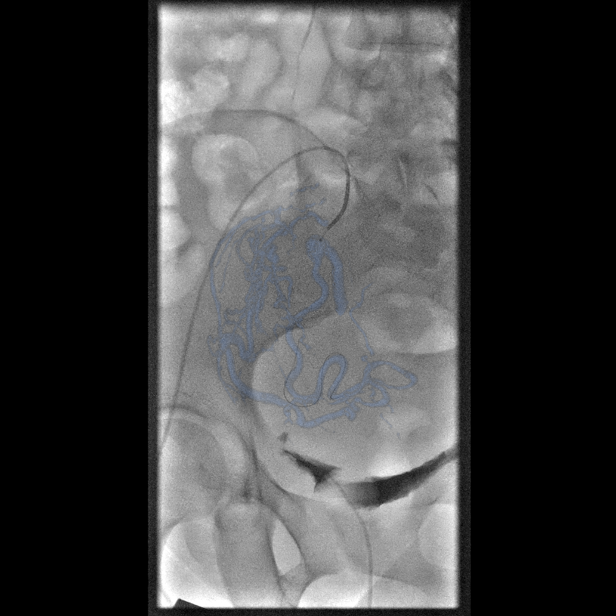

[12 of 24 positions shown; findings below may reference images not displayed]

EXAM:
1. ULTRASOUND GUIDANCE FOR VASCULAR ACCESS OF THE RIGHT COMMON
FEMORAL ARTERY
2. SELECTIVE PELVIC ARTERIOGRAPHY OF BILATERAL INTERNAL ILIAC
ARTERIES
3. THREE-DIMENSIONAL RECONSTRUCTION OF ROTATIONAL ARTERIOGRAPHY
UTILIZING CONE BEAM CT AND INDEPENDENT WORKSTATION
4. SELECTIVE ARTERIOGRAPHY OF BILATERAL UTERINE ARTERIES
5. TRANSCATHETER EMBOLIZATION BILATERAL UTERINE ARTERIES FOR
EMBOLIZATION OF THE UTERUS

ANESTHESIA/SEDATION:
Moderate (conscious) sedation was employed during this procedure. A
total of Versed 6.0 mg and Fentanyl 200 mcg was administered
intravenously.

Moderate Sedation Time: 105 minutes. The patient's level of
consciousness and vital signs were monitored continuously by
radiology nursing throughout the procedure under my direct
supervision.

MEDICATIONS:
2 g IV Ancef, 30 mg IV Toradol

FLUOROSCOPY TIME:  28 minutes.  [ET] mGy.

CONTRAST:  175 mL Omnipaque 300

PROCEDURE:
The procedure, risks, benefits, and alternatives were explained to
the patient. Questions regarding the procedure were encouraged and
answered. The patient understands and consents to the procedure. A
time-out was performed prior to initiating the procedure.

The right groin was prepped with chlorhexidine in a sterile fashion,
and a sterile drape was applied covering the operative field. A
sterile gown and sterile gloves were used for the procedure. Local
anesthesia was provided with 1% Lidocaine. After small skin
incision, a 21 gauge needle was advanced into the right common
femoral artery under direct ultrasound guidance. Ultrasound image
documentation was performed. After establishing guide wire access, a
5-French sheath was placed.

A 5 Fr diagnostic catheter was advanced over a guidewire into the
distal abdominal aorta. The catheter was then used to selectively
catheterize the left common iliac artery followed by the left
internal iliac artery. Selective pelvic arteriography was performed
of the left internal iliac artery utilizing a rotational angiography
acquisition. The rotational data was then reconstructed into a
three-dimensional model and CT model on an independent workstation.
This 3-D model was utilized to localize and catheterize the left
uterine artery.

A 2.8 Fr coaxial microcatheter was then introduced through the
diagnostic catheter and used to selectively catheterize the left
uterine artery. Selective arteriography of the left uterine artery
was performed through the microcatheter.

Left uterine artery embolization was then performed with
installation of microsphere particles. Follow-up arteriography was
performed after embolization.

The microcatheter was removed. The diagnostic catheter was then
retracted and used to selectively catheterize the right internal
iliac artery. Selective pelvic arteriography was performed of the
right internal iliac artery utilizing a rotational angiography
acquisition. The rotational data was then reconstructed into a
three-dimensional model and CT model on an independent workstation.
This 3-D model was utilized to localize and catheterize the right
uterine artery.

The microcatheter was then reintroduced and used to selectively
catheterize the right uterine artery. Selective arteriography of the
right uterine artery was then performed through the microcatheter.
Right uterine artery embolization was then performed with
installation of microsphere particles. Follow-up arteriography was
performed after embolization.

Right femoral arteriotomy hemostasis: Cordis ExoSeal device

COMPLICATIONS:
None.
FINDINGS: Bilateral uterine arteriography shows multiple enlarged
hypervascular trunks supplying uterine fibroids.

Left uterine artery embolization was performed utilizing [REDACTED]
vials of 500-700 micron sized Embosphere particles. Completion
arteriography demonstrates adequate occlusion of branches supplying
uterine fibroids.

Right uterine artery embolization was performed utilizing [REDACTED]
vials of 500-700 micron sized and [REDACTED] vials of 700-900 micron
sized Embosphere particles. Completion arteriography demonstrates
adequate occlusion of branches supplying uterine fibroids.

Adequate hemostasis was achieved at the femoral arteriotomy site.
IMPRESSION: Successful bilateral uterine artery embolization to treat
symptomatic uterine fibroid disease. Adequate occlusion of branch
vessels supplying uterine fibroids was achieved with microsphere
particle embolization. The patient was admitted for overnight
observation for treatment of post embolization symptoms.

## 2021-03-06 MED ORDER — LIDOCAINE HCL 1 % IJ SOLN
INTRAMUSCULAR | Status: AC
  Filled 2021-03-06: qty 20

## 2021-03-06 MED ORDER — MIDAZOLAM HCL 2 MG/2ML IJ SOLN
INTRAMUSCULAR | Status: DC | PRN
  Administered 2021-03-06: 1 mg via INTRAVENOUS

## 2021-03-06 MED ORDER — FENTANYL CITRATE (PF) 100 MCG/2ML IJ SOLN
INTRAMUSCULAR | Status: DC | PRN
  Administered 2021-03-06: 50 ug via INTRAVENOUS

## 2021-03-06 MED ORDER — SODIUM CHLORIDE 0.9 % IV SOLN: INTRAVENOUS | Status: DC

## 2021-03-06 MED ORDER — IOHEXOL 300 MG/ML  SOLN
100.0000 mL | Freq: Once | INTRAMUSCULAR | Status: AC | PRN
  Administered 2021-03-06: 70 mL via INTRA_ARTERIAL

## 2021-03-06 MED ORDER — LIDOCAINE HCL (PF) 1 % IJ SOLN
INTRAMUSCULAR | Status: DC | PRN
  Administered 2021-03-06: 10 mL via INTRADERMAL

## 2021-03-06 MED ORDER — PROMETHAZINE HCL 25 MG PO TABS: 25.0000 mg | ORAL_TABLET | Freq: Three times a day (TID) | ORAL | Status: DC | PRN

## 2021-03-06 MED ORDER — HYDROMORPHONE 1 MG/ML IV SOLN
INTRAVENOUS | Status: DC
Start: 2021-03-06 — End: 2021-03-07
  Administered 2021-03-06: 2.4 mg via INTRAVENOUS
  Administered 2021-03-07: 0.3 mg via INTRAVENOUS
  Administered 2021-03-07: 1.5 mg via INTRAVENOUS
  Administered 2021-03-07: 0.3 mg via INTRAVENOUS
  Administered 2021-03-07: 1.2 mg via INTRAVENOUS
  Filled 2021-03-06 (×11): qty 30

## 2021-03-06 MED ORDER — SODIUM CHLORIDE 0.9% FLUSH: 9.0000 mL | INTRAVENOUS | Status: DC | PRN

## 2021-03-06 MED ORDER — DIPHENHYDRAMINE HCL 50 MG/ML IJ SOLN: 12.5000 mg | Freq: Four times a day (QID) | INTRAMUSCULAR | Status: DC | PRN

## 2021-03-06 MED ORDER — IOHEXOL 300 MG/ML  SOLN
50.0000 mL | Freq: Once | INTRAMUSCULAR | Status: AC | PRN
  Administered 2021-03-06: 35 mL via INTRA_ARTERIAL

## 2021-03-06 MED ORDER — SODIUM CHLORIDE 0.9% FLUSH: 3.0000 mL | Freq: Two times a day (BID) | INTRAVENOUS | Status: DC

## 2021-03-06 MED ORDER — SODIUM CHLORIDE 0.9 % IV SOLN: 250.0000 mL | INTRAVENOUS | Status: DC | PRN

## 2021-03-06 MED ORDER — SODIUM CHLORIDE 0.9% FLUSH: 3.0000 mL | INTRAVENOUS | Status: DC | PRN

## 2021-03-06 MED ORDER — ONDANSETRON HCL 4 MG/2ML IJ SOLN: 4.0000 mg | Freq: Four times a day (QID) | INTRAMUSCULAR | Status: DC | PRN

## 2021-03-06 MED ORDER — KETOROLAC TROMETHAMINE 30 MG/ML IJ SOLN
INTRAMUSCULAR | Status: AC
  Administered 2021-03-06: 30 mg via INTRAVENOUS
  Filled 2021-03-06: qty 1

## 2021-03-06 MED ORDER — KETOROLAC TROMETHAMINE 30 MG/ML IJ SOLN
30.0000 mg | Freq: Four times a day (QID) | INTRAMUSCULAR | Status: DC
  Administered 2021-03-06 – 2021-03-07 (×3): 30 mg via INTRAVENOUS
  Filled 2021-03-06 (×3): qty 1

## 2021-03-06 MED ORDER — MIDAZOLAM HCL 2 MG/2ML IJ SOLN
INTRAMUSCULAR | Status: AC
  Filled 2021-03-06: qty 6

## 2021-03-06 MED ORDER — CEFAZOLIN SODIUM-DEXTROSE 2-4 GM/100ML-% IV SOLN: 2.0000 g | INTRAVENOUS | Status: AC

## 2021-03-06 MED ORDER — DOCUSATE SODIUM 100 MG PO CAPS
100.0000 mg | ORAL_CAPSULE | Freq: Two times a day (BID) | ORAL | Status: DC
  Administered 2021-03-06 – 2021-03-07 (×2): 100 mg via ORAL
  Filled 2021-03-06 (×2): qty 1

## 2021-03-06 MED ORDER — CEFAZOLIN SODIUM-DEXTROSE 2-4 GM/100ML-% IV SOLN
INTRAVENOUS | Status: AC
  Administered 2021-03-06: 2 g via INTRAVENOUS
  Filled 2021-03-06: qty 100

## 2021-03-06 MED ORDER — PROMETHAZINE HCL 25 MG RE SUPP
25.0000 mg | Freq: Three times a day (TID) | RECTAL | Status: DC | PRN
  Filled 2021-03-06: qty 1

## 2021-03-06 MED ORDER — DIPHENHYDRAMINE HCL 12.5 MG/5ML PO ELIX
12.5000 mg | ORAL_SOLUTION | Freq: Four times a day (QID) | ORAL | Status: DC | PRN
  Filled 2021-03-06: qty 5

## 2021-03-06 MED ORDER — FENTANYL CITRATE (PF) 100 MCG/2ML IJ SOLN
INTRAMUSCULAR | Status: AC
  Filled 2021-03-06: qty 4

## 2021-03-06 MED ORDER — ONDANSETRON HCL 4 MG/2ML IJ SOLN
4.0000 mg | Freq: Four times a day (QID) | INTRAMUSCULAR | Status: DC | PRN
  Administered 2021-03-07: 4 mg via INTRAVENOUS
  Filled 2021-03-06: qty 2

## 2021-03-06 MED ORDER — SODIUM CHLORIDE 0.9 % IV SOLN: INTRAVENOUS | Status: AC

## 2021-03-06 MED ORDER — HYDROCODONE-ACETAMINOPHEN 5-325 MG PO TABS: 1.0000 | ORAL_TABLET | ORAL | Status: DC | PRN

## 2021-03-06 MED ORDER — KETOROLAC TROMETHAMINE 30 MG/ML IJ SOLN: 30.0000 mg | Freq: Once | INTRAMUSCULAR | Status: AC

## 2021-03-06 MED ORDER — NALOXONE HCL 0.4 MG/ML IJ SOLN: 0.4000 mg | INTRAMUSCULAR | Status: DC | PRN

## 2021-03-06 NOTE — H&P (Signed)
Referring Physician(s): Schroeder,M  Supervising Physician: Aletta Edouard  Patient Status:  [facility] OP TBA  Chief Complaint: Symptomatic uterine fibroids   Subjective: Patient familiar to IR service from consultation with Dr. Kathlene Cote on 12/10/2020 to discuss treatment options for symptomatic uterine fibroids.  She was deemed an appropriate candidate for bilateral uterine artery embolization and presents today for the procedure.  She currently denies fever, headache, chest pain, dyspnea, cough, back pain, nausea, vomiting.  She does have some intermittent pubic tenderness/cramping, urinary frequency and history of menorrhagia.  Past Medical History:  Diagnosis Date   Anemia    low iron and fibroids   Chronic kidney disease 09/2018   Acute kidney failure   Fibroids    GERD (gastroesophageal reflux disease)    History of ectopic pregnancy 07/20/2009   Infertility, female    Leiomyoma of uterus    Vitamin D deficiency       Allergies: Patient has no known allergies.  Medications: Prior to Admission medications   Medication Sig Start Date End Date Taking? Authorizing Provider  ampicillin (PRINCIPEN) 500 MG capsule Take 1 capsule (500 mg total) by mouth 3 (three) times daily. 12/06/20   [name] Bombard, MD  Ascorbic Acid (VITAMIN C) 100 MG tablet Take 100 mg by mouth daily.    [provider]  Biotin 1 MG CAPS Take by mouth.    [provider]  Cholecalciferol (VITAMIN D) 50 MCG (2000 UT) CAPS Take by mouth.    [provider]  ferrous sulfate 325 (65 FE) MG tablet Take 1 tablet (325 mg total) by mouth every other day. 07/25/20   [name] Bombard, MD  ibuprofen (ADVIL) 800 MG tablet TAKE 1 TABLET BY MOUTH EVERY 8 HOURS START BEFORE PERIOD UNTIL PERIOD ENDS, MONTHLY 12/12/19   Florian Buff, MD  metroNIDAZOLE (FLAGYL) 500 MG tablet Take 1 tablet (500 mg total) by mouth 2 (two) times daily. 12/03/20   Chancy Milroy, MD  Vitamin D, Ergocalciferol,  (DRISDOL) 1.25 MG (50000 UNIT) CAPS capsule Take 1 capsule (50,000 Units total) by mouth every 7 (seven) days. 09/25/20   Georgia Lopes, DO     Vital Signs:pending    Physical Exam awake/alert; chest- CTA bilat; heart- RRR;' abd- soft,+BS, fibroid uterus with mild palpable tenderness suprapubic region; no LE edema  Imaging: No results found.  Labs:  CBC: Recent Labs    09/11/20 1124  HCT 33.9*    COAGS: No results for input(s): INR, APTT in the last 8760 hours.  BMP: Recent Labs    09/11/20 1124  NA 140  K 4.6  CL 104  CO2 20  GLUCOSE 89  BUN 11  CALCIUM 8.5*  CREATININE 0.59    LIVER FUNCTION TESTS: Recent Labs    09/11/20 1124  BILITOT 0.4  AST 32  ALT 34*  ALKPHOS 60  PROT 6.6  ALBUMIN 3.7*    Assessment and Plan: Patient with PMH sig for anemia, GERD, CKD, vit D deficiency; she is familiar to IR service from consultation with Dr. Kathlene Cote on 12/10/2020 to discuss treatment options for symptomatic uterine fibroids.  She was deemed an appropriate candidate for bilateral uterine artery embolization and presents today for the procedure. Risks and benefits of procedure were discussed with the patient including, but not limited to bleeding, infection, vascular injury or contrast induced renal failure.  This interventional procedure involves the use of X-rays and because of the nature of the planned procedure, it is possible that  we will have prolonged use of X-ray fluoroscopy.  Potential radiation risks to you include (but are not limited to) the following: - A slightly elevated risk for cancer  several years later in life. This risk is typically less than 0.5% percent. This risk is low in comparison to the normal incidence of human cancer, which is 33% for women and 50% for men according to the [place]. - Radiation induced injury can include skin redness, resembling a rash, tissue breakdown / ulcers and hair loss (which can be temporary or  permanent).   The likelihood of either of these occurring depends on the difficulty of the procedure and whether you are sensitive to radiation due to previous procedures, disease, or genetic conditions.   IF your procedure requires a prolonged use of radiation, you will be notified and given written instructions for further action.  It is your responsibility to monitor the irradiated area for the 2 weeks following the procedure and to notify your physician if you are concerned that you have suffered a radiation induced injury.    All of the patient's questions were answered, patient is agreeable to proceed.  Consent signed and in chart.      Electronically Signed: D. Rowe Robert, PA-C 03/06/2021, 12:31 PM   I spent a total of 30 minutes at the the patient's bedside AND on the patient's hospital floor or unit, greater than 50% of which was counseling/coordinating care for bilateral uterine artery embolization

## 2021-03-06 NOTE — Procedures (Signed)
Interventional Radiology Procedure Note  Procedure: Uterine fibroid embolization  Complications: None  Estimated Blood Loss: < 10 mL  Findings: Bilateral pelvic and uterine arteriography followed by uterine artery embolization with Embosphere particles bilaterally. Good stasis in distal branches supplying fibroids.   Plan: Overnight observation.  Venetia Night. Kathlene Cote, M.D Pager:  [phone]

## 2021-03-07 DIAGNOSIS — D259 Leiomyoma of uterus, unspecified: Secondary | ICD-10-CM | POA: Diagnosis not present

## 2021-03-07 MED ORDER — PROMETHAZINE HCL 25 MG PO TABS: 25.0000 mg | ORAL_TABLET | Freq: Three times a day (TID) | ORAL | 0 refills | Status: AC | PRN

## 2021-03-07 MED ORDER — CHLORHEXIDINE GLUCONATE CLOTH 2 % EX PADS: 6.0000 | MEDICATED_PAD | Freq: Every day | CUTANEOUS | Status: DC

## 2021-03-07 MED ORDER — HYDROCODONE-ACETAMINOPHEN 5-325 MG PO TABS: 1.0000 | ORAL_TABLET | ORAL | 0 refills | Status: AC | PRN

## 2021-03-07 NOTE — Progress Notes (Signed)
Discharge instructions discussed with patient and family, verbalized agreement and understanding 

## 2021-03-07 NOTE — Discharge Instructions (Signed)
No strenuous activity for 3-5 days.  No sexual activity for 1 week.  Keep procedure site clean and dry, changing bandage approximately 1 time per day for 2-3 days.  Radiology clinic will contact you regarding follow up visit in 3-4 weeks.  Call [phone] with questions/concerns

## 2021-03-07 NOTE — Discharge Summary (Signed)
Patient ID: Sherry Blair MRN: [id number] DOB/AGE: 1970/11/01 50 y.o.  Admit date: 03/06/2021 Discharge date: 03/07/2021  Supervising Physician: Aletta Edouard  Patient Status: [facility] - In-pt  Admission Diagnoses: Uterine Fibroid  Discharge Diagnoses:  Principal Problem:   Fibroid uterus Active Problems:   Uterine fibroid   Discharged Condition: good  Hospital Course: Ms. Sherry Blair presented for planned uterine embolization in the setting of a uterine fibroid.  She underwent planned with procedure without incident and was admitted for overnight observation which was uneventful.  Pain has been well managed, she is ambulating well, and plans to void before leaving today   Treatments: bilateral uterine artery embolization  Discharge Exam: Blood pressure (!) 135/58, pulse 62, temperature 98.6 F (37 C), temperature source Oral, resp. rate 18, height '5\' 3"'$  (1.6 m), weight 200 lb (90.7 kg), last menstrual period 02/20/2021, SpO2 100 %.  Sherry Blair is sitting up in bed in NAD.  She voices no current pain.  Bandaged over incision site in right groin is clean and dry.  No SOB, CP, abd pain, weakness, or difficulty ambulating  Disposition: Discharge disposition: 01-Home or Self Care      Discharge Instructions     Call MD for:  difficulty breathing, headache or visual disturbances   Complete by: As directed    Call MD for:  hives   Complete by: As directed    Call MD for:  persistant dizziness or light-headedness   Complete by: As directed    Call MD for:  persistant nausea and vomiting   Complete by: As directed    Call MD for:  redness, tenderness, or signs of infection (pain, swelling, redness, odor or green/yellow discharge around incision site)   Complete by: As directed    Call MD for:  severe uncontrolled pain   Complete by: As directed    Call MD for:  temperature >100.4   Complete by: As directed    Change dressing (specify)   Complete by: As  directed    Dressing change: 1 times per day using bandage.  Keep site clean and dry.   Diet - low sodium heart healthy   Complete by: As directed    Driving Restrictions   Complete by: As directed    No Driving for 24 hours, or after taking narcotics   Increase activity slowly   Complete by: As directed    Lifting restrictions   Complete by: As directed    No heavy lifting for 3 days   May shower / Bathe   Complete by: As directed    Sexual Activity Restrictions   Complete by: As directed    Avoid sexual activity for 1 week      Allergies as of 03/07/2021   No Known Allergies      Medication List     TAKE these medications    ampicillin 500 MG capsule Commonly known as: PRINCIPEN Take 1 capsule (500 mg total) by mouth 3 (three) times daily.   Biotin 1 MG Caps Take by mouth.   ferrous sulfate 325 (65 FE) MG tablet Take 1 tablet (325 mg total) by mouth every other day.   HYDROcodone-acetaminophen 5-325 MG tablet Commonly known as: NORCO/VICODIN Take 1-2 tablets by mouth every 4 (four) hours as needed for moderate pain.   ibuprofen 800 MG tablet Commonly known as: ADVIL TAKE 1 TABLET BY MOUTH EVERY 8 HOURS START BEFORE PERIOD UNTIL PERIOD ENDS, MONTHLY   metroNIDAZOLE 500 MG tablet Commonly  known as: FLAGYL Take 1 tablet (500 mg total) by mouth 2 (two) times daily.   promethazine 25 MG tablet Commonly known as: PHENERGAN Take 1 tablet (25 mg total) by mouth every 8 (eight) hours as needed for nausea.   vitamin C 100 MG tablet Take 100 mg by mouth daily.   Vitamin D (Ergocalciferol) 1.25 MG (50000 UNIT) Caps capsule Commonly known as: DRISDOL Take 1 capsule (50,000 Units total) by mouth every 7 (seven) days.   Vitamin D 50 MCG (2000 UT) Caps Take by mouth.               Discharge Care Instructions  (From admission, onward)           Start     Ordered   03/07/21 0000  Change dressing (specify)       Comments: Dressing change: 1 times per  day using bandage.  Keep site clean and dry.   03/07/21 1357              Electronically Signed: Pasty Spillers, PA 03/07/2021, 2:10 PM   I have spent Less Than 30 Minutes discharging Sherry Blair.

## 2021-03-07 NOTE — Progress Notes (Signed)
   03/07/21 1500  Mobility  Activity Refused mobility   Pt walked in hallway with husband, being d/c.    [place] Office: [phone]

## 2021-03-09 NOTE — Progress Notes (Signed)
Rt groin incisional dressing CDI without drainage noted.

## 2021-03-09 NOTE — Progress Notes (Signed)
Rt groin incisional bandage CDI w/out drainage.

## 2021-03-09 NOTE — Progress Notes (Signed)
Rt groin incision site, dressing CDI w/ no drainage.

## 2021-03-13 ENCOUNTER — Other Ambulatory Visit: Payer: Self-pay | Admitting: Obstetrics & Gynecology

## 2021-03-13 DIAGNOSIS — N944 Primary dysmenorrhea: Secondary | ICD-10-CM

## 2021-04-03 ENCOUNTER — Other Ambulatory Visit: Payer: Self-pay

## 2021-04-03 ENCOUNTER — Ambulatory Visit
Admission: RE | Admit: 2021-04-03 | Discharge: 2021-04-03 | Disposition: A | Discharge status: Home / Self Care | Payer: 59 | Source: Ambulatory Visit | Attending: Physician Assistant | Admitting: Physician Assistant

## 2021-04-03 DIAGNOSIS — D259 Leiomyoma of uterus, unspecified: Secondary | ICD-10-CM

## 2021-04-03 HISTORY — PX: IR RADIOLOGIST EVAL & MGMT: IMG5224

## 2021-04-03 NOTE — Progress Notes (Signed)
Chief Complaint: Patient was consulted remotely today (TeleHealth) for follow-up after uterine fibroid embolization on 03/06/2021.  History of Present Illness: Sherry Blair is a 50 y.o. female status post uterine fibroid embolization procedure on 03/06/2021. After overnight observation she was discharged home.  She states that she had some pelvic cramping for approximately 2 weeks after the procedure which was bearable after the first week.  She has noted decreased abdominal and pelvic bloating since the procedure and also has had a decrease in her urinary frequency.  She is back to full activity and appetite is normal.  She had some spotting after the procedure and passage of a small amount of tissue.  She was due for a menstrual cycle last week but has not had a cycle since the procedure.  Past Medical History:  Diagnosis Date   Anemia    low iron and fibroids   Chronic kidney disease 09/2018   Acute kidney failure   Fibroids    GERD (gastroesophageal reflux disease)    History of ectopic pregnancy 07/20/2009   Infertility, female    Leiomyoma of uterus    Vitamin D deficiency     Past Surgical History:  Procedure Laterality Date   DX LAPAROSCOPY/  EXPLORATORY LAPARTOMY/ RIGHT SALPINGECTOMY/ WEDGE RESECTION FO THE UTERINE CORNU ON THE RIGHT , ECTOPIC PREGNANCY  07-20-2009    dr Delsa Sale  [facility]   GASTRIC BYPASS     IR [place]  03/06/2021   IR ANGIOGRAM PELVIS SELECTIVE OR SUPRASELECTIVE  03/06/2021   IR ANGIOGRAM SELECTIVE EACH ADDITIONAL VESSEL  03/06/2021   IR ANGIOGRAM SELECTIVE EACH ADDITIONAL VESSEL  03/06/2021   IR EMBO TUMOR ORGAN ISCHEMIA INFARCT INC GUIDE ROADMAPPING  03/06/2021   IR RADIOLOGIST EVAL & MGMT  12/10/2020   IR US GUIDE VASC ACCESS RIGHT  03/06/2021    Allergies: Patient has no known allergies.  Medications: Prior to Admission medications   Medication Sig Start Date End Date Taking? Authorizing Provider  ampicillin (PRINCIPEN) 500  MG capsule Take 1 capsule (500 mg total) by mouth 3 (three) times daily. Patient not taking: Reported on 03/06/2021 12/06/20   [name] Bombard, MD  Ascorbic Acid (VITAMIN C) 100 MG tablet Take 100 mg by mouth daily.    [provider]  Biotin 1 MG CAPS Take by mouth.    [provider]  Cholecalciferol (VITAMIN D) 50 MCG (2000 UT) CAPS Take by mouth.    [provider]  ferrous sulfate 325 (65 FE) MG tablet Take 1 tablet (325 mg total) by mouth every other day. 07/25/20   [name] Bombard, MD  HYDROcodone-acetaminophen (NORCO/VICODIN) 5-325 MG tablet Take 1-2 tablets by mouth every 4 (four) hours as needed for moderate pain. 03/07/21   Boisseau, Hayley, PA  ibuprofen (ADVIL) 800 MG tablet TAKE 1 TABLET BY MOUTH EVERY 8 HOURS START BEFORE PERIOD UNTIL PERIOD ENDS, MONTHLY 03/14/21   Florian Buff, MD  metroNIDAZOLE (FLAGYL) 500 MG tablet Take 1 tablet (500 mg total) by mouth 2 (two) times daily. Patient not taking: Reported on 03/06/2021 12/03/20   Chancy Milroy, MD  promethazine (PHENERGAN) 25 MG tablet Take 1 tablet (25 mg total) by mouth every 8 (eight) hours as needed for nausea. 03/07/21   Boisseau, Angie Fava, PA  Vitamin D, Ergocalciferol, (DRISDOL) 1.25 MG (50000 UNIT) CAPS capsule Take 1 capsule (50,000 Units total) by mouth every 7 (seven) days. 09/25/20   Georgia Lopes, DO     Family  History  Problem Relation Age of Onset   Hyperlipidemia Mother    Lupus Mother    Breast cancer Mother    Hypertension Father    Diabetes Father    Obesity Father    Hypertension Brother     Social History   Socioeconomic History   Marital status: Single    Spouse name: Not on file   Number of children: Not on file   Years of education: Not on file   Highest education level: Not on file  Occupational History   Occupation: Accountant  Tobacco Use   Smoking status: Never   Smokeless tobacco: Never  Vaping Use   Vaping Use: Never used  Substance and Sexual Activity    Alcohol use: Yes    Alcohol/week: 0.0 standard drinks    Comment: socially   Drug use: No   Sexual activity: Yes    Partners: Male    Birth control/protection: None  Other Topics Concern   Not on file  Social History Narrative   Not on file   Social Determinants of Health   Financial Resource Strain: Not on file  Food Insecurity: Not on file  Transportation Needs: Not on file  Physical Activity: Not on file  Stress: Not on file  Social Connections: Not on file    Review of Systems  Constitutional: Negative.   Respiratory: Negative.    Cardiovascular: Negative.   Genitourinary: Negative.   Neurological: Negative.    Review of Systems: A 12 point ROS discussed and pertinent positives are indicated in the HPI above.  All other systems are negative.  Physical Exam No direct physical exam was performed (except for noted visual exam findings with Video Visits).   Vital Signs: There were no vitals taken for this visit.  Imaging: IR Angiogram Pelvis Selective Or Supraselective  Result Date: 03/07/2021 CLINICAL DATA:  Symptomatic uterine fibroids with menorrhagia and bulk symptoms. The patient presents for elective uterine fibroid embolization. EXAM: 1. ULTRASOUND GUIDANCE FOR VASCULAR ACCESS OF THE RIGHT COMMON FEMORAL ARTERY 2. SELECTIVE PELVIC ARTERIOGRAPHY OF BILATERAL INTERNAL ILIAC ARTERIES 3. THREE-DIMENSIONAL RECONSTRUCTION OF ROTATIONAL ARTERIOGRAPHY UTILIZING CONE BEAM CT AND INDEPENDENT WORKSTATION 4. SELECTIVE ARTERIOGRAPHY OF BILATERAL UTERINE ARTERIES 5. TRANSCATHETER EMBOLIZATION BILATERAL UTERINE ARTERIES FOR EMBOLIZATION OF THE UTERUS ANESTHESIA/SEDATION: Moderate (conscious) sedation was employed during this procedure. A total of Versed 6.0 mg and Fentanyl 200 mcg was administered intravenously. Moderate Sedation Time: 105 minutes. The patient's level of consciousness and vital signs were monitored continuously by radiology nursing throughout the procedure under my  direct supervision. MEDICATIONS: 2 g IV Ancef, 30 mg IV Toradol FLUOROSCOPY TIME:  28 minutes.  1411 mGy. CONTRAST:  175 mL Omnipaque 300 PROCEDURE: The procedure, risks, benefits, and alternatives were explained to the patient. Questions regarding the procedure were encouraged and answered. The patient understands and consents to the procedure. A time-out was performed prior to initiating the procedure. The right groin was prepped with chlorhexidine in a sterile fashion, and a sterile drape was applied covering the operative field. A sterile gown and sterile gloves were used for the procedure. Local anesthesia was provided with 1% Lidocaine. After small skin incision, a 21 gauge needle was advanced into the right common femoral artery under direct ultrasound guidance. Ultrasound image documentation was performed. After establishing guide wire access, a 5-French sheath was placed. A 5 Fr diagnostic catheter was advanced over a guidewire into the distal abdominal aorta. The catheter was then used to selectively catheterize the left common iliac artery  followed by the left internal iliac artery. Selective pelvic arteriography was performed of the left internal iliac artery utilizing a rotational angiography acquisition. The rotational data was then reconstructed into a three-dimensional model and CT model on an independent workstation. This 3-D model was utilized to localize and catheterize the left uterine artery. A 2.8 Fr coaxial microcatheter was then introduced through the diagnostic catheter and used to selectively catheterize the left uterine artery. Selective arteriography of the left uterine artery was performed through the microcatheter. Left uterine artery embolization was then performed with installation of microsphere particles. Follow-up arteriography was performed after embolization. The microcatheter was removed. The diagnostic catheter was then retracted and used to selectively catheterize the right  internal iliac artery. Selective pelvic arteriography was performed of the right internal iliac artery utilizing a rotational angiography acquisition. The rotational data was then reconstructed into a three-dimensional model and CT model on an independent workstation. This 3-D model was utilized to localize and catheterize the right uterine artery. The microcatheter was then reintroduced and used to selectively catheterize the right uterine artery. Selective arteriography of the right uterine artery was then performed through the microcatheter. Right uterine artery embolization was then performed with installation of microsphere particles. Follow-up arteriography was performed after embolization. Right femoral arteriotomy hemostasis: Cordis ExoSeal device COMPLICATIONS: None. FINDINGS: Bilateral uterine arteriography shows multiple enlarged hypervascular trunks supplying uterine fibroids. Left uterine artery embolization was performed utilizing 1 and 3/4 vials of 500-700 micron sized Embosphere particles. Completion arteriography demonstrates adequate occlusion of branches supplying uterine fibroids. Right uterine artery embolization was performed utilizing 2 and 1/4 vials of 500-700 micron sized and 1 and 3/4 vials of 700-900 micron sized Embosphere particles. Completion arteriography demonstrates adequate occlusion of branches supplying uterine fibroids. Adequate hemostasis was achieved at the femoral arteriotomy site. IMPRESSION: Successful bilateral uterine artery embolization to treat symptomatic uterine fibroid disease. Adequate occlusion of branch vessels supplying uterine fibroids was achieved with microsphere particle embolization. The patient was admitted for overnight observation for treatment of post embolization symptoms. Electronically Signed   By: Aletta Edouard M.D.   On: 03/07/2021 13:30   IR Angiogram Selective Each Additional Vessel  Result Date: 03/07/2021 CLINICAL DATA:  Symptomatic uterine  fibroids with menorrhagia and bulk symptoms. The patient presents for elective uterine fibroid embolization. EXAM: 1. ULTRASOUND GUIDANCE FOR VASCULAR ACCESS OF THE RIGHT COMMON FEMORAL ARTERY 2. SELECTIVE PELVIC ARTERIOGRAPHY OF BILATERAL INTERNAL ILIAC ARTERIES 3. THREE-DIMENSIONAL RECONSTRUCTION OF ROTATIONAL ARTERIOGRAPHY UTILIZING CONE BEAM CT AND INDEPENDENT WORKSTATION 4. SELECTIVE ARTERIOGRAPHY OF BILATERAL UTERINE ARTERIES 5. TRANSCATHETER EMBOLIZATION BILATERAL UTERINE ARTERIES FOR EMBOLIZATION OF THE UTERUS ANESTHESIA/SEDATION: Moderate (conscious) sedation was employed during this procedure. A total of Versed 6.0 mg and Fentanyl 200 mcg was administered intravenously. Moderate Sedation Time: 105 minutes. The patient's level of consciousness and vital signs were monitored continuously by radiology nursing throughout the procedure under my direct supervision. MEDICATIONS: 2 g IV Ancef, 30 mg IV Toradol FLUOROSCOPY TIME:  28 minutes.  1411 mGy. CONTRAST:  175 mL Omnipaque 300 PROCEDURE: The procedure, risks, benefits, and alternatives were explained to the patient. Questions regarding the procedure were encouraged and answered. The patient understands and consents to the procedure. A time-out was performed prior to initiating the procedure. The right groin was prepped with chlorhexidine in a sterile fashion, and a sterile drape was applied covering the operative field. A sterile gown and sterile gloves were used for the procedure. Local anesthesia was provided with 1% Lidocaine. After small skin incision, a 21  gauge needle was advanced into the right common femoral artery under direct ultrasound guidance. Ultrasound image documentation was performed. After establishing guide wire access, a 5-French sheath was placed. A 5 Fr diagnostic catheter was advanced over a guidewire into the distal abdominal aorta. The catheter was then used to selectively catheterize the left common iliac artery followed by the left  internal iliac artery. Selective pelvic arteriography was performed of the left internal iliac artery utilizing a rotational angiography acquisition. The rotational data was then reconstructed into a three-dimensional model and CT model on an independent workstation. This 3-D model was utilized to localize and catheterize the left uterine artery. A 2.8 Fr coaxial microcatheter was then introduced through the diagnostic catheter and used to selectively catheterize the left uterine artery. Selective arteriography of the left uterine artery was performed through the microcatheter. Left uterine artery embolization was then performed with installation of microsphere particles. Follow-up arteriography was performed after embolization. The microcatheter was removed. The diagnostic catheter was then retracted and used to selectively catheterize the right internal iliac artery. Selective pelvic arteriography was performed of the right internal iliac artery utilizing a rotational angiography acquisition. The rotational data was then reconstructed into a three-dimensional model and CT model on an independent workstation. This 3-D model was utilized to localize and catheterize the right uterine artery. The microcatheter was then reintroduced and used to selectively catheterize the right uterine artery. Selective arteriography of the right uterine artery was then performed through the microcatheter. Right uterine artery embolization was then performed with installation of microsphere particles. Follow-up arteriography was performed after embolization. Right femoral arteriotomy hemostasis: Cordis ExoSeal device COMPLICATIONS: None. FINDINGS: Bilateral uterine arteriography shows multiple enlarged hypervascular trunks supplying uterine fibroids. Left uterine artery embolization was performed utilizing 1 and 3/4 vials of 500-700 micron sized Embosphere particles. Completion arteriography demonstrates adequate occlusion of branches  supplying uterine fibroids. Right uterine artery embolization was performed utilizing 2 and 1/4 vials of 500-700 micron sized and 1 and 3/4 vials of 700-900 micron sized Embosphere particles. Completion arteriography demonstrates adequate occlusion of branches supplying uterine fibroids. Adequate hemostasis was achieved at the femoral arteriotomy site. IMPRESSION: Successful bilateral uterine artery embolization to treat symptomatic uterine fibroid disease. Adequate occlusion of branch vessels supplying uterine fibroids was achieved with microsphere particle embolization. The patient was admitted for overnight observation for treatment of post embolization symptoms. Electronically Signed   By: Aletta Edouard M.D.   On: 03/07/2021 13:30   IR Angiogram Selective Each Additional Vessel  Result Date: 03/07/2021 CLINICAL DATA:  Symptomatic uterine fibroids with menorrhagia and bulk symptoms. The patient presents for elective uterine fibroid embolization. EXAM: 1. ULTRASOUND GUIDANCE FOR VASCULAR ACCESS OF THE RIGHT COMMON FEMORAL ARTERY 2. SELECTIVE PELVIC ARTERIOGRAPHY OF BILATERAL INTERNAL ILIAC ARTERIES 3. THREE-DIMENSIONAL RECONSTRUCTION OF ROTATIONAL ARTERIOGRAPHY UTILIZING CONE BEAM CT AND INDEPENDENT WORKSTATION 4. SELECTIVE ARTERIOGRAPHY OF BILATERAL UTERINE ARTERIES 5. TRANSCATHETER EMBOLIZATION BILATERAL UTERINE ARTERIES FOR EMBOLIZATION OF THE UTERUS ANESTHESIA/SEDATION: Moderate (conscious) sedation was employed during this procedure. A total of Versed 6.0 mg and Fentanyl 200 mcg was administered intravenously. Moderate Sedation Time: 105 minutes. The patient's level of consciousness and vital signs were monitored continuously by radiology nursing throughout the procedure under my direct supervision. MEDICATIONS: 2 g IV Ancef, 30 mg IV Toradol FLUOROSCOPY TIME:  28 minutes.  1411 mGy. CONTRAST:  175 mL Omnipaque 300 PROCEDURE: The procedure, risks, benefits, and alternatives were explained to the  patient. Questions regarding the procedure were encouraged and answered. The patient understands and  consents to the procedure. A time-out was performed prior to initiating the procedure. The right groin was prepped with chlorhexidine in a sterile fashion, and a sterile drape was applied covering the operative field. A sterile gown and sterile gloves were used for the procedure. Local anesthesia was provided with 1% Lidocaine. After small skin incision, a 21 gauge needle was advanced into the right common femoral artery under direct ultrasound guidance. Ultrasound image documentation was performed. After establishing guide wire access, a 5-French sheath was placed. A 5 Fr diagnostic catheter was advanced over a guidewire into the distal abdominal aorta. The catheter was then used to selectively catheterize the left common iliac artery followed by the left internal iliac artery. Selective pelvic arteriography was performed of the left internal iliac artery utilizing a rotational angiography acquisition. The rotational data was then reconstructed into a three-dimensional model and CT model on an independent workstation. This 3-D model was utilized to localize and catheterize the left uterine artery. A 2.8 Fr coaxial microcatheter was then introduced through the diagnostic catheter and used to selectively catheterize the left uterine artery. Selective arteriography of the left uterine artery was performed through the microcatheter. Left uterine artery embolization was then performed with installation of microsphere particles. Follow-up arteriography was performed after embolization. The microcatheter was removed. The diagnostic catheter was then retracted and used to selectively catheterize the right internal iliac artery. Selective pelvic arteriography was performed of the right internal iliac artery utilizing a rotational angiography acquisition. The rotational data was then reconstructed into a three-dimensional  model and CT model on an independent workstation. This 3-D model was utilized to localize and catheterize the right uterine artery. The microcatheter was then reintroduced and used to selectively catheterize the right uterine artery. Selective arteriography of the right uterine artery was then performed through the microcatheter. Right uterine artery embolization was then performed with installation of microsphere particles. Follow-up arteriography was performed after embolization. Right femoral arteriotomy hemostasis: Cordis ExoSeal device COMPLICATIONS: None. FINDINGS: Bilateral uterine arteriography shows multiple enlarged hypervascular trunks supplying uterine fibroids. Left uterine artery embolization was performed utilizing 1 and 3/4 vials of 500-700 micron sized Embosphere particles. Completion arteriography demonstrates adequate occlusion of branches supplying uterine fibroids. Right uterine artery embolization was performed utilizing 2 and 1/4 vials of 500-700 micron sized and 1 and 3/4 vials of 700-900 micron sized Embosphere particles. Completion arteriography demonstrates adequate occlusion of branches supplying uterine fibroids. Adequate hemostasis was achieved at the femoral arteriotomy site. IMPRESSION: Successful bilateral uterine artery embolization to treat symptomatic uterine fibroid disease. Adequate occlusion of branch vessels supplying uterine fibroids was achieved with microsphere particle embolization. The patient was admitted for overnight observation for treatment of post embolization symptoms. Electronically Signed   By: Aletta Edouard M.D.   On: 03/07/2021 13:30   IR 3D Independent Darreld Mclean  Result Date: 03/07/2021 CLINICAL DATA:  Symptomatic uterine fibroids with menorrhagia and bulk symptoms. The patient presents for elective uterine fibroid embolization. EXAM: 1. ULTRASOUND GUIDANCE FOR VASCULAR ACCESS OF THE RIGHT COMMON FEMORAL ARTERY 2. SELECTIVE PELVIC ARTERIOGRAPHY OF BILATERAL  INTERNAL ILIAC ARTERIES 3. THREE-DIMENSIONAL RECONSTRUCTION OF ROTATIONAL ARTERIOGRAPHY UTILIZING CONE BEAM CT AND INDEPENDENT WORKSTATION 4. SELECTIVE ARTERIOGRAPHY OF BILATERAL UTERINE ARTERIES 5. TRANSCATHETER EMBOLIZATION BILATERAL UTERINE ARTERIES FOR EMBOLIZATION OF THE UTERUS ANESTHESIA/SEDATION: Moderate (conscious) sedation was employed during this procedure. A total of Versed 6.0 mg and Fentanyl 200 mcg was administered intravenously. Moderate Sedation Time: 105 minutes. The patient's level of consciousness and vital signs were monitored continuously by radiology  nursing throughout the procedure under my direct supervision. MEDICATIONS: 2 g IV Ancef, 30 mg IV Toradol FLUOROSCOPY TIME:  28 minutes.  1411 mGy. CONTRAST:  175 mL Omnipaque 300 PROCEDURE: The procedure, risks, benefits, and alternatives were explained to the patient. Questions regarding the procedure were encouraged and answered. The patient understands and consents to the procedure. A time-out was performed prior to initiating the procedure. The right groin was prepped with chlorhexidine in a sterile fashion, and a sterile drape was applied covering the operative field. A sterile gown and sterile gloves were used for the procedure. Local anesthesia was provided with 1% Lidocaine. After small skin incision, a 21 gauge needle was advanced into the right common femoral artery under direct ultrasound guidance. Ultrasound image documentation was performed. After establishing guide wire access, a 5-French sheath was placed. A 5 Fr diagnostic catheter was advanced over a guidewire into the distal abdominal aorta. The catheter was then used to selectively catheterize the left common iliac artery followed by the left internal iliac artery. Selective pelvic arteriography was performed of the left internal iliac artery utilizing a rotational angiography acquisition. The rotational data was then reconstructed into a three-dimensional model and CT model on  an independent workstation. This 3-D model was utilized to localize and catheterize the left uterine artery. A 2.8 Fr coaxial microcatheter was then introduced through the diagnostic catheter and used to selectively catheterize the left uterine artery. Selective arteriography of the left uterine artery was performed through the microcatheter. Left uterine artery embolization was then performed with installation of microsphere particles. Follow-up arteriography was performed after embolization. The microcatheter was removed. The diagnostic catheter was then retracted and used to selectively catheterize the right internal iliac artery. Selective pelvic arteriography was performed of the right internal iliac artery utilizing a rotational angiography acquisition. The rotational data was then reconstructed into a three-dimensional model and CT model on an independent workstation. This 3-D model was utilized to localize and catheterize the right uterine artery. The microcatheter was then reintroduced and used to selectively catheterize the right uterine artery. Selective arteriography of the right uterine artery was then performed through the microcatheter. Right uterine artery embolization was then performed with installation of microsphere particles. Follow-up arteriography was performed after embolization. Right femoral arteriotomy hemostasis: Cordis ExoSeal device COMPLICATIONS: None. FINDINGS: Bilateral uterine arteriography shows multiple enlarged hypervascular trunks supplying uterine fibroids. Left uterine artery embolization was performed utilizing 1 and 3/4 vials of 500-700 micron sized Embosphere particles. Completion arteriography demonstrates adequate occlusion of branches supplying uterine fibroids. Right uterine artery embolization was performed utilizing 2 and 1/4 vials of 500-700 micron sized and 1 and 3/4 vials of 700-900 micron sized Embosphere particles. Completion arteriography demonstrates adequate  occlusion of branches supplying uterine fibroids. Adequate hemostasis was achieved at the femoral arteriotomy site. IMPRESSION: Successful bilateral uterine artery embolization to treat symptomatic uterine fibroid disease. Adequate occlusion of branch vessels supplying uterine fibroids was achieved with microsphere particle embolization. The patient was admitted for overnight observation for treatment of post embolization symptoms. Electronically Signed   By: Aletta Edouard M.D.   On: 03/07/2021 13:30   IR US Guide Vasc Access Right  Result Date: 03/07/2021 CLINICAL DATA:  Symptomatic uterine fibroids with menorrhagia and bulk symptoms. The patient presents for elective uterine fibroid embolization. EXAM: 1. ULTRASOUND GUIDANCE FOR VASCULAR ACCESS OF THE RIGHT COMMON FEMORAL ARTERY 2. SELECTIVE PELVIC ARTERIOGRAPHY OF BILATERAL INTERNAL ILIAC ARTERIES 3. THREE-DIMENSIONAL RECONSTRUCTION OF ROTATIONAL ARTERIOGRAPHY UTILIZING CONE BEAM CT AND INDEPENDENT WORKSTATION 4.  SELECTIVE ARTERIOGRAPHY OF BILATERAL UTERINE ARTERIES 5. TRANSCATHETER EMBOLIZATION BILATERAL UTERINE ARTERIES FOR EMBOLIZATION OF THE UTERUS ANESTHESIA/SEDATION: Moderate (conscious) sedation was employed during this procedure. A total of Versed 6.0 mg and Fentanyl 200 mcg was administered intravenously. Moderate Sedation Time: 105 minutes. The patient's level of consciousness and vital signs were monitored continuously by radiology nursing throughout the procedure under my direct supervision. MEDICATIONS: 2 g IV Ancef, 30 mg IV Toradol FLUOROSCOPY TIME:  28 minutes.  1411 mGy. CONTRAST:  175 mL Omnipaque 300 PROCEDURE: The procedure, risks, benefits, and alternatives were explained to the patient. Questions regarding the procedure were encouraged and answered. The patient understands and consents to the procedure. A time-out was performed prior to initiating the procedure. The right groin was prepped with chlorhexidine in a sterile fashion, and a  sterile drape was applied covering the operative field. A sterile gown and sterile gloves were used for the procedure. Local anesthesia was provided with 1% Lidocaine. After small skin incision, a 21 gauge needle was advanced into the right common femoral artery under direct ultrasound guidance. Ultrasound image documentation was performed. After establishing guide wire access, a 5-French sheath was placed. A 5 Fr diagnostic catheter was advanced over a guidewire into the distal abdominal aorta. The catheter was then used to selectively catheterize the left common iliac artery followed by the left internal iliac artery. Selective pelvic arteriography was performed of the left internal iliac artery utilizing a rotational angiography acquisition. The rotational data was then reconstructed into a three-dimensional model and CT model on an independent workstation. This 3-D model was utilized to localize and catheterize the left uterine artery. A 2.8 Fr coaxial microcatheter was then introduced through the diagnostic catheter and used to selectively catheterize the left uterine artery. Selective arteriography of the left uterine artery was performed through the microcatheter. Left uterine artery embolization was then performed with installation of microsphere particles. Follow-up arteriography was performed after embolization. The microcatheter was removed. The diagnostic catheter was then retracted and used to selectively catheterize the right internal iliac artery. Selective pelvic arteriography was performed of the right internal iliac artery utilizing a rotational angiography acquisition. The rotational data was then reconstructed into a three-dimensional model and CT model on an independent workstation. This 3-D model was utilized to localize and catheterize the right uterine artery. The microcatheter was then reintroduced and used to selectively catheterize the right uterine artery. Selective arteriography of the  right uterine artery was then performed through the microcatheter. Right uterine artery embolization was then performed with installation of microsphere particles. Follow-up arteriography was performed after embolization. Right femoral arteriotomy hemostasis: Cordis ExoSeal device COMPLICATIONS: None. FINDINGS: Bilateral uterine arteriography shows multiple enlarged hypervascular trunks supplying uterine fibroids. Left uterine artery embolization was performed utilizing 1 and 3/4 vials of 500-700 micron sized Embosphere particles. Completion arteriography demonstrates adequate occlusion of branches supplying uterine fibroids. Right uterine artery embolization was performed utilizing 2 and 1/4 vials of 500-700 micron sized and 1 and 3/4 vials of 700-900 micron sized Embosphere particles. Completion arteriography demonstrates adequate occlusion of branches supplying uterine fibroids. Adequate hemostasis was achieved at the femoral arteriotomy site. IMPRESSION: Successful bilateral uterine artery embolization to treat symptomatic uterine fibroid disease. Adequate occlusion of branch vessels supplying uterine fibroids was achieved with microsphere particle embolization. The patient was admitted for overnight observation for treatment of post embolization symptoms. Electronically Signed   By: Aletta Edouard M.D.   On: 03/07/2021 13:30   IR EMBO TUMOR ORGAN ISCHEMIA INFARCT INC GUIDE  ROADMAPPING  Result Date: 03/07/2021 CLINICAL DATA:  Symptomatic uterine fibroids with menorrhagia and bulk symptoms. The patient presents for elective uterine fibroid embolization. EXAM: 1. ULTRASOUND GUIDANCE FOR VASCULAR ACCESS OF THE RIGHT COMMON FEMORAL ARTERY 2. SELECTIVE PELVIC ARTERIOGRAPHY OF BILATERAL INTERNAL ILIAC ARTERIES 3. THREE-DIMENSIONAL RECONSTRUCTION OF ROTATIONAL ARTERIOGRAPHY UTILIZING CONE BEAM CT AND INDEPENDENT WORKSTATION 4. SELECTIVE ARTERIOGRAPHY OF BILATERAL UTERINE ARTERIES 5. TRANSCATHETER EMBOLIZATION  BILATERAL UTERINE ARTERIES FOR EMBOLIZATION OF THE UTERUS ANESTHESIA/SEDATION: Moderate (conscious) sedation was employed during this procedure. A total of Versed 6.0 mg and Fentanyl 200 mcg was administered intravenously. Moderate Sedation Time: 105 minutes. The patient's level of consciousness and vital signs were monitored continuously by radiology nursing throughout the procedure under my direct supervision. MEDICATIONS: 2 g IV Ancef, 30 mg IV Toradol FLUOROSCOPY TIME:  28 minutes.  1411 mGy. CONTRAST:  175 mL Omnipaque 300 PROCEDURE: The procedure, risks, benefits, and alternatives were explained to the patient. Questions regarding the procedure were encouraged and answered. The patient understands and consents to the procedure. A time-out was performed prior to initiating the procedure. The right groin was prepped with chlorhexidine in a sterile fashion, and a sterile drape was applied covering the operative field. A sterile gown and sterile gloves were used for the procedure. Local anesthesia was provided with 1% Lidocaine. After small skin incision, a 21 gauge needle was advanced into the right common femoral artery under direct ultrasound guidance. Ultrasound image documentation was performed. After establishing guide wire access, a 5-French sheath was placed. A 5 Fr diagnostic catheter was advanced over a guidewire into the distal abdominal aorta. The catheter was then used to selectively catheterize the left common iliac artery followed by the left internal iliac artery. Selective pelvic arteriography was performed of the left internal iliac artery utilizing a rotational angiography acquisition. The rotational data was then reconstructed into a three-dimensional model and CT model on an independent workstation. This 3-D model was utilized to localize and catheterize the left uterine artery. A 2.8 Fr coaxial microcatheter was then introduced through the diagnostic catheter and used to selectively  catheterize the left uterine artery. Selective arteriography of the left uterine artery was performed through the microcatheter. Left uterine artery embolization was then performed with installation of microsphere particles. Follow-up arteriography was performed after embolization. The microcatheter was removed. The diagnostic catheter was then retracted and used to selectively catheterize the right internal iliac artery. Selective pelvic arteriography was performed of the right internal iliac artery utilizing a rotational angiography acquisition. The rotational data was then reconstructed into a three-dimensional model and CT model on an independent workstation. This 3-D model was utilized to localize and catheterize the right uterine artery. The microcatheter was then reintroduced and used to selectively catheterize the right uterine artery. Selective arteriography of the right uterine artery was then performed through the microcatheter. Right uterine artery embolization was then performed with installation of microsphere particles. Follow-up arteriography was performed after embolization. Right femoral arteriotomy hemostasis: Cordis ExoSeal device COMPLICATIONS: None. FINDINGS: Bilateral uterine arteriography shows multiple enlarged hypervascular trunks supplying uterine fibroids. Left uterine artery embolization was performed utilizing 1 and 3/4 vials of 500-700 micron sized Embosphere particles. Completion arteriography demonstrates adequate occlusion of branches supplying uterine fibroids. Right uterine artery embolization was performed utilizing 2 and 1/4 vials of 500-700 micron sized and 1 and 3/4 vials of 700-900 micron sized Embosphere particles. Completion arteriography demonstrates adequate occlusion of branches supplying uterine fibroids. Adequate hemostasis was achieved at the femoral arteriotomy site. IMPRESSION: Successful bilateral  uterine artery embolization to treat symptomatic uterine fibroid  disease. Adequate occlusion of branch vessels supplying uterine fibroids was achieved with microsphere particle embolization. The patient was admitted for overnight observation for treatment of post embolization symptoms. Electronically Signed   By: Aletta Edouard M.D.   On: 03/07/2021 13:30    Labs:  CBC: Recent Labs    09/11/20 1124 03/06/21 1233  WBC  --  5.4  HGB  --  8.6*  HCT 33.9* 30.9*  PLT  --  328    COAGS: Recent Labs    03/06/21 1233  INR 0.9    BMP: Recent Labs    09/11/20 1124 03/06/21 1233  NA 140 138  K 4.6 4.0  CL 104 107  CO2 20 25  GLUCOSE 89 93  BUN 11 17  CALCIUM 8.5* 8.4*  CREATININE 0.59 0.59  GFRNONAA  --  >60    LIVER FUNCTION TESTS: Recent Labs    09/11/20 1124  BILITOT 0.4  AST 32  ALT 34*  ALKPHOS 60  PROT 6.6  ALBUMIN 3.7*      Assessment and Plan:  Ms. Shiroma is doing well following uterine fibroid embolization.  She has already noticed decrease in abdominal bloating and a decrease in urinary frequency after the procedure.  I recommended a follow-up MRI of the pelvis next year approximately 8 to 9 months following the procedure.   Electronically Signed: Azzie Roup 04/03/2021, 2:09 PM    I spent a total of 10 Minutes in remote  clinical consultation, greater than 50% of which was counseling/coordinating care post uterine fibroid embolization.    Visit type: Audio only (telephone). Audio (no video) only due to patient's lack of internet/smartphone capability. Alternative for in-person consultation at [facility], [address] [place], [place], [place]. This visit type was conducted due to national recommendations for restrictions regarding the COVID-19 Pandemic (e.g. social distancing).  This format is felt to be most appropriate for this patient at this time.  All issues noted in this document were discussed and addressed.

## 2021-04-04 ENCOUNTER — Encounter: Payer: Self-pay | Admitting: *Deleted

## 2021-06-20 ENCOUNTER — Other Ambulatory Visit: Payer: Self-pay | Admitting: Obstetrics & Gynecology

## 2021-06-20 DIAGNOSIS — N944 Primary dysmenorrhea: Secondary | ICD-10-CM

## 2021-08-11 ENCOUNTER — Other Ambulatory Visit: Payer: Self-pay | Admitting: Obstetrics

## 2021-08-11 DIAGNOSIS — Z1231 Encounter for screening mammogram for malignant neoplasm of breast: Secondary | ICD-10-CM

## 2021-08-20 ENCOUNTER — Ambulatory Visit: Payer: 59

## 2021-08-25 ENCOUNTER — Ambulatory Visit
Admission: RE | Admit: 2021-08-25 | Discharge: 2021-08-25 | Disposition: A | Discharge status: Home / Self Care | Payer: 59 | Source: Ambulatory Visit | Attending: Obstetrics | Admitting: Obstetrics

## 2021-08-25 ENCOUNTER — Other Ambulatory Visit: Payer: Self-pay

## 2021-08-25 DIAGNOSIS — Z1231 Encounter for screening mammogram for malignant neoplasm of breast: Secondary | ICD-10-CM

## 2021-08-25 IMAGING — MG MM DIGITAL SCREENING BILAT W/ TOMO AND CAD
8 series · 8 of 24 positions shown · non-contrast
Comparison: Previous exam(s).

CLINICAL DATA: Screening.

EXAM:
DIGITAL SCREENING BILATERAL MAMMOGRAM WITH TOMOSYNTHESIS AND CAD
TECHNIQUE: Bilateral screening digital craniocaudal and mediolateral oblique
mammograms were obtained. Bilateral screening digital breast
tomosynthesis was performed. The images were evaluated with
computer-aided detection.

[R CC synth-2D]
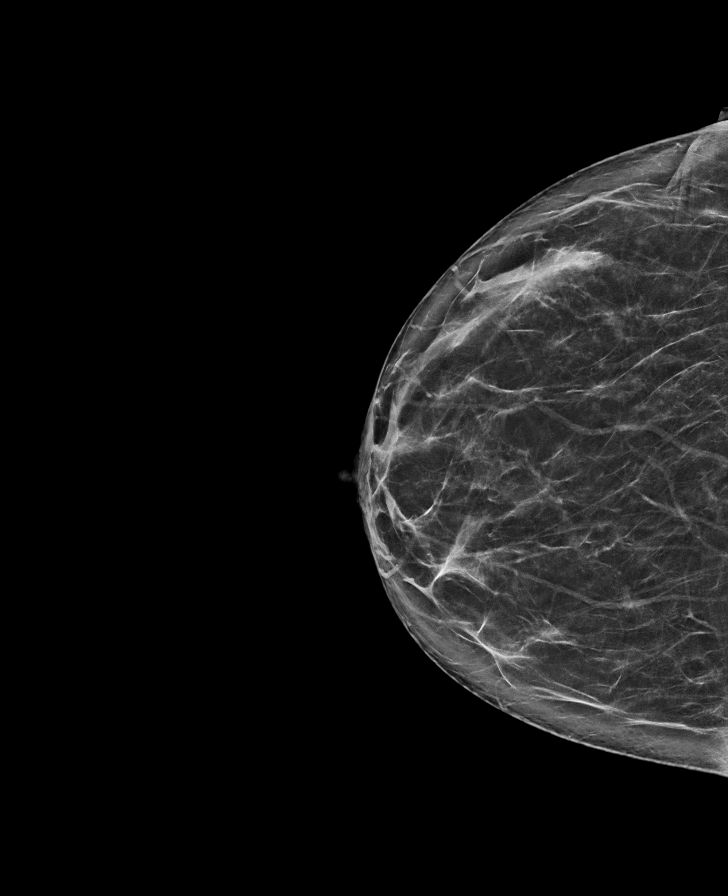

[R MLO synth-2D]
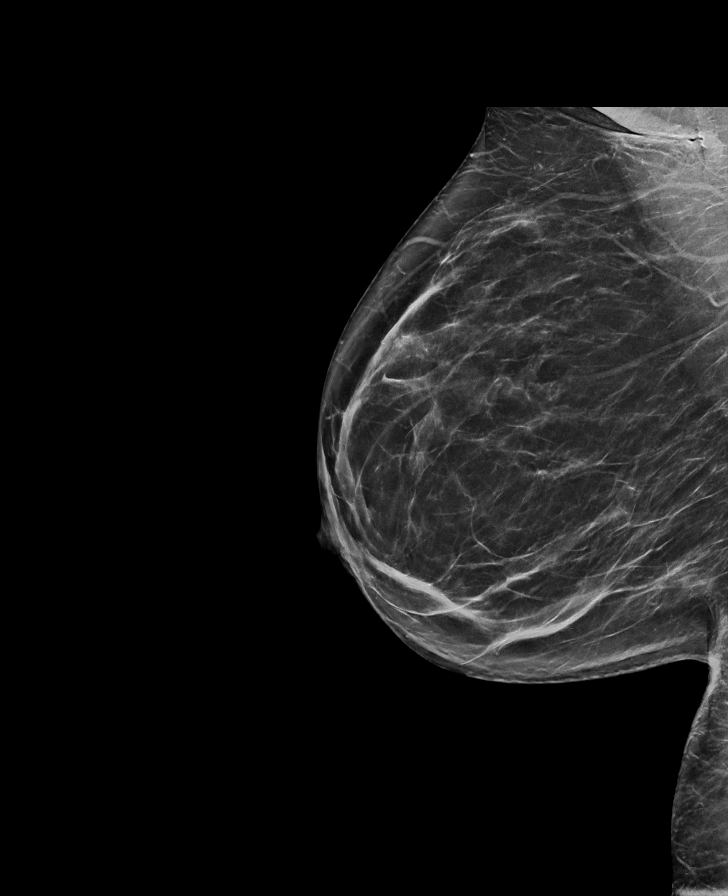

[L CC synth-2D]
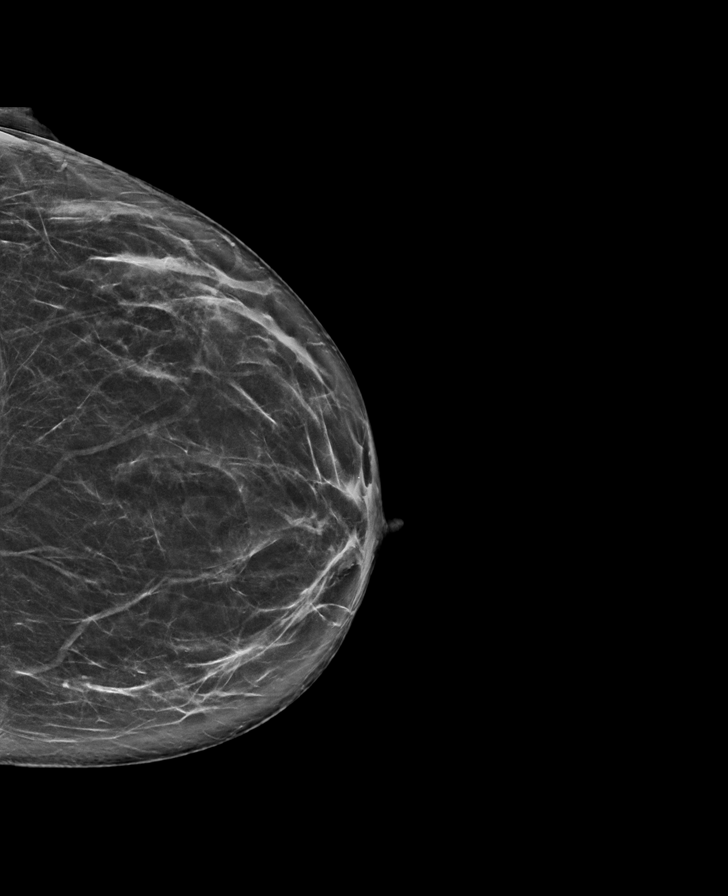

[L MLO synth-2D]
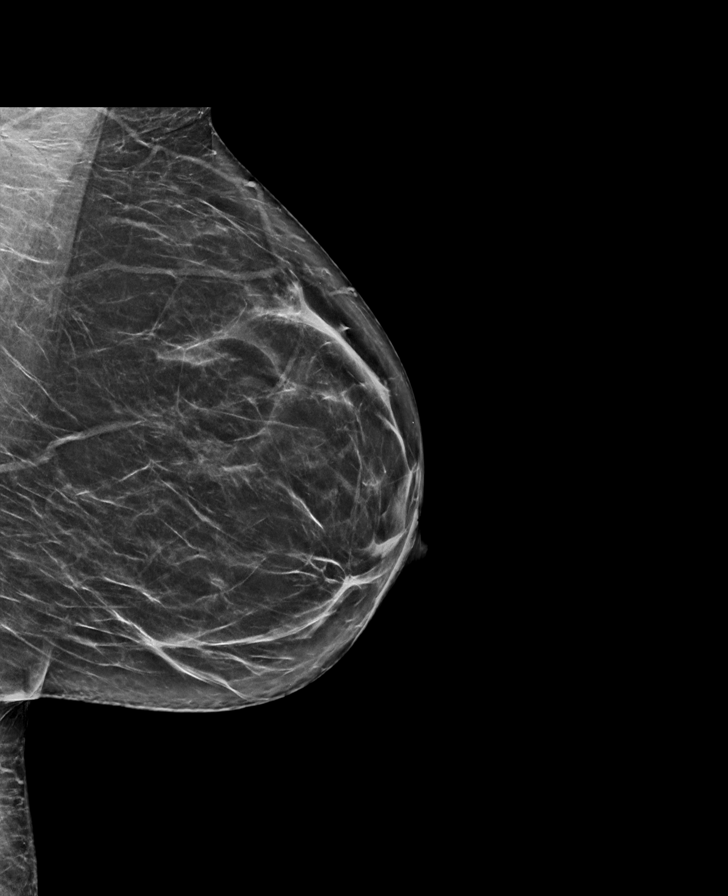

[L CC tomo · tomo slice 39/78.0]
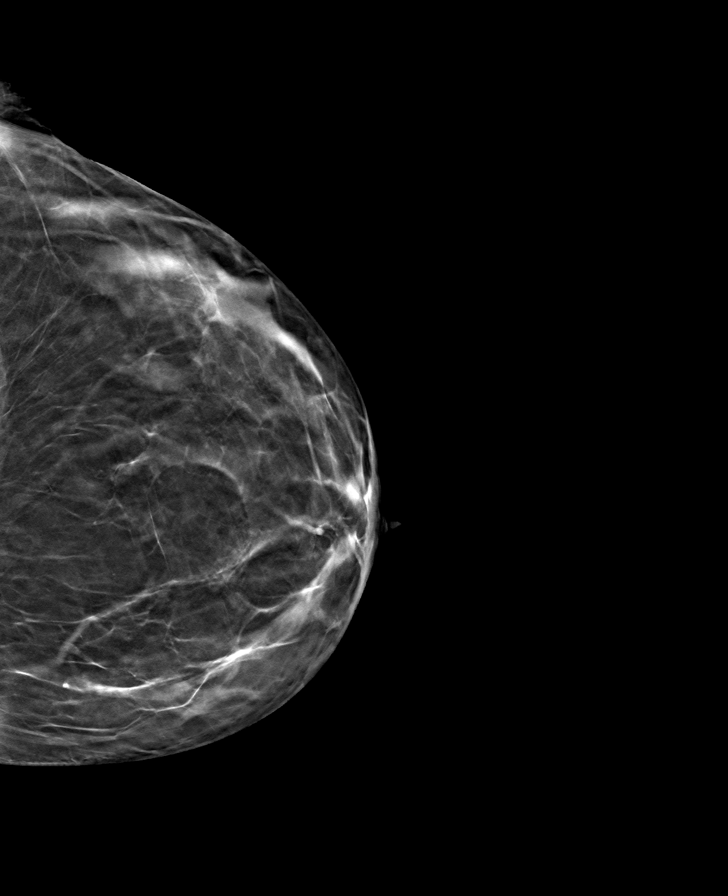

[R CC tomo · tomo slice 41/82.0]
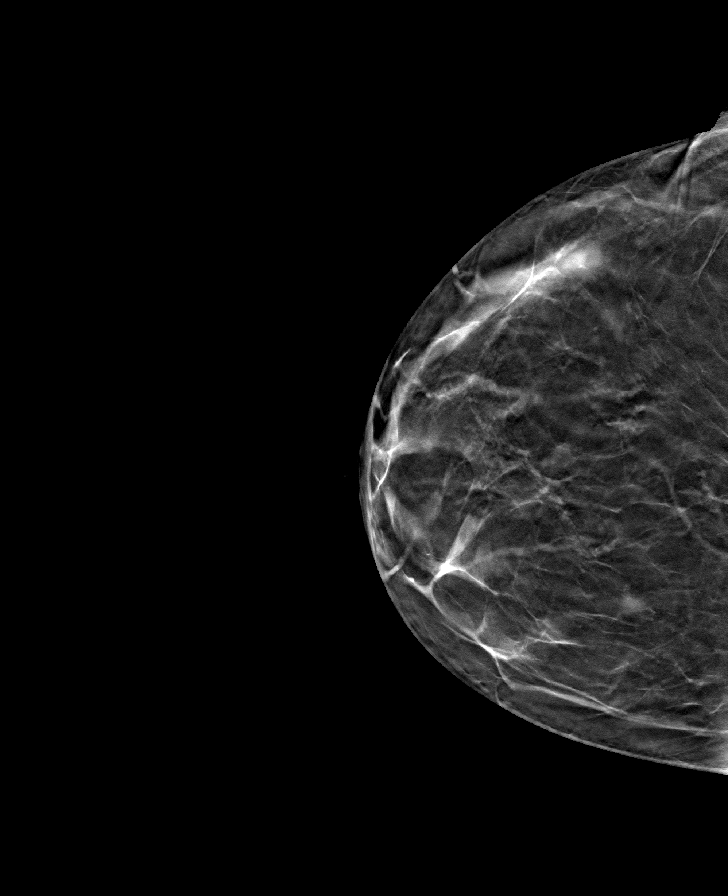

[L MLO tomo · tomo slice 36/71.0]
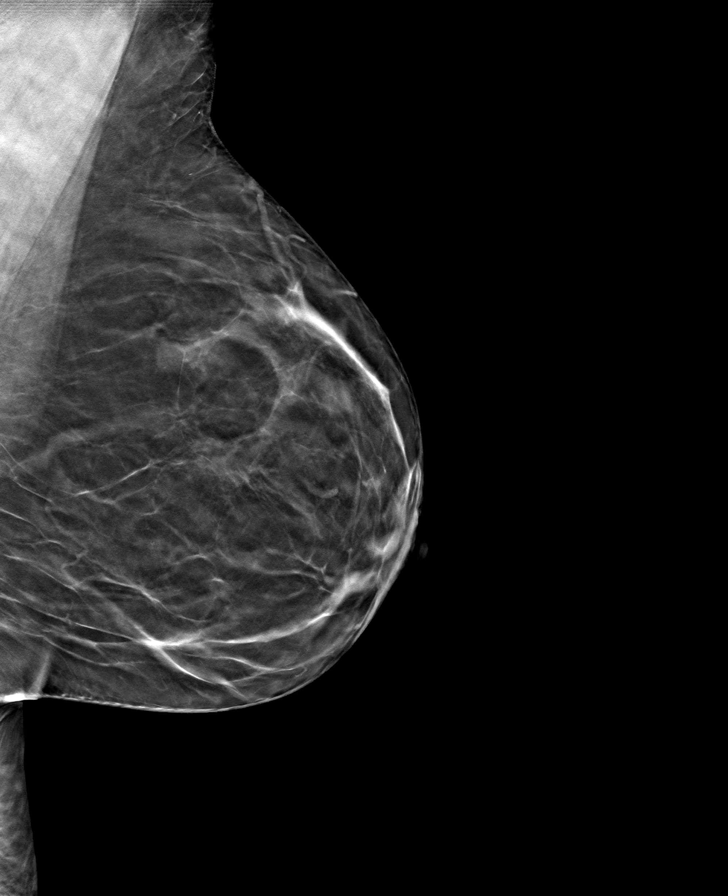

[R MLO tomo · tomo slice 39/76.0]
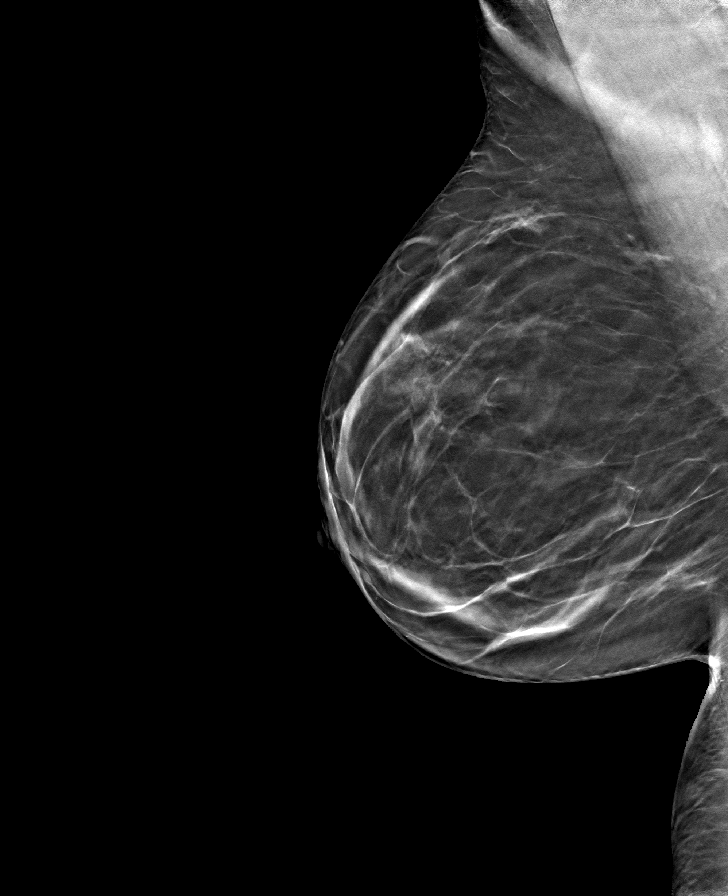

[8 of 24 positions shown; findings below may reference images not displayed]

ACR Breast Density Category b: There are scattered areas of
fibroglandular density.
FINDINGS: There are no findings suspicious for malignancy.
IMPRESSION: No mammographic evidence of malignancy. A result letter of this
screening mammogram will be mailed directly to the patient.

RECOMMENDATION:
Screening mammogram in one year. (Code:[BY])

BI-RADS CATEGORY  1: Negative.

## 2021-08-26 ENCOUNTER — Other Ambulatory Visit (HOSPITAL_COMMUNITY)
Admission: RE | Admit: 2021-08-26 | Discharge: 2021-08-26 | Disposition: A | Discharge status: Home / Self Care | Payer: 59 | Source: Ambulatory Visit | Attending: Obstetrics | Admitting: Obstetrics

## 2021-08-26 ENCOUNTER — Ambulatory Visit: Payer: 59 | Admitting: Obstetrics

## 2021-08-26 ENCOUNTER — Encounter: Payer: Self-pay | Admitting: Obstetrics

## 2021-08-26 VITALS — BP 112/70 | HR 66 | Wt 216.0 lb

## 2021-08-26 DIAGNOSIS — Z01419 Encounter for gynecological examination (general) (routine) without abnormal findings: Secondary | ICD-10-CM

## 2021-08-26 DIAGNOSIS — N898 Other specified noninflammatory disorders of vagina: Secondary | ICD-10-CM

## 2021-08-26 DIAGNOSIS — E669 Obesity, unspecified: Secondary | ICD-10-CM | POA: Diagnosis not present

## 2021-08-26 DIAGNOSIS — B3731 Acute candidiasis of vulva and vagina: Secondary | ICD-10-CM | POA: Diagnosis not present

## 2021-08-26 MED ORDER — CLOTRIMAZOLE 1 % EX CREA: 1.0000 "application " | TOPICAL_CREAM | Freq: Two times a day (BID) | CUTANEOUS | 0 refills | Status: DC

## 2021-08-26 MED ORDER — CLOTRIMAZOLE 1 % EX CREA: 1.0000 "application " | TOPICAL_CREAM | Freq: Two times a day (BID) | CUTANEOUS | 2 refills | Status: AC

## 2021-08-26 MED ORDER — FLUCONAZOLE 200 MG PO TABS
200.0000 mg | ORAL_TABLET | ORAL | 2 refills | Status: AC
Start: 2021-08-26 — End: ?

## 2021-08-26 NOTE — Progress Notes (Signed)
Pt states she is having some vaginal irritation,  denies any abn d/c, itching or odor x 1 month.  ?

## 2021-08-26 NOTE — Progress Notes (Signed)
? ?Subjective: ? ? ?  ?  ? Sherry Blair is a 51 y.o. female here for a routine exam.  Curren complaints:  Vaginal discharge, and itching on the outside only. ? ?Personal health questionnaire:  ?Is patient [name], have a family history of breast and/or ovarian cancer: no ?Is there a family history of uterine cancer diagnosed at age < 2, gastrointestinal cancer, urinary tract cancer, family member who is a Field seismologist syndrome-associated carrier: no ?Is the patient overweight and hypertensive, family history of diabetes, personal history of gestational diabetes, preeclampsia or PCOS: no ?Is patient over 78, have PCOS,  family history of premature CHD under age 30, diabetes, smoke, have hypertension or peripheral artery disease:  no ?At any time, has a partner hit, kicked or otherwise hurt or frightened you?: no ?Over the past 2 weeks, have you felt down, depressed or hopeless?: no ?Over the past 2 weeks, have you felt little interest or pleasure in doing things?:no ? ? ?Gynecologic History ?Patient's last menstrual period was 08/22/2021 (approximate). ?Contraception: none ?Last Pap: 2021. Results were: normal ?Last mammogram: 08-25-2021. Results were: normal ? ?Obstetric History ?OB History  ?Gravida Para Term Preterm AB Living  ?'4 2 2   2 2  '$ ?SAB IAB Ectopic Multiple Live Births  ?  '1 1   2  '$ ?  ?# Outcome Date GA Lbr Len/2nd Weight Sex Delivery Anes PTL Lv  ?4 Ectopic 2011 [redacted]w[redacted]d      DEC  ?3 Term 09/19/97 [id number] lb 8 oz (3.402 kg) M Vag-Spont EPI  LIV  ?2 Term 08/24/96 [id number] lb 6 oz (3.345 kg) F Vag-Spont EPI  LIV  ?1 IAB         DEC  ? ? ?Past Medical History:  ?Diagnosis Date  ? Anemia   ? low iron and fibroids  ? Chronic kidney disease 09/2018  ? Acute kidney failure  ? Fibroids   ? GERD (gastroesophageal reflux disease)   ? History of ectopic pregnancy 07/20/2009  ? Infertility, female   ? Leiomyoma of uterus   ? Vitamin D deficiency   ?  ?Past Surgical History:  ?Procedure Laterality Date  ? DX  LAPAROSCOPY/  EXPLORATORY LAPARTOMY/ RIGHT SALPINGECTOMY/ WEDGE RESECTION FO THE UTERINE CORNU ON THE RIGHT , ECTOPIC PREGNANCY  07-20-2009    dr jacDelsa SaleH [facility] GASTRIC BYPASS    ? IR 3D INDEPENDENT WKST  03/06/2021  ? IR ANGIOGRAM PELVIS SELECTIVE OR SUPRASELECTIVE  03/06/2021  ? IR ANGIOGRAM SELECTIVE EACH ADDITIONAL VESSEL  03/06/2021  ? IR ANGIOGRAM SELECTIVE EACH ADDITIONAL VESSEL  03/06/2021  ? IR EMBO TUMOR ORGAN ISCHEMIA INFARCT INC GUIDE ROADMAPPING  03/06/2021  ? IR RADIOLOGIST EVAL & MGMT  12/10/2020  ? IR RADIOLOGIST EVAL & MGMT  04/03/2021  ? IR US [place] VASC ACCESS RIGHT  03/06/2021  ?  ? ?Current Outpatient Medications:  ?  Ascorbic Acid (VITAMIN C) 100 MG tablet, Take 100 mg by mouth daily., Disp: , Rfl:  ?  Biotin 1 MG CAPS, Take by mouth., Disp: , Rfl:  ?  Cholecalciferol (VITAMIN D) 50 MCG (2000 UT) CAPS, Take by mouth., Disp: , Rfl:  ?  ferrous sulfate 325 (65 FE) MG tablet, Take 1 tablet (325 mg total) by mouth every other day., Disp: 30 tablet, Rfl: 5 ?  fluconazole (DIFLUCAN) 200 MG tablet, Take 1 tablet (200 mg total) by mouth every 3 (three) days., Disp: 3 tablet, Rfl: 2 ?  Vitamin  D, Ergocalciferol, (DRISDOL) 1.25 MG (50000 UNIT) CAPS capsule, Take 1 capsule (50,000 Units total) by mouth every 7 (seven) days., Disp: 4 capsule, Rfl: 0 ?  ampicillin (PRINCIPEN) 500 MG capsule, Take 1 capsule (500 mg total) by mouth 3 (three) times daily. (Patient not taking: Reported on 03/06/2021), Disp: 21 capsule, Rfl: 0 ?  clotrimazole (LOTRIMIN) 1 % cream, Apply 1 application. topically 2 (two) times daily. Apply externally only., Disp: 85 g, Rfl: 2 ?  HYDROcodone-acetaminophen (NORCO/VICODIN) 5-325 MG tablet, Take 1-2 tablets by mouth every 4 (four) hours as needed for moderate pain., Disp: 20 tablet, Rfl: 0 ?  ibuprofen (ADVIL) 800 MG tablet, TAKE 1 TABLET BY MOUTH EVERY 8 HOURS START BEFORE PERIOD UNTIL PERIOD ENDS, MONTHLY, Disp: 30 tablet, Rfl: 2 ?  metroNIDAZOLE (FLAGYL) 500 MG tablet, Take 1 tablet  (500 mg total) by mouth 2 (two) times daily. (Patient not taking: Reported on 03/06/2021), Disp: 14 tablet, Rfl: 0 ?  promethazine (PHENERGAN) 25 MG tablet, Take 1 tablet (25 mg total) by mouth every 8 (eight) hours as needed for nausea., Disp: 20 tablet, Rfl: 0 ?No Known Allergies  ?Social History  ? ?Tobacco Use  ? Smoking status: Never  ? Smokeless tobacco: Never  ?Substance Use Topics  ? Alcohol use: Yes  ?  Alcohol/week: 0.0 standard drinks  ?  Comment: socially  ?  ?Family History  ?Problem Relation Age of Onset  ? Hyperlipidemia Mother   ? Lupus Mother   ? Hypertension Father   ? Diabetes Father   ? Obesity Father   ? Hypertension Brother   ?  ? ? ?Review of Systems ? ?Constitutional: negative for fatigue and weight loss ?Respiratory: negative for cough and wheezing ?Cardiovascular: negative for chest pain, fatigue and palpitations ?Gastrointestinal: negative for abdominal pain and change in bowel habits ?Musculoskeletal:negative for myalgias ?Neurological: negative for gait problems and tremors ?Behavioral/Psych: negative for abusive relationship, depression ?Endocrine: negative for temperature intolerance    ?Genitourinary:negative for abnormal menstrual periods, genital lesions, hot flashes, sexual problems and vaginal discharge ?Integument/breast: negative for breast lump, breast tenderness, nipple discharge and skin lesion(s) ? ?  ?Objective:  ? ?    ?BP 112/70   Pulse 66   Wt 216 lb (98 kg)   LMP 08/22/2021 (Approximate)   BMI 38.26 kg/m?  ?General:   alert  ?Skin:   no rash or abnormalities  ?Lungs:   clear to auscultation bilaterally  ?Heart:   regular rate and rhythm, S1, S2 normal, no murmur, click, rub or gallop  ?Breasts:   normal without suspicious masses, skin or nipple changes or axillary nodes  ?Abdomen:  normal findings: no organomegaly, soft, non-tender and no hernia  ?Pelvis:  External genitalia: normal general appearance ?Urinary system: urethral meatus normal and bladder without  fullness, nontender ?Vaginal: normal without tenderness, induration or masses ?Cervix: normal appearance ?Adnexa: normal bimanual exam ?Uterus: anteverted and non-tender, normal size  ? ?Lab Review ?Urine pregnancy test ?Labs reviewed yes ?Radiologic studies reviewed yes ? ?I have spent a total of 20 minutes of face-to-face time, excluding clinical staff time, reviewing notes and preparing to see patient, ordering tests and/or medications, and counseling the patient.  ? ?Assessment:  ? ? 1. Encounter for routine gynecological examination with Papanicolaou smear of cervix ?Rx: ?- Cytology - PAP( [facility]) ? ?2. Vaginal discharge ?Rx: ?- Cervicovaginal ancillary only( [place]) ?- fluconazole (DIFLUCAN) 200 MG tablet; Take 1 tablet (200 mg total) by mouth every 3 (three) days.  Dispense:  3 tablet; Refill: 2 ? ?3. Candidal vulvitis ?Rx: ?- clotrimazole (LOTRIMIN) 1 % cream; Apply 1 application. topically 2 (two) times daily. Apply externally only.  Dispense: 85 g; Refill: 2 ? ?4. Obesity (BMI 35.0-39.9 without comorbidity) ?- weight reduction with the aid of dietary changes, exercise and behavioral modification recommended ?  ?  ?Plan:  ? ? Education reviewed: calcium supplements, depression evaluation, low fat, low cholesterol diet, safe sex/STD prevention, self breast exams, and weight bearing exercise. ?Contraception: none. ?Follow up in: 1 month.  ? ?Meds ordered this encounter  ?Medications  ? DISCONTD: clotrimazole (LOTRIMIN) 1 % cream  ?  Sig: Apply 1 application. topically 2 (two) times daily.  ?  Dispense:  30 g  ?  Refill:  0  ? fluconazole (DIFLUCAN) 200 MG tablet  ?  Sig: Take 1 tablet (200 mg total) by mouth every 3 (three) days.  ?  Dispense:  3 tablet  ?  Refill:  2  ? clotrimazole (LOTRIMIN) 1 % cream  ?  Sig: Apply 1 application. topically 2 (two) times daily. Apply externally only.  ?  Dispense:  85 g  ?  Refill:  2  ? ? ? [name] Bombard, MD ?08/26/2021 5:06 PM  ?

## 2021-08-28 LAB — CERVICOVAGINAL ANCILLARY ONLY
Bacterial Vaginitis (gardnerella): NEGATIVE
Candida Glabrata: NEGATIVE
Candida Vaginitis: POSITIVE — AB
Chlamydia: NEGATIVE
Comment: NEGATIVE
Comment: NEGATIVE
Comment: NEGATIVE
Comment: NEGATIVE
Comment: NEGATIVE
Comment: NORMAL
Neisseria Gonorrhea: NEGATIVE
Trichomonas: NEGATIVE

## 2021-08-28 LAB — CYTOLOGY - PAP
Comment: NEGATIVE
Diagnosis: NEGATIVE
High risk HPV: NEGATIVE

## 2021-09-02 ENCOUNTER — Other Ambulatory Visit: Payer: Self-pay | Admitting: Obstetrics

## 2021-09-10 ENCOUNTER — Telehealth: Payer: Self-pay

## 2021-09-10 NOTE — Telephone Encounter (Signed)
Pt called and stated that she is still having vaginal irritation despite taking diflucan and using external cream. Routed to provider for review. ?

## 2021-09-13 ENCOUNTER — Other Ambulatory Visit: Payer: Self-pay | Admitting: Obstetrics

## 2021-09-13 DIAGNOSIS — B3731 Acute candidiasis of vulva and vagina: Secondary | ICD-10-CM

## 2021-09-13 MED ORDER — TERCONAZOLE 0.8 % VA CREA: 1.0000 | TOPICAL_CREAM | Freq: Every day | VAGINAL | 0 refills | Status: AC

## 2021-10-13 ENCOUNTER — Other Ambulatory Visit: Payer: Self-pay | Admitting: Obstetrics and Gynecology

## 2021-10-13 ENCOUNTER — Other Ambulatory Visit: Payer: Self-pay | Admitting: Interventional Radiology

## 2021-10-13 DIAGNOSIS — D250 Submucous leiomyoma of uterus: Secondary | ICD-10-CM

## 2021-11-05 ENCOUNTER — Other Ambulatory Visit (HOSPITAL_COMMUNITY): Payer: Self-pay | Admitting: Interventional Radiology

## 2021-11-05 ENCOUNTER — Other Ambulatory Visit: Payer: Self-pay | Admitting: Interventional Radiology

## 2021-11-05 DIAGNOSIS — D259 Leiomyoma of uterus, unspecified: Secondary | ICD-10-CM

## 2022-01-28 ENCOUNTER — Encounter (INDEPENDENT_AMBULATORY_CARE_PROVIDER_SITE_OTHER): Payer: Self-pay

## 2022-02-05 ENCOUNTER — Ambulatory Visit (HOSPITAL_COMMUNITY): Payer: 59

## 2022-02-09 ENCOUNTER — Other Ambulatory Visit: Payer: Self-pay | Admitting: Obstetrics

## 2022-02-09 DIAGNOSIS — D500 Iron deficiency anemia secondary to blood loss (chronic): Secondary | ICD-10-CM

## 2022-02-18 ENCOUNTER — Telehealth: Payer: 59

## 2022-04-08 ENCOUNTER — Other Ambulatory Visit (HOSPITAL_COMMUNITY)
Admission: RE | Admit: 2022-04-08 | Discharge: 2022-04-08 | Disposition: A | Discharge status: Home / Self Care | Payer: 59 | Source: Ambulatory Visit | Attending: Obstetrics | Admitting: Obstetrics

## 2022-04-08 ENCOUNTER — Encounter: Payer: Self-pay | Admitting: Obstetrics

## 2022-04-08 ENCOUNTER — Ambulatory Visit: Payer: 59 | Admitting: Obstetrics

## 2022-04-08 VITALS — BP 118/70 | HR 64 | Ht 63.0 in | Wt 185.0 lb

## 2022-04-08 DIAGNOSIS — N898 Other specified noninflammatory disorders of vagina: Secondary | ICD-10-CM | POA: Diagnosis present

## 2022-04-08 DIAGNOSIS — E669 Obesity, unspecified: Secondary | ICD-10-CM | POA: Diagnosis not present

## 2022-04-08 DIAGNOSIS — Z113 Encounter for screening for infections with a predominantly sexual mode of transmission: Secondary | ICD-10-CM | POA: Diagnosis not present

## 2022-04-08 DIAGNOSIS — E66811 Obesity, class 1: Secondary | ICD-10-CM

## 2022-04-08 MED ORDER — FLUCONAZOLE 150 MG PO TABS
150.0000 mg | ORAL_TABLET | Freq: Once | ORAL | 0 refills | Status: AC
Start: 2022-04-08 — End: 2022-04-08

## 2022-04-08 MED ORDER — METRONIDAZOLE 500 MG PO TABS: 500.0000 mg | ORAL_TABLET | Freq: Two times a day (BID) | ORAL | 2 refills | Status: AC

## 2022-04-08 NOTE — Progress Notes (Signed)
Patient ID: Sherry Blair, female   DOB: 04-12-1971, 51 y.o.   MRN: [id number]  Chief Complaint  Patient presents with   Vaginal Discharge    HPI Sherry Blair is a 51 y.o. female.  Complains of vaginal discharge and irritation. HPI  Past Medical History:  Diagnosis Date   Anemia    low iron and fibroids   Chronic kidney disease 09/2018   Acute kidney failure   Fibroids    GERD (gastroesophageal reflux disease)    History of ectopic pregnancy 07/20/2009   Infertility, female    Leiomyoma of uterus    Vitamin D deficiency     Past Surgical History:  Procedure Laterality Date   DX LAPAROSCOPY/  EXPLORATORY LAPARTOMY/ RIGHT SALPINGECTOMY/ WEDGE RESECTION FO THE UTERINE CORNU ON THE RIGHT , ECTOPIC PREGNANCY  07-20-2009    dr Delsa Sale  [facility]   GASTRIC BYPASS     IR 3D INDEPENDENT WKST  03/06/2021   IR ANGIOGRAM PELVIS SELECTIVE OR SUPRASELECTIVE  03/06/2021   IR ANGIOGRAM SELECTIVE EACH ADDITIONAL VESSEL  03/06/2021   IR ANGIOGRAM SELECTIVE EACH ADDITIONAL VESSEL  03/06/2021   IR EMBO TUMOR ORGAN ISCHEMIA INFARCT INC GUIDE ROADMAPPING  03/06/2021   IR RADIOLOGIST EVAL & MGMT  12/10/2020   IR RADIOLOGIST EVAL & MGMT  04/03/2021   IR US GUIDE VASC ACCESS RIGHT  03/06/2021    Family History  Problem Relation Age of Onset   Hyperlipidemia Mother    Lupus Mother    Hypertension Father    Diabetes Father    Obesity Father    Hypertension Brother     Social History Social History   Tobacco Use   Smoking status: Never   Smokeless tobacco: Never  Vaping Use   Vaping Use: Never used  Substance Use Topics   Alcohol use: Yes    Alcohol/week: 0.0 standard drinks of alcohol    Comment: socially   Drug use: No    No Known Allergies  Current Outpatient Medications  Medication Sig Dispense Refill   Ascorbic Acid (VITAMIN C) 100 MG tablet Take 100 mg by mouth daily.     Biotin 1 MG CAPS Take by mouth.     Cholecalciferol (VITAMIN D) 50 MCG (2000 UT) CAPS Take by  mouth.     clotrimazole (LOTRIMIN) 1 % cream Apply 1 application. topically 2 (two) times daily. Apply externally only. 85 g 2   ferrous sulfate 325 (65 FE) MG tablet TAKE 1 TABLET BY MOUTH EVERY OTHER DAY 30 tablet 5   ibuprofen (ADVIL) 800 MG tablet TAKE 1 TABLET BY MOUTH EVERY 8 HOURS START BEFORE PERIOD UNTIL PERIOD ENDS, MONTHLY 30 tablet 2   WEGOVY 2.4 MG/0.75ML SOAJ Inject into the skin.     ampicillin (PRINCIPEN) 500 MG capsule Take 1 capsule (500 mg total) by mouth 3 (three) times daily. (Patient not taking: Reported on 03/06/2021) 21 capsule 0   fluconazole (DIFLUCAN) 200 MG tablet Take 1 tablet (200 mg total) by mouth every 3 (three) days. (Patient not taking: Reported on 04/08/2022) 3 tablet 2   HYDROcodone-acetaminophen (NORCO/VICODIN) 5-325 MG tablet Take 1-2 tablets by mouth every 4 (four) hours as needed for moderate pain. (Patient not taking: Reported on 04/08/2022) 20 tablet 0   metroNIDAZOLE (FLAGYL) 500 MG tablet Take 1 tablet (500 mg total) by mouth 2 (two) times daily. (Patient not taking: Reported on 03/06/2021) 14 tablet 0   promethazine (PHENERGAN) 25 MG tablet Take 1 tablet (25 mg total) by mouth  every 8 (eight) hours as needed for nausea. (Patient not taking: Reported on 04/08/2022) 20 tablet 0   terconazole (TERAZOL 3) 0.8 % vaginal cream Place 1 applicator vaginally at bedtime. (Patient not taking: Reported on 04/08/2022) 20 g 0   Vitamin D, Ergocalciferol, (DRISDOL) 1.25 MG (50000 UNIT) CAPS capsule Take 1 capsule (50,000 Units total) by mouth every 7 (seven) days. 4 capsule 0   No current facility-administered medications for this visit.    Review of Systems Review of Systems Constitutional: negative for fatigue and weight loss Respiratory: negative for cough and wheezing Cardiovascular: negative for chest pain, fatigue and palpitations Gastrointestinal: negative for abdominal pain and change in bowel habits Genitourinary:positive for vaginal discharge and  irritation Integument/breast: negative for nipple discharge Musculoskeletal:negative for myalgias Neurological: negative for gait problems and tremors Behavioral/Psych: negative for abusive relationship, depression Endocrine: negative for temperature intolerance      Blood pressure 118/70, pulse 64, height '5\' 3"'$  (1.6 m), weight 185 lb (83.9 kg), last menstrual period 03/26/2022.  Physical Exam Physical Exam General:   Alert and no distress  Skin:   no rash or abnormalities  Lungs:   clear to auscultation bilaterally  Heart:   regular rate and rhythm, S1, S2 normal, no murmur, click, rub or gallop  Breasts:   normal without suspicious masses, skin or nipple changes or axillary nodes  Abdomen:  normal findings: no organomegaly, soft, non-tender and no hernia  Pelvis:  External genitalia: normal general appearance Urinary system: urethral meatus normal and bladder without fullness, nontender Vaginal: normal without tenderness, induration or masses Cervix: normal appearance Adnexa: normal bimanual exam Uterus: anteverted and non-tender, normal size    I have spent a total of 20 minutes of face-to-face and non-face-to-face time, excluding clinical staff time, reviewing notes and preparing to see patient, ordering tests and/or medications, and counseling the patient.   Data Reviewed Labs  Assessment     1. Vaginal discharge Rx: - Cervicovaginal ancillary only( [place]) - metroNIDAZOLE (FLAGYL) 500 MG tablet; Take 1 tablet (500 mg total) by mouth 2 (two) times daily.  Dispense: 14 tablet; Refill: 2 - fluconazole (DIFLUCAN) 150 MG tablet; Take 1 tablet (150 mg total) by mouth once for 1 dose.  Dispense: 1 tablet; Refill: 0  2. Screen for STD (sexually transmitted disease) Rx: - Hepatitis C Antibody - HIV antibody (with reflex) - RPR - Hepatitis B Surface AntiGEN  3. Obesity (BMI 30.0-34.9) - weight reduction options discussed     Plan   Follow up prn  Orders Placed This  Encounter  Procedures   Hepatitis C Antibody   HIV antibody (with reflex)   RPR   Hepatitis B Surface AntiGEN      [name] Bombard, MD 04/08/2022 4:46 PM

## 2022-04-08 NOTE — Progress Notes (Signed)
51 y.o GYN presents for vaginal discharge, irritation x 1+ week..   Last PAP 08/26/2021 NILM Last Mammogram  08/25/2021 Negative

## 2022-04-09 ENCOUNTER — Ambulatory Visit: Payer: 59 | Admitting: Obstetrics

## 2022-04-09 LAB — HEPATITIS B SURFACE ANTIGEN: Hepatitis B Surface Ag: NEGATIVE

## 2022-04-09 LAB — HIV ANTIBODY (ROUTINE TESTING W REFLEX): HIV Screen 4th Generation wRfx: NONREACTIVE

## 2022-04-09 LAB — RPR: RPR Ser Ql: NONREACTIVE

## 2022-04-09 LAB — HEPATITIS C ANTIBODY: Hep C Virus Ab: NONREACTIVE

## 2022-04-10 LAB — CERVICOVAGINAL ANCILLARY ONLY
Bacterial Vaginitis (gardnerella): POSITIVE — AB
Candida Glabrata: NEGATIVE
Candida Vaginitis: NEGATIVE
Chlamydia: NEGATIVE
Comment: NEGATIVE
Comment: NEGATIVE
Comment: NEGATIVE
Comment: NEGATIVE
Comment: NEGATIVE
Comment: NORMAL
Neisseria Gonorrhea: NEGATIVE
Trichomonas: NEGATIVE

## 2022-04-13 ENCOUNTER — Encounter: Payer: Self-pay | Admitting: *Deleted

## 2022-04-15 ENCOUNTER — Other Ambulatory Visit: Payer: Self-pay | Admitting: Obstetrics

## 2022-04-16 NOTE — Progress Notes (Signed)
TC. No answer. Left HIPPA compliant VM. MyChart message including education sent.

## 2022-04-17 ENCOUNTER — Telehealth: Payer: Self-pay

## 2022-04-17 NOTE — Telephone Encounter (Signed)
Returned call, no answer, left vm 

## 2022-04-29 ENCOUNTER — Telehealth: Payer: Self-pay | Admitting: Emergency Medicine

## 2022-04-29 NOTE — Telephone Encounter (Signed)
RC to patient to discuss symptoms. Pt informed of Rx refills, instructed to call back when abx course completed, if symptoms persist.

## 2023-01-20 ENCOUNTER — Ambulatory Visit: Payer: 59 | Admitting: Obstetrics and Gynecology

## 2023-02-23 ENCOUNTER — Ambulatory Visit (INDEPENDENT_AMBULATORY_CARE_PROVIDER_SITE_OTHER): Payer: 59 | Admitting: Emergency Medicine

## 2023-02-23 ENCOUNTER — Other Ambulatory Visit (HOSPITAL_COMMUNITY)
Admission: RE | Admit: 2023-02-23 | Discharge: 2023-02-23 | Disposition: A | Discharge status: Home / Self Care | Payer: 59 | Source: Ambulatory Visit | Attending: Obstetrics and Gynecology | Admitting: Obstetrics and Gynecology

## 2023-02-23 VITALS — BP 113/81 | HR 66 | Ht 63.0 in | Wt 170.0 lb

## 2023-02-23 DIAGNOSIS — N898 Other specified noninflammatory disorders of vagina: Secondary | ICD-10-CM

## 2023-02-23 DIAGNOSIS — Z113 Encounter for screening for infections with a predominantly sexual mode of transmission: Secondary | ICD-10-CM

## 2023-02-23 NOTE — Progress Notes (Signed)
SUBJECTIVE:  52 y.o. female complains of creamy and thick vaginal discharge for 1 week. Denies abnormal vaginal bleeding or significant pelvic pain or fever. No UTI symptoms. Denies history of known exposure to STD. Desires STD routine screen with blood work.   No LMP recorded.  OBJECTIVE:  She appears well, afebrile. Urine dipstick: not done.  ASSESSMENT:  Vaginal Discharge  Vaginal Odor   PLAN:  GC, chlamydia, trichomonas, BVAG, CVAG probe sent to lab. Blood work collected. Treatment: To be determined once lab results are received ROV prn if symptoms persist or worsen.

## 2023-02-24 LAB — HIV ANTIBODY (ROUTINE TESTING W REFLEX): HIV Screen 4th Generation wRfx: NONREACTIVE

## 2023-02-24 LAB — HEPATITIS B SURFACE ANTIGEN: Hepatitis B Surface Ag: NEGATIVE

## 2023-02-24 LAB — HEPATITIS C ANTIBODY: Hep C Virus Ab: NONREACTIVE

## 2023-02-24 LAB — RPR: RPR Ser Ql: NONREACTIVE

## 2023-02-25 LAB — CERVICOVAGINAL ANCILLARY ONLY
Bacterial Vaginitis (gardnerella): NEGATIVE
Candida Glabrata: NEGATIVE
Candida Vaginitis: NEGATIVE
Chlamydia: NEGATIVE
Comment: NEGATIVE
Comment: NEGATIVE
Comment: NEGATIVE
Comment: NEGATIVE
Comment: NEGATIVE
Comment: NORMAL
Neisseria Gonorrhea: NEGATIVE
Trichomonas: NEGATIVE

## 2023-02-28 ENCOUNTER — Other Ambulatory Visit: Payer: Self-pay | Admitting: Obstetrics

## 2023-02-28 DIAGNOSIS — D500 Iron deficiency anemia secondary to blood loss (chronic): Secondary | ICD-10-CM

## 2023-04-21 ENCOUNTER — Other Ambulatory Visit (HOSPITAL_COMMUNITY)
Admission: RE | Admit: 2023-04-21 | Discharge: 2023-04-21 | Disposition: A | Discharge status: Home / Self Care | Payer: 59 | Source: Ambulatory Visit | Attending: Obstetrics and Gynecology | Admitting: Obstetrics and Gynecology

## 2023-04-21 ENCOUNTER — Ambulatory Visit: Payer: 59 | Admitting: Emergency Medicine

## 2023-04-21 VITALS — BP 108/76 | HR 72 | Wt 171.2 lb

## 2023-04-21 DIAGNOSIS — N898 Other specified noninflammatory disorders of vagina: Secondary | ICD-10-CM | POA: Diagnosis not present

## 2023-04-21 DIAGNOSIS — N390 Urinary tract infection, site not specified: Secondary | ICD-10-CM

## 2023-04-21 NOTE — Progress Notes (Signed)
SUBJECTIVE:  52 y.o. female complains of clear vaginal discharge for 2 week(s). Denies abnormal vaginal bleeding or significant pelvic pain or fever. No UTI symptoms. Denies history of known exposure to STD.   No LMP recorded.  OBJECTIVE:  She appears well, afebrile. Urine dipstick: not done.  ASSESSMENT:  Vaginal Discharge  Vaginal Odor   PLAN:  GC, chlamydia, trichomonas, BVAG, CVAG probe sent to lab. TOC for recent UTI.  Treatment: To be determined once lab results are received ROV prn if symptoms persist or worsen.

## 2023-04-23 LAB — CERVICOVAGINAL ANCILLARY ONLY
Bacterial Vaginitis (gardnerella): POSITIVE — AB
Candida Glabrata: NEGATIVE
Candida Vaginitis: NEGATIVE
Chlamydia: NEGATIVE
Comment: NEGATIVE
Comment: NEGATIVE
Comment: NEGATIVE
Comment: NEGATIVE
Comment: NEGATIVE
Comment: NORMAL
Neisseria Gonorrhea: NEGATIVE
Trichomonas: NEGATIVE

## 2023-04-25 ENCOUNTER — Other Ambulatory Visit: Payer: Self-pay | Admitting: Obstetrics and Gynecology

## 2023-04-25 DIAGNOSIS — N3000 Acute cystitis without hematuria: Secondary | ICD-10-CM

## 2023-04-25 LAB — URINE CULTURE

## 2023-04-25 MED ORDER — CEPHALEXIN 500 MG PO CAPS
500.0000 mg | ORAL_CAPSULE | Freq: Three times a day (TID) | ORAL | 0 refills | Status: DC
Start: 2023-04-25 — End: 2024-01-31

## 2023-04-26 ENCOUNTER — Other Ambulatory Visit: Payer: Self-pay | Admitting: *Deleted

## 2023-04-26 DIAGNOSIS — B9689 Other specified bacterial agents as the cause of diseases classified elsewhere: Secondary | ICD-10-CM

## 2023-04-26 MED ORDER — METRONIDAZOLE 500 MG PO TABS
500.0000 mg | ORAL_TABLET | Freq: Two times a day (BID) | ORAL | 0 refills | Status: AC
Start: 2023-04-26 — End: ?

## 2023-04-26 NOTE — Progress Notes (Signed)
TC. No answer. Left HIPAA compliant VM with call back number and indication MC msg would be sent. MC msg with results, RX info and education sent.

## 2023-04-28 NOTE — Progress Notes (Signed)
Pt read [facility] message from 04/26/23. No further actions needed.

## 2023-05-05 ENCOUNTER — Other Ambulatory Visit: Payer: Self-pay | Admitting: Family Medicine

## 2023-05-05 DIAGNOSIS — Z0000 Encounter for general adult medical examination without abnormal findings: Secondary | ICD-10-CM

## 2023-05-06 ENCOUNTER — Ambulatory Visit
Admission: RE | Admit: 2023-05-06 | Discharge: 2023-05-06 | Disposition: A | Discharge status: Home / Self Care | Payer: 59 | Source: Ambulatory Visit | Attending: Family Medicine | Admitting: Family Medicine

## 2023-05-06 DIAGNOSIS — Z0000 Encounter for general adult medical examination without abnormal findings: Secondary | ICD-10-CM

## 2024-01-18 ENCOUNTER — Other Ambulatory Visit (HOSPITAL_COMMUNITY)
Admission: RE | Admit: 2024-01-18 | Discharge: 2024-01-18 | Disposition: A | Discharge status: Home / Self Care | Source: Ambulatory Visit | Attending: Advanced Practice Midwife | Admitting: Advanced Practice Midwife

## 2024-01-18 ENCOUNTER — Ambulatory Visit (INDEPENDENT_AMBULATORY_CARE_PROVIDER_SITE_OTHER)

## 2024-01-18 VITALS — BP 124/79 | HR 69

## 2024-01-18 DIAGNOSIS — N898 Other specified noninflammatory disorders of vagina: Secondary | ICD-10-CM | POA: Insufficient documentation

## 2024-01-18 DIAGNOSIS — R399 Unspecified symptoms and signs involving the genitourinary system: Secondary | ICD-10-CM

## 2024-01-18 LAB — POCT URINALYSIS DIPSTICK
Bilirubin, UA: NEGATIVE
Blood, UA: NEGATIVE
Glucose, UA: NEGATIVE
Ketones, UA: NEGATIVE
Nitrite, UA: POSITIVE
Protein, UA: NEGATIVE
Spec Grav, UA: 1.02 (ref 1.010–1.025)
Urobilinogen, UA: 0.2 U/dL
pH, UA: 6 (ref 5.0–8.0)

## 2024-01-18 MED ORDER — NITROFURANTOIN MONOHYD MACRO 100 MG PO CAPS: 100.0000 mg | ORAL_CAPSULE | Freq: Two times a day (BID) | ORAL | 0 refills | Status: DC

## 2024-01-18 NOTE — Progress Notes (Signed)
 SUBJECTIVE:  53 y.o. female complains of clear vaginal discharge for 1 week(s) and dysuria with cloudy, malodorous urine. She reports low back pain and mild cramping as well. Denies abnormal vaginal bleeding or significant pelvic pain or fever.Denies history of known exposure to STD.  Patient's last menstrual period was 11/21/2023 (approximate).  OBJECTIVE:  She appears alert, well appearing, in no apparent distress Urine dipstick: positive for nitrates and positive for leukocytes.  ASSESSMENT:  Vaginal Discharge  Dysuria    PLAN:  GC, chlamydia, trichomonas, BVAG, CVAG probe and urine culture sent to lab. Treatment: To be determined once lab results are received. Prescription for macrobid  sent to pharmacy. ROV prn if symptoms persist or worsen.

## 2024-01-19 LAB — CERVICOVAGINAL ANCILLARY ONLY
Bacterial Vaginitis (gardnerella): POSITIVE — AB
Candida Glabrata: NEGATIVE
Candida Vaginitis: NEGATIVE
Chlamydia: NEGATIVE
Comment: NEGATIVE
Comment: NEGATIVE
Comment: NEGATIVE
Comment: NEGATIVE
Comment: NEGATIVE
Comment: NORMAL
Neisseria Gonorrhea: NEGATIVE
Trichomonas: NEGATIVE

## 2024-01-22 LAB — URINE CULTURE

## 2024-01-31 ENCOUNTER — Other Ambulatory Visit: Payer: Self-pay

## 2024-01-31 DIAGNOSIS — N3000 Acute cystitis without hematuria: Secondary | ICD-10-CM

## 2024-01-31 MED ORDER — CEPHALEXIN 500 MG PO CAPS: 500.0000 mg | ORAL_CAPSULE | Freq: Three times a day (TID) | ORAL | 0 refills | Status: AC

## 2024-03-08 ENCOUNTER — Other Ambulatory Visit: Payer: Self-pay | Admitting: Family Medicine

## 2024-03-08 DIAGNOSIS — D500 Iron deficiency anemia secondary to blood loss (chronic): Secondary | ICD-10-CM

## 2024-07-03 ENCOUNTER — Other Ambulatory Visit (HOSPITAL_COMMUNITY)
Admission: RE | Admit: 2024-07-03 | Discharge: 2024-07-03 | Disposition: A | Source: Ambulatory Visit | Attending: Obstetrics | Admitting: Obstetrics

## 2024-07-03 ENCOUNTER — Ambulatory Visit: Payer: Self-pay | Admitting: Obstetrics

## 2024-07-03 ENCOUNTER — Encounter: Payer: Self-pay | Admitting: Obstetrics

## 2024-07-03 VITALS — BP 127/77 | HR 65 | Ht 63.0 in | Wt 182.8 lb

## 2024-07-03 DIAGNOSIS — N898 Other specified noninflammatory disorders of vagina: Secondary | ICD-10-CM | POA: Insufficient documentation

## 2024-07-03 DIAGNOSIS — N390 Urinary tract infection, site not specified: Secondary | ICD-10-CM | POA: Diagnosis not present

## 2024-07-03 DIAGNOSIS — N944 Primary dysmenorrhea: Secondary | ICD-10-CM

## 2024-07-03 DIAGNOSIS — E66811 Obesity, class 1: Secondary | ICD-10-CM | POA: Diagnosis not present

## 2024-07-03 DIAGNOSIS — Z01419 Encounter for gynecological examination (general) (routine) without abnormal findings: Secondary | ICD-10-CM

## 2024-07-03 DIAGNOSIS — Z1239 Encounter for other screening for malignant neoplasm of breast: Secondary | ICD-10-CM

## 2024-07-03 DIAGNOSIS — R300 Dysuria: Secondary | ICD-10-CM | POA: Diagnosis not present

## 2024-07-03 DIAGNOSIS — N939 Abnormal uterine and vaginal bleeding, unspecified: Secondary | ICD-10-CM | POA: Diagnosis not present

## 2024-07-03 DIAGNOSIS — D251 Intramural leiomyoma of uterus: Secondary | ICD-10-CM

## 2024-07-03 DIAGNOSIS — N882 Stricture and stenosis of cervix uteri: Secondary | ICD-10-CM | POA: Diagnosis not present

## 2024-07-03 DIAGNOSIS — N946 Dysmenorrhea, unspecified: Secondary | ICD-10-CM

## 2024-07-03 LAB — POCT URINALYSIS DIPSTICK
Blood, UA: NEGATIVE
Glucose, UA: NEGATIVE
Ketones, UA: NEGATIVE
Nitrite, UA: POSITIVE
Protein, UA: POSITIVE — AB
Spec Grav, UA: 1.015
Urobilinogen, UA: 0.2 U/dL
pH, UA: 6

## 2024-07-03 MED ORDER — IBUPROFEN 800 MG PO TABS: ORAL_TABLET | ORAL | 2 refills | Status: AC

## 2024-07-03 MED ORDER — CEPHALEXIN 500 MG PO CAPS: 500.0000 mg | ORAL_CAPSULE | Freq: Three times a day (TID) | ORAL | 0 refills | Status: AC

## 2024-07-03 MED ORDER — FLUCONAZOLE 150 MG PO TABS: 150.0000 mg | ORAL_TABLET | Freq: Once | ORAL | 0 refills | Status: AC

## 2024-07-03 NOTE — Progress Notes (Unsigned)
 Pt presents for vaginal irration and irregular periods. Pt period for christmas lasted 2 weeks with dark brown spotting. Pt has no other questions or concerns at this time.

## 2024-07-03 NOTE — Progress Notes (Unsigned)
 "  Subjective:        Sherry Blair is a 54 y.o. female here for a routine exam.  Current complaints: Vaginal irritation and recurrent UTI's.    Personal health questionnaire:  Is patient [name], have a family history of breast and/or ovarian cancer: no Is there a family history of uterine cancer diagnosed at age < 40, gastrointestinal cancer, urinary tract cancer, family member who is a Personnel Officer syndrome-associated carrier: no Is the patient overweight and hypertensive, family history of diabetes, personal history of gestational diabetes, preeclampsia or PCOS: yes Is patient over 37, have PCOS,  family history of premature CHD under age 28, diabetes, smoke, have hypertension or peripheral artery disease:  no At any time, has a partner hit, kicked or otherwise hurt or frightened you?: no Over the past 2 weeks, have you felt down, depressed or hopeless?: no Over the past 2 weeks, have you felt little interest or pleasure in doing things?:no   Gynecologic History Patient's last menstrual period was 06/10/2024 (exact date). Contraception: none Last pap: 2023. Results were: normal Last mammogram: 2024. Results were: normal  Obstetric History OB History  Gravida Para Term Preterm AB Living  4 2 2  2 2   SAB IAB Ectopic Multiple Live Births   1 1  2     # Outcome Date GA Lbr Len/2nd Weight Sex Type Anes PTL Lv  4 Ectopic 2011 [redacted]w[redacted]d       DEC  3 Term 09/19/97 [redacted]w[redacted]d  7 lb 8 oz (3.402 kg) M Vag-Spont EPI  LIV  2 Term 08/24/96 [redacted]w[redacted]d  7 lb 6 oz (3.345 kg) F Vag-Spont EPI  LIV  1 IAB         DEC    Past Medical History:  Diagnosis Date   Anemia    low iron and fibroids   Chronic kidney disease 09/2018   Acute kidney failure   Fibroids    GERD (gastroesophageal reflux disease)    History of ectopic pregnancy 07/20/2009   Infertility, female    Leiomyoma of uterus    Vitamin D  deficiency     Past Surgical History:  Procedure Laterality Date   DX LAPAROSCOPY/   EXPLORATORY LAPARTOMY/ RIGHT SALPINGECTOMY/ WEDGE RESECTION FO THE UTERINE CORNU ON THE RIGHT , ECTOPIC PREGNANCY  07-20-2009    dr rogelio  [facility]   GASTRIC BYPASS     IR 3D INDEPENDENT WKST  03/06/2021   IR ANGIOGRAM PELVIS SELECTIVE OR SUPRASELECTIVE  03/06/2021   IR ANGIOGRAM SELECTIVE EACH ADDITIONAL VESSEL  03/06/2021   IR ANGIOGRAM SELECTIVE EACH ADDITIONAL VESSEL  03/06/2021   IR EMBO TUMOR ORGAN ISCHEMIA INFARCT INC GUIDE ROADMAPPING  03/06/2021   IR RADIOLOGIST EVAL & MGMT  12/10/2020   IR RADIOLOGIST EVAL & MGMT  04/03/2021   IR US  GUIDE VASC ACCESS RIGHT  03/06/2021    Current Medications[1] Allergies[2]  Social History   Tobacco Use   Smoking status: Never   Smokeless tobacco: Never  Substance Use Topics   Alcohol use: Yes    Alcohol/week: 0.0 standard drinks of alcohol    Comment: socially    Family History  Problem Relation Age of Onset   Hyperlipidemia Mother    Lupus Mother    Hypertension Father    Diabetes Father    Obesity Father    Hypertension Brother       Review of Systems  Constitutional: negative for fatigue and weight loss Respiratory: negative for cough and wheezing Cardiovascular: negative for  chest pain, fatigue and palpitations Gastrointestinal: negative for abdominal pain and change in bowel habits Musculoskeletal:negative for myalgias Neurological: negative for gait problems and tremors Behavioral/Psych: negative for abusive relationship, depression Endocrine: negative for temperature intolerance    Genitourinary: positive for abnormal periods, vaginal discharge and urinary frequency and burning.  negative for abnormal menstrual periods, genital lesions, hot flashes, sexual problems  Integument/breast: negative for breast lump, breast tenderness, nipple discharge and skin lesion(s)    Objective:       BP 127/77   Pulse 65   Ht 5' 3 (1.6 m)   Wt 182 lb 12.8 oz (82.9 kg)   LMP 06/10/2024 (Exact Date)   BMI 32.38 kg/m  General:    Alert and no distress  Skin:   no rash or abnormalities  Lungs:   clear to auscultation bilaterally  Heart:   regular rate and rhythm, S1, S2 normal, no murmur, click, rub or gallop  Breasts:   normal without suspicious masses, skin or nipple changes or axillary nodes  Abdomen:  normal findings: no organomegaly, soft, non-tender and no hernia  Pelvis:  External genitalia: normal general appearance Urinary system: urethral meatus normal and bladder without fullness, nontender Vaginal: normal without tenderness, induration or masses Cervix: normal appearance Adnexa: normal bimanual exam Uterus: anteverted and non-tender, normal size   Lab Review Urine pregnancy test Labs reviewed yes Radiologic studies reviewed yes MRI Abdomen and Pelvis :   MR PELVIS W WO CONTRAST (Accession [id number]) (Order [id number]) Imaging Date: 09/24/2020 Department: [facility] MRI Released By: Adline Richerd SAUNDERS, RT Authorizing: Marilynne Rosaline SAILOR, MD   Exam Status  Status  Final [99]   PACS Intelerad Image Link   Show images for MR PELVIS W WO CONTRAST Related Results   MR Abdomen W Wo Contrast Final result 09/24/2020 5:41 PM        CLINICAL DATA:  Symptomatic fibroids. Elevated postvoid residual. Hydronephrosis.  EXAM: MRI ABDOMEN AND PELVIS WITHOUT AND WITH CONTRAST  TECHNIQUE: Multiplanar multisequence MR imaging of the abdomen and pelvis was performed both before and after the administration of intravenous contrast. ...        Study Result  Narrative & Impression  CLINICAL DATA:  Symptomatic fibroids. Elevated postvoid residual. Hydronephrosis.   EXAM: MRI ABDOMEN AND PELVIS WITHOUT AND WITH CONTRAST   TECHNIQUE: Multiplanar multisequence MR imaging of the abdomen and pelvis was performed both before and after the administration of intravenous contrast.   CONTRAST:  9mL GADAVIST  GADOBUTROL  1 MMOL/ML IV SOLN   COMPARISON:  None   FINDINGS: COMBINED FINDINGS  FOR BOTH MR ABDOMEN AND PELVIS   Lower Chest: No acute findings.   Hepatobiliary: No hepatic masses identified. Gallbladder is unremarkable. No evidence of biliary ductal dilatation.   Pancreas:  No mass or inflammatory changes.   Spleen: Within normal limits in size and appearance.   Adrenals/Urinary Tract: No masses identified. Two small right renal sinus cysts are noted, however there is no evidence hydronephrosis or ureteral dilatation. Urinary bladder is normal in appearance.   Stomach/Bowel: No evidence of obstruction, inflammatory process or abnormal fluid collections.   Vascular/Lymphatic: No pathologically enlarged lymph nodes. No abdominal aortic aneurysm.   Reproductive:   -- Uterus: Measures 15.4 x 11.3 x 11.5 cm (volume = 1050 cm^3). Multiple uterine fibroids are seen which involve the uterus diffusely. These range in size from less than 1 cm, to the largest in the posterior uterine corpus measuring 9.9 cm in maximum diameter.   --  Intracavitary fibroids:  None.   -- Pedunculated fibroids: None.   -- Fibroid contrast enhancement: All fibroids show contrast enhancement, without significant degeneration/devascularization.   -- Right ovary:  Appears normal.  No mass identified.   -- Left ovary:  Appears normal.  No mass identified.   Other:  Tiny amount of free fluid noted in the right adnexa.   Musculoskeletal:  No suspicious bone lesions identified.   IMPRESSION: Diffuse uterine involvement by multiple uterine fibroids, largest measuring 9.9 cm. No intracavitary or pedunculated fibroids identified.   Normal appearance of both ovaries. No adnexal mass identified.   Two small right renal sinus cysts noted. No evidence of hydroureteronephrosis. Normal appearance of bladder.     Electronically Signed   By: Norleen DELENA Kil M.D.   On: 09/25/2020 09:00      I have spent a total of 20 minutes of face-to-face time, excluding clinical staff time, reviewing  notes and preparing to see patient, ordering tests and/or medications, and counseling the patient.   Assessment:    1. Encounter for gynecological examination with Papanicolaou smear of cervix (Primary) Rx: - Cytology - PAP( [place])  2. Intramural leiomyoma of uterus Rx: - US  PELVIC COMPLETE WITH TRANSVAGINAL; Future  3. Primary dysmenorrhea Rx: - ibuprofen  (ADVIL ) 800 MG tablet; TAKE 1 TABLET BY MOUTH EVERY 8 HOURS START BEFORE PERIOD UNTIL PERIOD ENDS, MONTHLY  Dispense: 30 tablet; Refill: 2  4. Vaginal discharge Rx: - Cervicovaginal ancillary only( [place]) - fluconazole  (DIFLUCAN ) 150 MG tablet; Take 1 tablet (150 mg total) by mouth once for 1 dose.  Dispense: 1 tablet; Refill: 0  5. Abnormal uterine bleeding (AUB) Rx: - US  PELVIC COMPLETE WITH TRANSVAGINAL; Future  6. Cervical os stenosis  7. Dysuria Rx: - POCT Urinalysis Dipstick - cephALEXin  (KEFLEX ) 500 MG capsule; Take 1 capsule (500 mg total) by mouth 3 (three) times daily.  Dispense: 21 capsule; Refill: 0  8. Recurrent UTI (urinary tract infection) Rx: - Urine Culture - Ambulatory referral to Urogynecology  9. Screening breast examination Rx: - MM 3D SCREENING MAMMOGRAM BILATERAL BREAST; Future  10. Obesity (BMI 30.0-34.9) - weight reduction with the aid of dietary changes, exercise and behavioral modification recommended     Plan:    Education reviewed: calcium  supplements, depression evaluation, low fat, low cholesterol diet, safe sex/STD prevention, self breast exams, and weight bearing exercise. Contraception: none. Mammogram ordered. Follow up in: 3 months.   Meds ordered this encounter  Medications   cephALEXin  (KEFLEX ) 500 MG capsule    Sig: Take 1 capsule (500 mg total) by mouth 3 (three) times daily.    Dispense:  21 capsule    Refill:  0   fluconazole  (DIFLUCAN ) 150 MG tablet    Sig: Take 1 tablet (150 mg total) by mouth once for 1 dose.    Dispense:  1 tablet    Refill:  0    ibuprofen  (ADVIL ) 800 MG tablet    Sig: TAKE 1 TABLET BY MOUTH EVERY 8 HOURS START BEFORE PERIOD UNTIL PERIOD ENDS, MONTHLY    Dispense:  30 tablet    Refill:  2   Orders Placed This Encounter  Procedures   Urine Culture   MM 3D SCREENING MAMMOGRAM BILATERAL BREAST    [facility] #[id number] 05/06/2023 BCG/ NO PROBLEMS/ NO NEEDS NO BR SXS/ TOMO LC [place] [facility]     Standing Status:   Future    Expiration Date:   07/03/2025    Reason for Exam (SYMPTOM  OR DIAGNOSIS  REQUIRED):   Needs routine screening    Is the patient pregnant?:   No    Preferred imaging location?:   [facility]   US  PELVIC COMPLETE WITH TRANSVAGINAL    Standing Status:   Future    Expiration Date:   07/03/2025    Reason for Exam (SYMPTOM  OR DIAGNOSIS REQUIRED):   AUB.  Perimenopause.    Preferred imaging location?:   [facility]   Ambulatory referral to Urogynecology    Referral Priority:   Routine    Referral Type:   Consultation    Referral Reason:   Specialty Services Required    Requested Specialty:   Urology    Number of Visits Requested:   1   POCT Urinalysis Dipstick    CARLIN RONAL CENTERS, MD, FACOG Attending Obstetrician & Gynecologist, [facility] for [facility], [facility], [facility] 07/03/2024     [1]  Current Outpatient Medications:    Ascorbic Acid  (VITAMIN C ) 100 MG tablet, Take 100 mg by mouth daily., Disp: , Rfl:    Biotin 1 MG CAPS, Take by mouth., Disp: , Rfl:    cephALEXin  (KEFLEX ) 500 MG capsule, Take 1 capsule (500 mg total) by mouth 3 (three) times daily., Disp: 21 capsule, Rfl: 0   Cholecalciferol (VITAMIN D ) 50 MCG (2000 UT) CAPS, Take by mouth., Disp: , Rfl:    ferrous sulfate  325 (65 FE) MG tablet, TAKE 1 TABLET BY MOUTH EVERY OTHER DAY, Disp: 15 tablet, Rfl: 11   HYDROcodone -acetaminophen  (NORCO/VICODIN) 5-325 MG tablet, Take 1-2 tablets by mouth every 4 (four) hours as needed for moderate pain., Disp: 20 tablet, Rfl: 0   Magnesium (MAGNESIUM  COMPLEX HIGH POTENCY) 250 MG CAPS, Take by mouth., Disp: , Rfl:    WEGOVY 2.4 MG/0.75ML SOAJ, Inject into the skin., Disp: , Rfl:    cephALEXin  (KEFLEX ) 500 MG capsule, Take 1 capsule (500 mg total) by mouth 3 (three) times daily. (Patient not taking: Reported on 07/03/2024), Disp: 21 capsule, Rfl: 0   clotrimazole  (LOTRIMIN ) 1 % cream, Apply 1 application. topically 2 (two) times daily. Apply externally only. (Patient not taking: Reported on 07/03/2024), Disp: 85 g, Rfl: 2   fluconazole  (DIFLUCAN ) 200 MG tablet, Take 1 tablet (200 mg total) by mouth every 3 (three) days. (Patient not taking: Reported on 07/03/2024), Disp: 3 tablet, Rfl: 2   ibuprofen  (ADVIL ) 800 MG tablet, TAKE 1 TABLET BY MOUTH EVERY 8 HOURS START BEFORE PERIOD UNTIL PERIOD ENDS, MONTHLY, Disp: 30 tablet, Rfl: 2   metroNIDAZOLE  (FLAGYL ) 500 MG tablet, Take 1 tablet (500 mg total) by mouth 2 (two) times daily. (Patient not taking: Reported on 07/03/2024), Disp: 14 tablet, Rfl: 2   metroNIDAZOLE  (FLAGYL ) 500 MG tablet, Take 1 tablet (500 mg total) by mouth 2 (two) times daily. (Patient not taking: Reported on 07/03/2024), Disp: 14 tablet, Rfl: 0   promethazine  (PHENERGAN ) 25 MG tablet, Take 1 tablet (25 mg total) by mouth every 8 (eight) hours as needed for nausea. (Patient not taking: Reported on 07/03/2024), Disp: 20 tablet, Rfl: 0   terconazole  (TERAZOL 3 ) 0.8 % vaginal cream, Place 1 applicator vaginally at bedtime. (Patient not taking: Reported on 07/03/2024), Disp: 20 g, Rfl: 0   Vitamin D , Ergocalciferol , (DRISDOL ) 1.25 MG (50000 UNIT) CAPS capsule, Take 1 capsule (50,000 Units total) by mouth every 7 (seven) days. (Patient not taking: Reported on 07/03/2024), Disp: 4 capsule, Rfl: 0 [2] No Known Allergies  "

## 2024-07-04 ENCOUNTER — Ambulatory Visit: Payer: Self-pay | Admitting: Obstetrics

## 2024-07-04 LAB — CERVICOVAGINAL ANCILLARY ONLY
Bacterial Vaginitis (gardnerella): POSITIVE — AB
Candida Glabrata: NEGATIVE
Candida Vaginitis: NEGATIVE
Chlamydia: NEGATIVE
Comment: NEGATIVE
Comment: NEGATIVE
Comment: NEGATIVE
Comment: NEGATIVE
Comment: NEGATIVE
Comment: NORMAL
Neisseria Gonorrhea: NEGATIVE
Trichomonas: NEGATIVE

## 2024-07-04 NOTE — Telephone Encounter (Signed)
-----   Message from Carlin Centers, MD sent at 07/04/2024  8:25 AM EST ----- Keflex  Rx for UTI.

## 2024-07-04 NOTE — Telephone Encounter (Signed)
 Spoke with patient. Informed her that Dr. Rudy sent in Keflex  prescription. Patient reports she is taking medication as prescribed. Urine culture results are still pending. Patient verbalized understanding.

## 2024-07-05 ENCOUNTER — Other Ambulatory Visit: Payer: Self-pay

## 2024-07-05 LAB — CYTOLOGY - PAP
Adequacy: ABSENT
Comment: NEGATIVE
Diagnosis: NEGATIVE
Diagnosis: REACTIVE
High risk HPV: NEGATIVE

## 2024-07-05 MED ORDER — METRONIDAZOLE 500 MG PO TABS: 500.0000 mg | ORAL_TABLET | Freq: Two times a day (BID) | ORAL | 0 refills | Status: AC

## 2024-07-06 LAB — URINE CULTURE

## 2024-07-07 ENCOUNTER — Encounter (HOSPITAL_COMMUNITY): Payer: Self-pay

## 2024-07-07 ENCOUNTER — Ambulatory Visit (HOSPITAL_COMMUNITY)
Admission: RE | Admit: 2024-07-07 | Discharge: 2024-07-07 | Disposition: A | Discharge status: Home / Self Care | Source: Ambulatory Visit | Attending: Obstetrics | Admitting: Obstetrics

## 2024-07-07 DIAGNOSIS — D251 Intramural leiomyoma of uterus: Secondary | ICD-10-CM | POA: Insufficient documentation

## 2024-07-07 DIAGNOSIS — N939 Abnormal uterine and vaginal bleeding, unspecified: Secondary | ICD-10-CM | POA: Diagnosis present

## 2024-07-12 ENCOUNTER — Ambulatory Visit

## 2024-07-20 ENCOUNTER — Ambulatory Visit
Admission: RE | Admit: 2024-07-20 | Discharge: 2024-07-20 | Disposition: A | Discharge status: Home / Self Care | Source: Ambulatory Visit | Attending: Obstetrics | Admitting: Obstetrics

## 2024-07-20 DIAGNOSIS — Z1239 Encounter for other screening for malignant neoplasm of breast: Secondary | ICD-10-CM
